# Patient Record
Sex: Female | Born: 1955
Health system: Southern US, Community
[De-identification: ages and names within clinical notes are randomized; demographics above are authoritative.]

## PROBLEM LIST (undated history)

## (undated) DIAGNOSIS — Z5189 Encounter for other specified aftercare: Secondary | ICD-10-CM

## (undated) DIAGNOSIS — R202 Paresthesia of skin: Secondary | ICD-10-CM

## (undated) DIAGNOSIS — M1990 Unspecified osteoarthritis, unspecified site: Secondary | ICD-10-CM

## (undated) DIAGNOSIS — J449 Chronic obstructive pulmonary disease, unspecified: Secondary | ICD-10-CM

## (undated) DIAGNOSIS — I10 Essential (primary) hypertension: Secondary | ICD-10-CM

## (undated) DIAGNOSIS — R200 Anesthesia of skin: Secondary | ICD-10-CM

## (undated) DIAGNOSIS — F419 Anxiety disorder, unspecified: Secondary | ICD-10-CM

## (undated) DIAGNOSIS — L309 Dermatitis, unspecified: Secondary | ICD-10-CM

## (undated) DIAGNOSIS — D649 Anemia, unspecified: Secondary | ICD-10-CM

## (undated) DIAGNOSIS — F32A Depression, unspecified: Secondary | ICD-10-CM

## (undated) DIAGNOSIS — E785 Hyperlipidemia, unspecified: Secondary | ICD-10-CM

## (undated) DIAGNOSIS — F329 Major depressive disorder, single episode, unspecified: Secondary | ICD-10-CM

## (undated) HISTORY — PX: LIPOMA EXCISION: SHX5283

## (undated) HISTORY — DX: Anxiety disorder, unspecified: F41.9

## (undated) HISTORY — DX: Encounter for other specified aftercare: Z51.89

## (undated) HISTORY — PX: COLONOSCOPY: SHX174

## (undated) HISTORY — PX: UPPER GASTROINTESTINAL ENDOSCOPY: SHX188

## (undated) HISTORY — PX: POLYPECTOMY: SHX149

## (undated) HISTORY — DX: Hyperlipidemia, unspecified: E78.5

## (undated) HISTORY — PX: OTHER SURGICAL HISTORY: SHX169

## (undated) HISTORY — PX: ABDOMINAL HYSTERECTOMY: SHX81

## (undated) HISTORY — DX: Anemia, unspecified: D64.9

## (undated) HISTORY — DX: Depression, unspecified: F32.A

## (undated) HISTORY — PX: TIBIA FRACTURE SURGERY: SHX806

## (undated) HISTORY — PX: SALIVARY GLAND SURGERY: SHX768

## (undated) HISTORY — PX: FRACTURE SURGERY: SHX138

---
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
## 1998-06-23 ENCOUNTER — Emergency Department (HOSPITAL_COMMUNITY): Admission: EM | Admit: 1998-06-23 | Discharge: 1998-06-23 | Payer: Self-pay | Admitting: Emergency Medicine

## 1998-06-23 ENCOUNTER — Encounter: Payer: Self-pay | Admitting: Emergency Medicine

## 1998-06-27 ENCOUNTER — Encounter: Payer: Self-pay | Admitting: Orthopedic Surgery

## 1998-06-27 ENCOUNTER — Ambulatory Visit (HOSPITAL_COMMUNITY): Admission: RE | Admit: 1998-06-27 | Discharge: 1998-06-27 | Payer: Self-pay | Admitting: Orthopedic Surgery

## 1999-11-22 ENCOUNTER — Encounter: Payer: Self-pay | Admitting: Family Medicine

## 1999-11-22 ENCOUNTER — Encounter: Admission: RE | Admit: 1999-11-22 | Discharge: 1999-11-22 | Payer: Self-pay | Admitting: Family Medicine

## 1999-12-01 ENCOUNTER — Encounter: Payer: Self-pay | Admitting: Orthopedic Surgery

## 1999-12-01 ENCOUNTER — Inpatient Hospital Stay (HOSPITAL_COMMUNITY): Admission: EM | Admit: 1999-12-01 | Discharge: 1999-12-03 | Payer: Self-pay | Admitting: *Deleted

## 2000-11-13 ENCOUNTER — Other Ambulatory Visit: Admission: RE | Admit: 2000-11-13 | Discharge: 2000-11-13 | Payer: Self-pay | Admitting: Orthopedic Surgery

## 2001-01-14 ENCOUNTER — Encounter: Payer: Self-pay | Admitting: Family Medicine

## 2001-01-14 ENCOUNTER — Encounter: Admission: RE | Admit: 2001-01-14 | Discharge: 2001-01-14 | Payer: Self-pay | Admitting: Family Medicine

## 2001-10-21 ENCOUNTER — Encounter: Admission: RE | Admit: 2001-10-21 | Discharge: 2001-10-21 | Payer: Self-pay | Admitting: Internal Medicine

## 2001-10-21 ENCOUNTER — Encounter: Payer: Self-pay | Admitting: Internal Medicine

## 2002-04-06 ENCOUNTER — Encounter: Payer: Self-pay | Admitting: Internal Medicine

## 2002-04-06 ENCOUNTER — Encounter: Admission: RE | Admit: 2002-04-06 | Discharge: 2002-04-06 | Payer: Self-pay | Admitting: Internal Medicine

## 2002-10-30 ENCOUNTER — Encounter: Payer: Self-pay | Admitting: Emergency Medicine

## 2002-10-30 ENCOUNTER — Emergency Department (HOSPITAL_COMMUNITY): Admission: EM | Admit: 2002-10-30 | Discharge: 2002-10-30 | Payer: Self-pay | Admitting: Emergency Medicine

## 2003-04-10 ENCOUNTER — Encounter: Admission: RE | Admit: 2003-04-10 | Discharge: 2003-04-10 | Payer: Self-pay | Admitting: Family Medicine

## 2003-04-10 ENCOUNTER — Encounter: Payer: Self-pay | Admitting: Family Medicine

## 2003-12-14 ENCOUNTER — Other Ambulatory Visit: Admission: RE | Admit: 2003-12-14 | Discharge: 2003-12-14 | Payer: Self-pay | Admitting: Family Medicine

## 2004-01-22 ENCOUNTER — Encounter: Admission: RE | Admit: 2004-01-22 | Discharge: 2004-04-21 | Payer: Self-pay | Admitting: Family Medicine

## 2004-11-13 ENCOUNTER — Encounter: Admission: RE | Admit: 2004-11-13 | Discharge: 2004-11-13 | Payer: Self-pay | Admitting: Family Medicine

## 2004-12-23 ENCOUNTER — Emergency Department (HOSPITAL_COMMUNITY): Admission: EM | Admit: 2004-12-23 | Discharge: 2004-12-23 | Payer: Self-pay | Admitting: Emergency Medicine

## 2005-09-11 ENCOUNTER — Ambulatory Visit (HOSPITAL_COMMUNITY): Admission: RE | Admit: 2005-09-11 | Discharge: 2005-09-11 | Payer: Self-pay | Admitting: Orthopedic Surgery

## 2005-09-11 ENCOUNTER — Ambulatory Visit (HOSPITAL_BASED_OUTPATIENT_CLINIC_OR_DEPARTMENT_OTHER): Admission: RE | Admit: 2005-09-11 | Discharge: 2005-09-11 | Payer: Self-pay | Admitting: Orthopedic Surgery

## 2006-09-24 ENCOUNTER — Ambulatory Visit: Payer: Self-pay | Admitting: Family Medicine

## 2006-11-26 ENCOUNTER — Ambulatory Visit: Payer: Self-pay | Admitting: Family Medicine

## 2006-12-30 ENCOUNTER — Ambulatory Visit: Payer: Self-pay | Admitting: Family Medicine

## 2007-06-15 ENCOUNTER — Ambulatory Visit: Payer: Self-pay | Admitting: Family Medicine

## 2007-08-26 ENCOUNTER — Ambulatory Visit: Payer: Self-pay | Admitting: Family Medicine

## 2007-09-20 ENCOUNTER — Encounter: Admission: RE | Admit: 2007-09-20 | Discharge: 2007-09-20 | Payer: Self-pay | Admitting: Family Medicine

## 2007-10-27 ENCOUNTER — Ambulatory Visit: Payer: Self-pay | Admitting: Family Medicine

## 2007-11-01 ENCOUNTER — Ambulatory Visit: Payer: Self-pay | Admitting: Family Medicine

## 2007-12-15 ENCOUNTER — Encounter: Admission: RE | Admit: 2007-12-15 | Discharge: 2007-12-15 | Payer: Self-pay | Admitting: General Surgery

## 2007-12-16 ENCOUNTER — Ambulatory Visit (HOSPITAL_BASED_OUTPATIENT_CLINIC_OR_DEPARTMENT_OTHER): Admission: RE | Admit: 2007-12-16 | Discharge: 2007-12-16 | Payer: Self-pay | Admitting: General Surgery

## 2007-12-16 ENCOUNTER — Encounter (INDEPENDENT_AMBULATORY_CARE_PROVIDER_SITE_OTHER): Payer: Self-pay | Admitting: General Surgery

## 2008-04-11 ENCOUNTER — Ambulatory Visit: Payer: Self-pay | Admitting: Family Medicine

## 2008-07-20 ENCOUNTER — Ambulatory Visit: Payer: Self-pay | Admitting: Family Medicine

## 2008-10-05 ENCOUNTER — Ambulatory Visit: Payer: Self-pay | Admitting: Family Medicine

## 2010-05-03 ENCOUNTER — Ambulatory Visit: Payer: Self-pay | Admitting: Family Medicine

## 2010-10-06 ENCOUNTER — Encounter: Payer: Self-pay | Admitting: Family Medicine

## 2010-10-13 ENCOUNTER — Emergency Department (HOSPITAL_COMMUNITY)
Admission: EM | Admit: 2010-10-13 | Discharge: 2010-10-14 | Payer: Self-pay | Source: Home / Self Care | Admitting: Emergency Medicine

## 2010-10-14 LAB — CBC
HCT: 43.3 % (ref 36.0–46.0)
Hemoglobin: 15 g/dL (ref 12.0–15.0)
MCH: 33.8 pg (ref 26.0–34.0)
MCHC: 34.6 g/dL (ref 30.0–36.0)
MCV: 97.5 fL (ref 78.0–100.0)
Platelets: 225 10*3/uL (ref 150–400)
RBC: 4.44 MIL/uL (ref 3.87–5.11)
RDW: 13.2 % (ref 11.5–15.5)
WBC: 7 10*3/uL (ref 4.0–10.5)

## 2010-10-14 LAB — BASIC METABOLIC PANEL
BUN: 7 mg/dL (ref 6–23)
CO2: 24 mEq/L (ref 19–32)
Calcium: 9.5 mg/dL (ref 8.4–10.5)
Chloride: 103 mEq/L (ref 96–112)
Creatinine, Ser: 0.82 mg/dL (ref 0.4–1.2)
GFR calc Af Amer: >60 mL/min (ref >60)
GFR calc non Af Amer: >60 mL/min (ref >60)
Glucose, Bld: 111 mg/dL — ABNORMAL HIGH (ref 70–99)
Potassium: 3.6 mEq/L (ref 3.5–5.1)
Sodium: 137 mEq/L (ref 135–145)

## 2010-10-14 LAB — DIFFERENTIAL
Basophils Absolute: 0.1 10*3/uL (ref 0.0–0.1)
Basophils Relative: 1 % (ref 0–1)
Eosinophils Absolute: 0.2 10*3/uL (ref 0.0–0.7)
Eosinophils Relative: 2 % (ref 0–5)
Lymphocytes Relative: 49 % — ABNORMAL HIGH (ref 12–46)
Lymphs Abs: 3.5 10*3/uL (ref 0.7–4.0)
Monocytes Absolute: 0.5 10*3/uL (ref 0.1–1.0)
Monocytes Relative: 7 % (ref 3–12)
Neutro Abs: 2.9 10*3/uL (ref 1.7–7.7)
Neutrophils Relative %: 41 % — ABNORMAL LOW (ref 43–77)

## 2010-10-14 LAB — BRAIN NATRIURETIC PEPTIDE: Pro B Natriuretic peptide (BNP): <30 pg/mL (ref 0.0–100.0)

## 2010-10-14 LAB — TROPONIN I: Troponin I: 0.02 ng/mL (ref 0.00–0.06)

## 2010-10-14 LAB — CK TOTAL AND CKMB (NOT AT ARMC)
CK, MB: 1.4 ng/mL (ref 0.3–4.0)
Relative Index: 1.3 (ref 0.0–2.5)
Total CK: 105 U/L (ref 7–177)

## 2011-01-28 NOTE — Op Note (Signed)
Meredith Church, Meredith Church               ACCOUNT NO.:  [id number]   MEDICAL RECORD NO.:  [id number]          PATIENT TYPE:  AMB   LOCATION:  DSC                          FACILITY:  [facility]   PHYSICIAN:  Adolph Pollack, M.D.DATE OF BIRTH:  [birth year]   DATE OF PROCEDURE:  12/16/2007  DATE OF DISCHARGE:                               OPERATIVE REPORT   PREOPERATIVE DIAGNOSIS:  Left back soft tissue mass.   POSTOPERATIVE DIAGNOSIS:  Left back soft tissue mass (13 cm x 9 cm).   PROCEDURE:  1. Excision of soft tissue mass left back.  2. Two-layer closure of 13-cm wound.   SURGEON:  Adolph Pollack, M.D.   ANESTHESIA:  General plus Marcaine for local.   INDICATIONS:  This is a 55 year old female who I had seen in May of  2008.  At that time, she had a 9-cm x 9-cm soft tissue mass in the left  upper back that was somewhat symptomatic.  I offered to remove it at  that time.  She then came back in February of this year, wanting  removal, and she has been scheduled for it.  We have discussed the  procedure and risks preoperatively.   TECHNIQUE:  She is seen in the holding area and the mass marked with my  initials.  She was then brought to the operating room and was given a  general anesthetic while supine on the stretcher.  She was then placed  prone on the operating table, and the padding was used at the  appropriate points.  The left upper back area and mid-back were  sterilely prepped and draped.  A transverse incision was made directly  over the mesh and the skin and subcutaneous tissue.  I then used sharp  dissection and electrocautery to free up the soft tissue mass which had  multiple attachments to the subcutaneous tissue.  It went as deep as  some of the fascia over the muscle.  I continued in this fashion in all  directions until I freed the mass up and was able to excise it.  It  measured 13 cm x 9 cm and looked to be lipomatous.   Following this, I inspected the wound and  controlled bleeding with  electrocautery.  I then injected with 0.5% Marcaine solution deep and  superficially into the wound.   The wound was then closed in two layers with a running 3-0 Vicryl suture  through the deep fascial and subcutaneous tissue layer.  The skin was  closed with 3-0 Monocryl subcuticular stitch.  Steri-Strips and a  sterile dressing were applied.   She tolerated the procedure well without any apparent complications and  was taken to recovery in satisfactory condition.      Adolph Pollack, M.D.  Electronically Signed     TJR/MEDQ  D:  12/16/2007  T:  12/16/2007  Job:  [id number]

## 2011-01-31 NOTE — Op Note (Signed)
[name], Meredith Church               ACCOUNT NO.:  [id number]   MEDICAL RECORD NO.:  [id number]          PATIENT TYPE:  AMB   LOCATION:  DSC                          FACILITY:  [facility]   PHYSICIAN:  Cindee Salt, M.D.       DATE OF BIRTH:  Feb 05, 1956   DATE OF PROCEDURE:  09/11/2005  DATE OF DISCHARGE:                                 OPERATIVE REPORT   PREOPERATIVE DIAGNOSIS:  Carpal tunnel syndrome of right hand.   POSTOPERATIVE DIAGNOSIS:  Carpal tunnel syndrome of right hand.   OPERATION:  Decompression of right median nerve.   SURGEON:  Cindee Salt, M.D.   ASSISTANT:  Carolyne Fiscal, R.N.   ANESTHESIA:  General.   HISTORY:  The patient is a 55 year old female with history of carpal tunnel  syndrome. EMG nerve conduction is  positive.  This  has not responded to  conservative treatment.   PROCEDURE:  The patient is brought to the operating room, where a general  anesthetic was carried out without difficulty. She was prepped using  DuraPrep in the supine position, with the right arm free. The limb was  exsanguinated with an Esmarch bandage; tourniquet placed high on the arm was  inflated to 250 mmHg.  A longitudinal incision was made in the palm, carried  down through subcutaneous tissues. Bleeders were electrocauterized.  The  palmar fascia was split.  The superficial palmar arch identified.  The  flexor tendon to the ring and middle finger identified to the ulnar side of  the median nerve.  The carpal retinaculum was incised with sharp dissection.  The right angle and Sewall retractor were placed between skin and forearm  fascia. The fascia released for approximately 1.5 cm proximal to the wrist  crease.  Under direct vision, the canal was explored.  Persistent median  artery was present.  An area of compression to the nerve was apparent. No  further lesions were identified. The wound was irrigated. The skin was  closed with interrupted 5-0 nylon sutures. Sterile compressive dressing  and  splint was applied. The patient tolerated the procedure well, and was taken  to the recovery room for observation in satisfactory condition. She is  discharged home to return to the [facility] in 1 week on  Vicodin.           ______________________________  Cindee Salt, M.D.     GK/MEDQ  D:  09/11/2005  T:  09/11/2005  Job:  [id number]

## 2011-06-10 LAB — DIFFERENTIAL
Basophils Absolute: 0.1
Basophils Relative: 1
Eosinophils Absolute: 0.2
Eosinophils Relative: 3
Lymphocytes Relative: 45
Lymphs Abs: 3.3
Monocytes Absolute: 0.5
Monocytes Relative: 6
Neutro Abs: 3.3
Neutrophils Relative %: 46

## 2011-06-10 LAB — COMPREHENSIVE METABOLIC PANEL
ALT: 31
AST: 25
Albumin: 3.6
Alkaline Phosphatase: 59
BUN: 12
CO2: 29
Calcium: 9.4
Chloride: 104
Creatinine, Ser: 0.74
GFR calc Af Amer: >60
GFR calc non Af Amer: >60
Glucose, Bld: 120 — ABNORMAL HIGH
Potassium: 4.1
Sodium: 139
Total Bilirubin: 0.5
Total Protein: 7.3

## 2011-06-10 LAB — CBC
HCT: 37.7
Hemoglobin: 13
MCHC: 34.5
MCV: 95.7
Platelets: 255
RBC: 3.94
RDW: 14.4
WBC: 7.3

## 2013-09-10 ENCOUNTER — Encounter (HOSPITAL_COMMUNITY): Payer: Self-pay | Admitting: Emergency Medicine

## 2013-09-10 ENCOUNTER — Emergency Department (HOSPITAL_COMMUNITY)
Admission: EM | Admit: 2013-09-10 | Discharge: 2013-09-10 | Disposition: A | Discharge status: Home / Self Care | Payer: Self-pay | Attending: Emergency Medicine | Admitting: Emergency Medicine

## 2013-09-10 DIAGNOSIS — I10 Essential (primary) hypertension: Secondary | ICD-10-CM | POA: Insufficient documentation

## 2013-09-10 DIAGNOSIS — Z79899 Other long term (current) drug therapy: Secondary | ICD-10-CM | POA: Insufficient documentation

## 2013-09-10 DIAGNOSIS — M545 Low back pain, unspecified: Secondary | ICD-10-CM | POA: Insufficient documentation

## 2013-09-10 DIAGNOSIS — F17200 Nicotine dependence, unspecified, uncomplicated: Secondary | ICD-10-CM | POA: Insufficient documentation

## 2013-09-10 DIAGNOSIS — N390 Urinary tract infection, site not specified: Secondary | ICD-10-CM | POA: Insufficient documentation

## 2013-09-10 HISTORY — DX: Essential (primary) hypertension: I10

## 2013-09-10 LAB — URINE MICROSCOPIC-ADD ON

## 2013-09-10 LAB — URINALYSIS, ROUTINE W REFLEX MICROSCOPIC
Bilirubin Urine: NEGATIVE
Glucose, UA: NEGATIVE mg/dL
Ketones, ur: NEGATIVE mg/dL
Nitrite: NEGATIVE
Protein, ur: 100 mg/dL — AB
Specific Gravity, Urine: 1.02 (ref 1.005–1.030)
Urobilinogen, UA: 1 mg/dL (ref 0.0–1.0)
pH: 5.5 (ref 5.0–8.0)

## 2013-09-10 MED ORDER — CEPHALEXIN 500 MG PO CAPS: 500.0000 mg | ORAL_CAPSULE | Freq: Four times a day (QID) | ORAL | Status: DC

## 2013-09-10 MED ORDER — PHENAZOPYRIDINE HCL 200 MG PO TABS
200.0000 mg | ORAL_TABLET | Freq: Three times a day (TID) | ORAL | Status: DC
  Administered 2013-09-10: 200 mg via ORAL
  Filled 2013-09-10: qty 1

## 2013-09-10 MED ORDER — PHENAZOPYRIDINE HCL 200 MG PO TABS: 200.0000 mg | ORAL_TABLET | Freq: Three times a day (TID) | ORAL | Status: DC

## 2013-09-10 MED ORDER — LIDOCAINE HCL 1 % IJ SOLN
INTRAMUSCULAR | Status: AC
  Administered 2013-09-10: 2.1 mL
  Filled 2013-09-10: qty 20

## 2013-09-10 MED ORDER — CEFTRIAXONE SODIUM 1 G IJ SOLR
1.0000 g | Freq: Once | INTRAMUSCULAR | Status: AC
  Administered 2013-09-10: 1 g via INTRAMUSCULAR
  Filled 2013-09-10: qty 10

## 2013-09-10 NOTE — ED Notes (Signed)
Pt c/o hematuria and flank pain x a few days.  States that she has had a kidney infection before and it felt similar.

## 2013-09-10 NOTE — ED Provider Notes (Signed)
CSN: [id number]     Arrival date & time 09/10/13  1207 History   First MD Initiated Contact with Patient 09/10/13 1402     Chief Complaint  Patient presents with  . Hematuria    HPI Patient reports hematuria and dysuria over the past several days.  No nausea or vomiting.  She denies fever.  Her symptoms are mild in severity.  She reports mild low back pain but denies unilateral flank pain.  States a history of prior urinary tract infection and reports this feels very similar.  Nothing worsens or improves her symptoms.   Past Medical History  Diagnosis Date  . Hypertension    Past Surgical History  Procedure Laterality Date  . Tibia fracture surgery    . Abdominal hysterectomy     No family history on file. History  Substance Use Topics  . Smoking status: Current Some Day Smoker -- 0.01 packs/day    Types: Cigarettes  . Smokeless tobacco: Never Used  . Alcohol Use: Yes     Comment: Socially    OB History   Grav Para Term Preterm Abortions TAB SAB Ect Mult Living                 Review of Systems  All other systems reviewed and are negative.    Allergies  Review of patient's allergies indicates no known allergies.  Home Medications   Current Outpatient Rx  Name  Route  Sig  Dispense  Refill  . ALPRAZolam (XANAX) 0.5 MG tablet   Oral   Take 0.5 mg by mouth daily as needed for anxiety.         . Biotin 1000 MCG tablet   Oral   Take 2,000 mcg by mouth daily.         . L-FORMULA LYSINE HCL PO   Oral   Take 1 tablet by mouth daily.         Marland Kitchen lisinopril (PRINIVIL,ZESTRIL) 2.5 MG tablet   Oral   Take 2.5 mg by mouth daily.         . metoprolol succinate (TOPROL-XL) 50 MG 24 hr tablet   Oral   Take 50 mg by mouth daily. Take with or immediately following a meal.         . vitamin C (ASCORBIC ACID) 500 MG tablet   Oral   Take 500 mg by mouth daily.         . cephALEXin (KEFLEX) 500 MG capsule   Oral   Take 1 capsule (500 mg total) by mouth 4  (four) times daily.   28 capsule   0   . phenazopyridine (PYRIDIUM) 200 MG tablet   Oral   Take 1 tablet (200 mg total) by mouth 3 (three) times daily.   6 tablet   0    BP 138/79  Pulse 89  Temp(Src) 98.8 F (37.1 C) (Oral)  Resp 16  SpO2 97% Physical Exam  Nursing note and vitals reviewed. Constitutional: She is oriented to person, place, and time. She appears well-developed and well-nourished. No distress.  HENT:  Head: Normocephalic and atraumatic.  Eyes: EOM are normal.  Neck: Normal range of motion.  Cardiovascular: Normal rate, regular rhythm and normal heart sounds.   Pulmonary/Chest: Effort normal and breath sounds normal.  Abdominal: Soft. She exhibits no distension. There is no tenderness.  Musculoskeletal: Normal range of motion.  Neurological: She is alert and oriented to person, place, and time.  Skin: Skin is warm and  dry.  Psychiatric: She has a normal mood and affect. Judgment normal.    ED Course  Procedures (including critical care time) Labs Review Labs Reviewed  URINALYSIS, ROUTINE W REFLEX MICROSCOPIC - Abnormal; Notable for the following:    Color, Urine RED (*)    APPearance TURBID (*)    Hgb urine dipstick LARGE (*)    Protein, ur 100 (*)    Leukocytes, UA LARGE (*)    All other components within normal limits  URINE MICROSCOPIC-ADD ON - Abnormal; Notable for the following:    Squamous Epithelial / LPF FEW (*)    Bacteria, UA FEW (*)    All other components within normal limits  URINE CULTURE   Imaging Review No results found.  EKG Interpretation   None       MDM   1. Urinary tract infection    UTI without signs of pyelo. Dc home. Vitals normal. Return instructions given    Lyanne Co, MD 09/10/13 1435

## 2013-09-11 LAB — URINE CULTURE: Colony Count: 4000

## 2013-10-03 ENCOUNTER — Ambulatory Visit: Payer: 59 | Attending: Internal Medicine | Admitting: Internal Medicine

## 2013-10-03 ENCOUNTER — Other Ambulatory Visit: Payer: Self-pay | Admitting: Emergency Medicine

## 2013-10-03 ENCOUNTER — Other Ambulatory Visit: Payer: Self-pay

## 2013-10-03 ENCOUNTER — Encounter: Payer: Self-pay | Admitting: Internal Medicine

## 2013-10-03 VITALS — BP 172/116 | HR 93 | Temp 99.0°F | Resp 14 | Ht 64.0 in | Wt 193.6 lb

## 2013-10-03 DIAGNOSIS — I10 Essential (primary) hypertension: Secondary | ICD-10-CM | POA: Insufficient documentation

## 2013-10-03 DIAGNOSIS — E7800 Pure hypercholesterolemia, unspecified: Secondary | ICD-10-CM

## 2013-10-03 DIAGNOSIS — E785 Hyperlipidemia, unspecified: Secondary | ICD-10-CM | POA: Insufficient documentation

## 2013-10-03 LAB — POCT GLYCOSYLATED HEMOGLOBIN (HGB A1C): Hemoglobin A1C: 5.9

## 2013-10-03 LAB — CBC WITH DIFFERENTIAL/PLATELET
Basophils Absolute: 0 10*3/uL (ref 0.0–0.1)
Basophils Relative: 1 % (ref 0–1)
Eosinophils Absolute: 0.1 10*3/uL (ref 0.0–0.7)
Eosinophils Relative: 3 % (ref 0–5)
HCT: 41.7 % (ref 36.0–46.0)
Hemoglobin: 14.2 g/dL (ref 12.0–15.0)
Lymphocytes Relative: 46 % (ref 12–46)
Lymphs Abs: 2.2 10*3/uL (ref 0.7–4.0)
MCH: 34.1 pg — ABNORMAL HIGH (ref 26.0–34.0)
MCHC: 34.1 g/dL (ref 30.0–36.0)
MCV: 100.2 fL — ABNORMAL HIGH (ref 78.0–100.0)
Monocytes Absolute: 0.5 10*3/uL (ref 0.1–1.0)
Monocytes Relative: 10 % (ref 3–12)
Neutro Abs: 2 10*3/uL (ref 1.7–7.7)
Neutrophils Relative %: 40 % — ABNORMAL LOW (ref 43–77)
Platelets: 225 10*3/uL (ref 150–400)
RBC: 4.16 MIL/uL (ref 3.87–5.11)
RDW: 13.7 % (ref 11.5–15.5)
WBC: 4.8 10*3/uL (ref 4.0–10.5)

## 2013-10-03 MED ORDER — PRAVASTATIN SODIUM 40 MG PO TABS: 40.0000 mg | ORAL_TABLET | Freq: Every day | ORAL | Status: DC

## 2013-10-03 MED ORDER — METOPROLOL TARTRATE 50 MG PO TABS: 50.0000 mg | ORAL_TABLET | Freq: Two times a day (BID) | ORAL | Status: DC

## 2013-10-03 MED ORDER — METOPROLOL SUCCINATE ER 50 MG PO TB24: 50.0000 mg | ORAL_TABLET | Freq: Every day | ORAL | Status: DC

## 2013-10-03 MED ORDER — LISINOPRIL 20 MG PO TABS: 20.0000 mg | ORAL_TABLET | Freq: Every day | ORAL | Status: DC

## 2013-10-03 MED ORDER — METOPROLOL SUCCINATE ER 50 MG PO TB24
50.0000 mg | ORAL_TABLET | Freq: Every day | ORAL | Status: DC
Start: 2013-10-03 — End: 2013-10-03

## 2013-10-03 MED ORDER — ALPRAZOLAM 0.5 MG PO TABS: 0.5000 mg | ORAL_TABLET | Freq: Every day | ORAL | Status: DC | PRN

## 2013-10-03 NOTE — Progress Notes (Signed)
Pt is here to establish care. Requests some blood work. Pt needs assistance to keep BP regulated.

## 2013-10-03 NOTE — Progress Notes (Signed)
Patient ID: Meredith Church, female   DOB: 03/09/1956, 58 y.o.   MRN: [id number]   CC:  HPI: 58 year old female here to establish care. She has no history of hypertension, previously seen by the [facility]. She has been on metoprolol XL a little nd lisinopril for 2 years. She previously used to see Dr. Mardelle Matte has obesity and lost her insurance. She denies any ongoing chest pain shortness of breath. She status post hysterectomy her last Pap smear was 2 years ago She also has had dyslipidemia.  Social history she smokes occasionally, drinks socially, works at a [employer] and find a job very stressful  Family history positive for coronary artery disease and diabetes in her mother and her sister Father is deceased and she was 14 years old      No Known Allergies Past Medical History  Diagnosis Date  . Hypertension    Current Outpatient Prescriptions on File Prior to Visit  Medication Sig Dispense Refill  . Biotin 1000 MCG tablet Take 2,000 mcg by mouth daily.      . cephALEXin (KEFLEX) 500 MG capsule Take 1 capsule (500 mg total) by mouth 4 (four) times daily.  28 capsule  0  . L-FORMULA LYSINE HCL PO Take 1 tablet by mouth daily.      . phenazopyridine (PYRIDIUM) 200 MG tablet Take 1 tablet (200 mg total) by mouth 3 (three) times daily.  6 tablet  0  . vitamin C (ASCORBIC ACID) 500 MG tablet Take 500 mg by mouth daily.       No current facility-administered medications on file prior to visit.   History reviewed. No pertinent family history. History   Social History  . Marital Status: Single    Spouse Name: N/A    Number of Children: N/A  . Years of Education: N/A   Occupational History  . Not on file.   Social History Main Topics  . Smoking status: Current Some Day Smoker -- 0.01 packs/day    Types: Cigarettes  . Smokeless tobacco: Never Used  . Alcohol Use: Yes     Comment: Socially   . Drug Use: No  . Sexual Activity: Not on file   Other Topics Concern  .  Not on file   Social History Narrative  . No narrative on file    Review of Systems  Constitutional: Negative for fever, chills, diaphoresis, activity change, appetite change and fatigue.  HENT: Negative for ear pain, nosebleeds, congestion, facial swelling, rhinorrhea, neck pain, neck stiffness and ear discharge.   Eyes: Negative for pain, discharge, redness, itching and visual disturbance.  Respiratory: Negative for cough, choking, chest tightness, shortness of breath, wheezing and stridor.   Cardiovascular: Negative for chest pain, palpitations and leg swelling.  Gastrointestinal: Negative for abdominal distention.  Genitourinary: Negative for dysuria, urgency, frequency, hematuria, flank pain, decreased urine volume, difficulty urinating and dyspareunia.  Musculoskeletal: Negative for back pain, joint swelling, arthralgias and gait problem.  Neurological: Negative for dizziness, tremors, seizures, syncope, facial asymmetry, speech difficulty, weakness, light-headedness, numbness and headaches.  Hematological: Negative for adenopathy. Does not bruise/bleed easily.  Psychiatric/Behavioral: Negative for hallucinations, behavioral problems, confusion, dysphoric mood, decreased concentration and agitation.    Objective:   Filed Vitals:   10/03/13 1356  BP: 172/116  Pulse: 93  Temp: 99 F (37.2 C)  Resp: 14    Physical Exam  Constitutional: Appears well-developed and well-nourished. No distress.  HENT: Normocephalic. External right and left ear normal. Oropharynx is clear and  moist.  Eyes: Conjunctivae and EOM are normal. PERRLA, no scleral icterus.  Neck: Normal ROM. Neck supple. No JVD. No tracheal deviation. No thyromegaly.  CVS: RRR, S1/S2 +, no murmurs, no gallops, no carotid bruit.  Pulmonary: Effort and breath sounds normal, no stridor, rhonchi, wheezes, rales.  Abdominal: Soft. BS +,  no distension, tenderness, rebound or guarding.  Musculoskeletal: Normal range of  motion. No edema and no tenderness.  Lymphadenopathy: No lymphadenopathy noted, cervical, inguinal. Neuro: Alert. Normal reflexes, muscle tone coordination. No cranial nerve deficit. Skin: Skin is warm and dry. No rash noted. Not diaphoretic. No erythema. No pallor.  Psychiatric: Normal mood and affect. Behavior, judgment, thought content normal.   Lab Results  Component Value Date   WBC 7.0 10/13/2010   HGB 15.0 10/13/2010   HCT 43.3 10/13/2010   MCV 97.5 10/13/2010   PLT 225 10/13/2010   Lab Results  Component Value Date   CREATININE 0.82 10/13/2010   BUN 7 10/13/2010   NA 137 10/13/2010   K 3.6 10/13/2010   CL 103 10/13/2010   CO2 24 10/13/2010    No results found for this basename: HGBA1C   Lipid Panel  No results found for this basename: chol, trig, hdl, cholhdl, vldl, ldlcalc       Assessment and plan:   There are no active problems to display for this patient.      Hypertension Increase lisinopril to 20 mg Continue metoprolol RN visit in 2 weeks for a blood pressure check  Dyslipidemia continue pravastatin  Establish care Referral for colonoscopy provided Mammogram scheduled No indication for Pap smear the patient is status post hysterectomy  Follow up in 3 months The patient was given clear instructions to go to ER or return to medical center if symptoms don't improve, worsen or new problems develop. The patient verbalized understanding. The patient was told to call to get any lab results if not heard anything in the next week.

## 2013-10-04 ENCOUNTER — Telehealth: Payer: Self-pay | Admitting: *Deleted

## 2013-10-04 LAB — COMPLETE METABOLIC PANEL WITH GFR
ALT: 24 U/L (ref 0–35)
AST: 26 U/L (ref 0–37)
Albumin: 4.2 g/dL (ref 3.5–5.2)
Alkaline Phosphatase: 68 U/L (ref 39–117)
BUN: 11 mg/dL (ref 6–23)
CO2: 26 mEq/L (ref 19–32)
Calcium: 9.2 mg/dL (ref 8.4–10.5)
Chloride: 106 mEq/L (ref 96–112)
Creat: 0.53 mg/dL (ref 0.50–1.10)
GFR, Est African American: >89 mL/min
GFR, Est Non African American: >89 mL/min
Glucose, Bld: 145 mg/dL — ABNORMAL HIGH (ref 70–99)
Potassium: 3.9 mEq/L (ref 3.5–5.3)
Sodium: 142 mEq/L (ref 135–145)
Total Bilirubin: 0.4 mg/dL (ref 0.3–1.2)
Total Protein: 7.5 g/dL (ref 6.0–8.3)

## 2013-10-04 LAB — LIPID PANEL
Cholesterol: 188 mg/dL (ref 0–200)
HDL: 42 mg/dL (ref >39)
LDL Cholesterol: 93 mg/dL (ref 0–99)
Total CHOL/HDL Ratio: 4.5 Ratio
Triglycerides: 267 mg/dL — ABNORMAL HIGH (ref <150)
VLDL: 53 mg/dL — ABNORMAL HIGH (ref 0–40)

## 2013-10-04 LAB — TSH: TSH: 0.936 u[IU]/mL (ref 0.350–4.500)

## 2013-10-04 LAB — VITAMIN D 25 HYDROXY (VIT D DEFICIENCY, FRACTURES): Vit D, 25-Hydroxy: 10 ng/mL — ABNORMAL LOW (ref 30–89)

## 2013-10-04 MED ORDER — VITAMIN D (ERGOCALCIFEROL) 1.25 MG (50000 UNIT) PO CAPS: 50000.0000 [IU] | ORAL_CAPSULE | ORAL | Status: DC

## 2013-10-04 NOTE — Telephone Encounter (Signed)
Message copied by Bethann Qualley, [place] R on Tue Oct 04, 2013  2:38 PM ------      Message from: Allyson Sabal MD, [facility]      Created: Tue Oct 04, 2013 10:53 AM       Notify patient of vitamin D. level is 10. Please call in a prescription for vitamin D 50,000 units weekly, 10 tablets with 2 refills.      Patient also has elevated MCV, schedule patient for folic acid level and vitamin B 12 level as a lab. ------

## 2013-10-04 NOTE — Telephone Encounter (Signed)
Tried contacting pt, mobile number was unreachable. Already sent prescription to the pharmacy. Pt started to become very rude and was sent to talk to RN instead. RN handled the situation with the pt.

## 2013-10-14 ENCOUNTER — Other Ambulatory Visit: Payer: 59

## 2013-10-18 ENCOUNTER — Encounter: Payer: Self-pay | Admitting: Gastroenterology

## 2013-11-02 ENCOUNTER — Other Ambulatory Visit: Payer: Self-pay | Admitting: Internal Medicine

## 2013-11-02 DIAGNOSIS — Z1231 Encounter for screening mammogram for malignant neoplasm of breast: Secondary | ICD-10-CM

## 2013-11-02 DIAGNOSIS — N644 Mastodynia: Secondary | ICD-10-CM

## 2013-11-15 ENCOUNTER — Ambulatory Visit (AMBULATORY_SURGERY_CENTER): Payer: Self-pay

## 2013-11-15 ENCOUNTER — Encounter: Payer: 59 | Admitting: Gastroenterology

## 2013-11-15 VITALS — Ht 64.0 in | Wt 193.4 lb

## 2013-11-15 DIAGNOSIS — Z1211 Encounter for screening for malignant neoplasm of colon: Secondary | ICD-10-CM

## 2013-11-15 MED ORDER — MOVIPREP 100 G PO SOLR
ORAL | Status: DC
Start: 2013-11-15 — End: 2013-11-18

## 2013-11-18 ENCOUNTER — Ambulatory Visit: Payer: 59

## 2013-11-18 ENCOUNTER — Ambulatory Visit (AMBULATORY_SURGERY_CENTER): Payer: 59 | Admitting: Gastroenterology

## 2013-11-18 ENCOUNTER — Encounter: Payer: Self-pay | Admitting: Gastroenterology

## 2013-11-18 VITALS — BP 177/104 | HR 72 | Temp 97.4°F | Resp 18 | Ht 64.0 in | Wt 193.0 lb

## 2013-11-18 DIAGNOSIS — D378 Neoplasm of uncertain behavior of other specified digestive organs: Secondary | ICD-10-CM

## 2013-11-18 DIAGNOSIS — K5730 Diverticulosis of large intestine without perforation or abscess without bleeding: Secondary | ICD-10-CM

## 2013-11-18 DIAGNOSIS — D375 Neoplasm of uncertain behavior of rectum: Secondary | ICD-10-CM

## 2013-11-18 DIAGNOSIS — Z1211 Encounter for screening for malignant neoplasm of colon: Secondary | ICD-10-CM

## 2013-11-18 DIAGNOSIS — D126 Benign neoplasm of colon, unspecified: Secondary | ICD-10-CM

## 2013-11-18 DIAGNOSIS — D371 Neoplasm of uncertain behavior of stomach: Secondary | ICD-10-CM

## 2013-11-18 DIAGNOSIS — R933 Abnormal findings on diagnostic imaging of other parts of digestive tract: Secondary | ICD-10-CM

## 2013-11-18 MED ORDER — SODIUM CHLORIDE 0.9 % IV SOLN: 500.0000 mL | INTRAVENOUS | Status: DC

## 2013-11-18 NOTE — Progress Notes (Signed)
Called to room to assist during endoscopic procedure.  Patient ID and intended procedure confirmed with present staff. Received instructions for my participation in the procedure from the performing physician.  

## 2013-11-18 NOTE — Progress Notes (Signed)
Patient denies any allergies to eggs or soy. Patient denies any problems with anesthesia.  

## 2013-11-18 NOTE — Progress Notes (Signed)
Procedure ends, to recovery, report given and VSS. 

## 2013-11-18 NOTE — Patient Instructions (Addendum)
One of your biggest health concerns is your smoking.  This increases your risk for most cancers and serious cardiovascular diseases such as strokes, heart attacks.  You should try your best to stop.  If you need assistance, please contact your PCP or Smoking Cessation Class at [facility] [phone]) or [place] (1-800-QUIT-NOW).  Discharge instructions given with verbal understanding. Handouts on polyps and diverticulosis. Resume previous medications. YOU HAD AN ENDOSCOPIC PROCEDURE TODAY AT [facility]: Refer to the procedure report that was given to you for any specific questions about what was found during the examination.  If the procedure report does not answer your questions, please call your gastroenterologist to clarify.  If you requested that your care partner not be given the details of your procedure findings, then the procedure report has been included in a sealed envelope for you to review at your convenience later.  YOU SHOULD EXPECT: Some feelings of bloating in the abdomen. Passage of more gas than usual.  Walking can help get rid of the air that was put into your GI tract during the procedure and reduce the bloating. If you had a lower endoscopy (such as a colonoscopy or flexible sigmoidoscopy) you may notice spotting of blood in your stool or on the toilet paper. If you underwent a bowel prep for your procedure, then you may not have a normal bowel movement for a few days.  DIET: Your first meal following the procedure should be a light meal and then it is ok to progress to your normal diet.  A half-sandwich or bowl of soup is an example of a good first meal.  Heavy or fried foods are harder to digest and may make you feel nauseous or bloated.  Likewise meals heavy in dairy and vegetables can cause extra gas to form and this can also increase the bloating.  Drink plenty of fluids but you should avoid alcoholic beverages for 24 hours.  ACTIVITY: Your  care partner should take you home directly after the procedure.  You should plan to take it easy, moving slowly for the rest of the day.  You can resume normal activity the day after the procedure however you should NOT DRIVE or use heavy machinery for 24 hours (because of the sedation medicines used during the test).    SYMPTOMS TO REPORT IMMEDIATELY: A gastroenterologist can be reached at any hour.  During normal business hours, 8:30 AM to 5:00 PM Monday through Friday, call [phone].  After hours and on weekends, please call the GI answering service at [phone] who will take a message and have the physician on call contact you.   Following lower endoscopy (colonoscopy or flexible sigmoidoscopy):  Excessive amounts of blood in the stool  Significant tenderness or worsening of abdominal pains  Swelling of the abdomen that is new, acute  Fever of 100F or higher  FOLLOW UP: If any biopsies were taken you will be contacted by phone or by letter within the next 1-3 weeks.  Call your gastroenterologist if you have not heard about the biopsies in 3 weeks.  Our staff will call the home number listed on your records the next business day following your procedure to check on you and address any questions or concerns that you may have at that time regarding the information given to you following your procedure. This is a courtesy call and so if there is no answer at the home number and we have not heard from you  through the emergency physician on call, we will assume that you have returned to your regular daily activities without incident.  SIGNATURES/CONFIDENTIALITY: You and/or your care partner have signed paperwork which will be entered into your electronic medical record.  These signatures attest to the fact that that the information above on your After Visit Summary has been reviewed and is understood.  Full responsibility of the confidentiality of this discharge information lies with you  and/or your care-partner.

## 2013-11-18 NOTE — Op Note (Signed)
[facility]  [address] [place], [id number]   COLONOSCOPY PROCEDURE REPORT  PATIENT: Meredith Church, Meredith Church  MR#: [id number] BIRTHDATE: [birth year] , 58  yrs. old GENDER: Female ENDOSCOPIST: Milus Banister, MD REFERRED VF:IEPPIRJJOA Doreene Burke, M.D. PROCEDURE DATE:  11/18/2013 PROCEDURE:   Colonoscopy with snare polypectomy First Screening Colonoscopy - Avg.  risk and is 50 yrs.  old or older Yes.  Prior Negative Screening - Now for repeat screening. N/A  History of Adenoma - Now for follow-up colonoscopy & has been > or = to 3 yrs.  N/A  Polyps Removed Today? Yes. ASA CLASS:   Class II INDICATIONS:average risk screening. MEDICATIONS: MAC sedation, administered by CRNA and propofol (Diprivan) 250mg  IV  DESCRIPTION OF PROCEDURE:   After the risks benefits and alternatives of the procedure were thoroughly explained, informed consent was obtained.  A digital rectal exam revealed no abnormalities of the rectum.   The LB [id number]  endoscope was introduced through the anus and advanced to the cecum, which was identified by both the appendix and ileocecal valve. No adverse events experienced.   The quality of the prep was excellent.  The instrument was then slowly withdrawn as the colon was fully examined.  COLON FINDINGS: Two polyps were found, removed and sent to pathology.  One was round, firm, located at hepatic flexure, seemed submucosal, 1.2cm across, this was removed with snare/cautery.  The other was more typical appearing, 66mm across, sessile, located in sigmoid colon, removed with cold snare.  There were diverticulum in the left colon.  The examination was otherwise normal.  Retroflexed views revealed no abnormalities. The time to cecum=2 minutes 15 seconds.  Withdrawal time=11 minutes 09 seconds.  The scope was withdrawn and the procedure completed. COMPLICATIONS: There were no complications.  ENDOSCOPIC IMPRESSION: Two polyps were found, removed and  sent to pathology. There were diverticulum in the left colon. The examination was otherwise normal.  RECOMMENDATIONS: If the polyp(s) removed today are proven to be adenomatous (pre-cancerous) polyps, you will need a colonoscopy in 3-5 years. Otherwise you should continue to follow colorectal cancer screening guidelines for "routine risk" patients with a colonoscopy in 10 years.  You will receive a letter within 1-2 weeks with the results of your biopsy as well as final recommendations.  Please call my office if you have not received a letter after 3 weeks.   eSigned:  Milus Banister, MD 11/18/2013 11:03 AM

## 2013-11-21 ENCOUNTER — Telehealth: Payer: Self-pay | Admitting: *Deleted

## 2013-11-21 NOTE — Telephone Encounter (Signed)
  Follow up Call-  Call back number 11/18/2013  Post procedure Call Back phone  # [phone]  Permission to leave phone message Yes     Patient questions:  Do you have a fever, pain , or abdominal swelling? no Pain Score  0 *  Have you tolerated food without any problems? yes  Have you been able to return to your normal activities? yes  Do you have any questions about your discharge instructions: Diet   no Medications  no Follow up visit  no  Do you have questions or concerns about your Care? no  Actions: * If pain score is 4 or above: No action needed, pain <4.

## 2013-11-25 ENCOUNTER — Encounter: Payer: Self-pay | Admitting: Gastroenterology

## 2013-12-05 ENCOUNTER — Ambulatory Visit
Admission: RE | Admit: 2013-12-05 | Discharge: 2013-12-05 | Disposition: A | Discharge status: Home / Self Care | Payer: 59 | Source: Ambulatory Visit | Attending: Internal Medicine | Admitting: Internal Medicine

## 2013-12-05 DIAGNOSIS — Z1231 Encounter for screening mammogram for malignant neoplasm of breast: Secondary | ICD-10-CM

## 2013-12-07 ENCOUNTER — Ambulatory Visit: Payer: 59 | Attending: Internal Medicine

## 2013-12-07 DIAGNOSIS — Z0000 Encounter for general adult medical examination without abnormal findings: Secondary | ICD-10-CM

## 2013-12-09 ENCOUNTER — Ambulatory Visit: Payer: 59 | Attending: Internal Medicine | Admitting: Internal Medicine

## 2013-12-09 DIAGNOSIS — Z9289 Personal history of other medical treatment: Secondary | ICD-10-CM

## 2013-12-12 ENCOUNTER — Telehealth: Payer: Self-pay | Admitting: Internal Medicine

## 2013-12-12 LAB — QUANTIFERON TB GOLD ASSAY (BLOOD)
Interferon Gamma Release Assay: NEGATIVE
Mitogen value: 9.85 IU/mL
Quantiferon Nil Value: 0.11 IU/mL
Quantiferon Tb Ag Minus Nil Value: 0 IU/mL
TB Ag value: 0.04 IU/mL

## 2013-12-13 ENCOUNTER — Telehealth: Payer: Self-pay | Admitting: Internal Medicine

## 2013-12-13 NOTE — Telephone Encounter (Signed)
Pt has come in to get the results of her TB test done on 3/27; please f/u with pt [phone]

## 2013-12-26 ENCOUNTER — Telehealth: Payer: Self-pay

## 2013-12-26 NOTE — Telephone Encounter (Signed)
Spoke with patient-she wanted to speak with  Dr Allyson Sabal but i explained to her that she was gone for the day

## 2014-01-10 ENCOUNTER — Ambulatory Visit: Payer: 59 | Admitting: Family Medicine

## 2014-01-11 ENCOUNTER — Encounter: Payer: Self-pay | Admitting: Family Medicine

## 2014-01-11 ENCOUNTER — Ambulatory Visit: Payer: 59 | Attending: Internal Medicine | Admitting: Family Medicine

## 2014-01-11 VITALS — BP 162/102 | HR 78 | Temp 98.0°F | Ht 64.0 in | Wt 189.8 lb

## 2014-01-11 DIAGNOSIS — R609 Edema, unspecified: Secondary | ICD-10-CM | POA: Insufficient documentation

## 2014-01-11 DIAGNOSIS — R220 Localized swelling, mass and lump, head: Secondary | ICD-10-CM

## 2014-01-11 DIAGNOSIS — F411 Generalized anxiety disorder: Secondary | ICD-10-CM | POA: Insufficient documentation

## 2014-01-11 DIAGNOSIS — I10 Essential (primary) hypertension: Secondary | ICD-10-CM | POA: Insufficient documentation

## 2014-01-11 DIAGNOSIS — R221 Localized swelling, mass and lump, neck: Secondary | ICD-10-CM

## 2014-01-11 MED ORDER — AMLODIPINE BESYLATE 5 MG PO TABS: 5.0000 mg | ORAL_TABLET | Freq: Every day | ORAL | Status: DC

## 2014-01-11 MED ORDER — ALPRAZOLAM 0.5 MG PO TABS: 0.5000 mg | ORAL_TABLET | Freq: Every day | ORAL | Status: DC | PRN

## 2014-01-11 NOTE — Patient Instructions (Signed)
Thank you for coming in today.  Hypertension:  I am going to put you on new medication called amlodipine take this 1 time a day.  Take your Metoporol every 12 hours  Our goal for your BP is 140/95   Neck Swelling:  Try eating sour candy or lemon drops, this will help produce saliva which should help with the stone.  If this does not get better in 2 weeks call back so we can put a ENT referral in for you.   I will refill your Xanax for 1 month, you will need to come back to be evaluated for any further refills.  Come back in 2 weeks for a BP recheck with nurse.

## 2014-01-11 NOTE — Assessment & Plan Note (Signed)
Patient asked for refill on xanax that was prescribed last visit.  Was at end of our visit.  Will do one rx and advised her to return to discuss further

## 2014-01-11 NOTE — Assessment & Plan Note (Signed)
Given sudden onset and history of salivary stone will treat for obstruction with sour candies.  No signs of infection or cancer.

## 2014-01-11 NOTE — Progress Notes (Signed)
   Subjective:    Patient ID: Meredith Church, female    DOB: 02/16/1956, 58 y.o.   MRN: [id number]  HPI Neck swelling  Problem began 4 days ago Progression: getting worse Medications tried: none Anything improved it: heating pad Anything worsen it: nothing Had similar problem before: did have a submandibular stone removed years ago No problem swallowing or lip swelling.  Soft tissue swelling is focal over R jaw angle.  Mildly tender No ear pain or discharge    HYPERTENSION Home BP readings: 130/85 Chest Pain: no Lightheadedness or Syncope: no Leg Swelling: no  Medications When took last medication:  This morning Misses taking medications:  Rarely  Does not always take metoprolol every 12 hours   Diet Ability to limit unhealthy foods:  yes  Exercise Frequency: never   Monitoring Labs and Parameters Last A1C:  Lab Results  Component Value Date   HGBA1C 5.9 10/03/2013    Last Lipid:     Component Value Date/Time   CHOL 188 10/03/2013 1415   HDL 42 10/03/2013 1415    Last Bmet  Potassium  Date Value Ref Range Status  10/03/2013 3.9  3.5 - 5.3 mEq/L Final     Sodium  Date Value Ref Range Status  10/03/2013 142  135 - 145 mEq/L Final     Creat  Date Value Ref Range Status  10/03/2013 0.53  0.50 - 1.10 mg/dL Final     Creatinine, Ser  Date Value Ref Range Status  10/13/2010 0.82  0.4 - 1.2 mg/dL Final      Last BPs:  BP Readings from Last 3 Encounters:  01/11/14 162/102  11/18/13 177/104  10/03/13 172/116    Weight history:  Wt Readings from Last 3 Encounters:  01/11/14 189 lb 12.8 oz (86.093 kg)  11/18/13 193 lb (87.544 kg)  11/15/13 193 lb 6.4 oz (87.726 kg)      Review of Symptoms - see HPI PMH - Smoking status noted.      Review of Systems        Objective:   Physical Exam  Heart - Regular rate and rhythm.  No murmurs, gallops or rubs.    Lungs:  Normal respiratory effort, chest expands symmetrically. Lungs are clear to  auscultation, no crackles or wheezes. Extremities:  No cyanosis, edema, or deformity noted with good range of motion of all major joints.   Face: indistinct swelling on right jaw over parotid area. Nontender nonfluctuant Ears:  External ear exam shows no significant lesions or deformities.  Otoscopic examination reveals clear canals, tympanic membranes are intact bilaterally without bulging, retraction, inflammation or discharge. Hearing is grossly normal bilaterall      Assessment & Plan:

## 2014-01-11 NOTE — Assessment & Plan Note (Signed)
Not under control will add amlodipine an dhave her monitor at home.  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

## 2014-01-13 ENCOUNTER — Telehealth: Payer: Self-pay | Admitting: Family Medicine

## 2014-01-13 NOTE — Telephone Encounter (Signed)
Called follow up on face swelling.  No worse maybe slightly better.  No fever or pain.  Recommend continue with candies and give another week or so.  If no better call and may need ENT referral

## 2014-01-30 ENCOUNTER — Other Ambulatory Visit: Payer: Self-pay | Admitting: Internal Medicine

## 2014-01-30 DIAGNOSIS — I10 Essential (primary) hypertension: Secondary | ICD-10-CM

## 2014-01-30 DIAGNOSIS — E7800 Pure hypercholesterolemia, unspecified: Secondary | ICD-10-CM

## 2014-02-21 ENCOUNTER — Ambulatory Visit: Payer: 59 | Admitting: Internal Medicine

## 2014-04-07 ENCOUNTER — Other Ambulatory Visit: Payer: Self-pay | Admitting: Emergency Medicine

## 2014-04-07 ENCOUNTER — Other Ambulatory Visit: Payer: Self-pay | Admitting: Family Medicine

## 2014-04-07 ENCOUNTER — Other Ambulatory Visit: Payer: Self-pay | Admitting: Internal Medicine

## 2014-04-07 MED ORDER — AMLODIPINE BESYLATE 5 MG PO TABS
5.0000 mg | ORAL_TABLET | Freq: Every day | ORAL | Status: DC
Start: 2014-04-07 — End: 2014-06-27

## 2014-05-18 ENCOUNTER — Other Ambulatory Visit: Payer: Self-pay | Admitting: Internal Medicine

## 2014-06-26 ENCOUNTER — Other Ambulatory Visit: Payer: Self-pay | Admitting: Internal Medicine

## 2014-06-27 ENCOUNTER — Other Ambulatory Visit: Payer: Self-pay | Admitting: Internal Medicine

## 2014-06-27 ENCOUNTER — Telehealth: Payer: Self-pay

## 2014-06-27 NOTE — Telephone Encounter (Signed)
Returned patient phone call Patient not available Left message on voice mail to return our call 

## 2014-07-06 ENCOUNTER — Encounter: Payer: Self-pay | Admitting: Family Medicine

## 2014-07-06 ENCOUNTER — Ambulatory Visit: Payer: 59 | Attending: Family Medicine | Admitting: Family Medicine

## 2014-07-06 VITALS — BP 132/88 | HR 78 | Temp 98.6°F | Resp 18 | Ht 64.0 in | Wt 186.0 lb

## 2014-07-06 DIAGNOSIS — Z23 Encounter for immunization: Secondary | ICD-10-CM

## 2014-07-06 DIAGNOSIS — Z720 Tobacco use: Secondary | ICD-10-CM | POA: Diagnosis not present

## 2014-07-06 DIAGNOSIS — Z760 Encounter for issue of repeat prescription: Secondary | ICD-10-CM | POA: Insufficient documentation

## 2014-07-06 DIAGNOSIS — F411 Generalized anxiety disorder: Secondary | ICD-10-CM

## 2014-07-06 DIAGNOSIS — I10 Essential (primary) hypertension: Secondary | ICD-10-CM | POA: Diagnosis not present

## 2014-07-06 DIAGNOSIS — F419 Anxiety disorder, unspecified: Secondary | ICD-10-CM | POA: Diagnosis not present

## 2014-07-06 DIAGNOSIS — Z6831 Body mass index (BMI) 31.0-31.9, adult: Secondary | ICD-10-CM | POA: Diagnosis not present

## 2014-07-06 MED ORDER — AMLODIPINE BESYLATE 5 MG PO TABS: 5.0000 mg | ORAL_TABLET | Freq: Every day | ORAL | Status: DC

## 2014-07-06 MED ORDER — ALPRAZOLAM 0.5 MG PO TABS: 0.5000 mg | ORAL_TABLET | Freq: Every day | ORAL | Status: DC | PRN

## 2014-07-06 MED ORDER — ATORVASTATIN CALCIUM 10 MG PO TABS: 10.0000 mg | ORAL_TABLET | Freq: Every day | ORAL | Status: DC

## 2014-07-06 MED ORDER — METOPROLOL TARTRATE 50 MG PO TABS: 50.0000 mg | ORAL_TABLET | Freq: Two times a day (BID) | ORAL | Status: DC

## 2014-07-06 NOTE — Assessment & Plan Note (Signed)
A: Anxiety chronic problem, untreated. With intermittent symptoms P:  Refilled xanax Look up cymbalta and effexor- options for treatment for anxiety.

## 2014-07-06 NOTE — Assessment & Plan Note (Signed)
A:  HTN: BP well controlled. P:  Stop lisinopril Take metoprolol 50 mg twice daily  Continue norvasc 5 mg dail

## 2014-07-06 NOTE — Progress Notes (Signed)
   Subjective:    Patient ID: Meredith Church, female    DOB: [birth year], 58 y.o.   MRN: [id number] CC: HTN f/u medication refill  HPI 58 yo F presents for HTN f/u:  1. CHRONIC HYPERTENSION  Disease Monitoring  Blood pressure range: does not check   Chest pain: no   Dyspnea: no   Claudication: no   Medication compliance: yes except taking metoprolol once daily  Medication Side Effects  Lightheadedness: no   Urinary frequency: no   Edema: no   Impotence: no   Preventitive Healthcare:  Exercise: yes   Diet Pattern: 3 meals per day   Salt Restriction: yes   2. Anxiety: x 8 years. Takes xanax prn. Symptoms occur a few times per week but not daily. Associated symptoms include tearfulness, overwhelmed feeling. Patient denies depressed mood.   Soc hx: current smoker , not ready to quit  Review of Systems As per HPI     Objective:   Physical Exam BP 132/88  Pulse 78  Temp(Src) 98.6 F (37 C) (Oral)  Resp 18  Ht 5\' 4"  (1.626 m)  Wt 186 lb (84.369 kg)  BMI 31.91 kg/m2  SpO2 97% BP Readings from Last 3 Encounters:  07/06/14 132/88  01/11/14 162/102  11/18/13 177/104  General appearance: alert, cooperative and no distress Lungs: clear to auscultation bilaterally Heart: regular rate and rhythm, S1, S2 normal, no murmur, click, rub or gallop Extremities: extremities normal, atraumatic, no cyanosis or edema       Assessment & Plan:

## 2014-07-06 NOTE — Patient Instructions (Signed)
Meredith Church,  Thank you for coming in today. It was a pleasure meeting you. I look forward to being your primary doctor.   1. HTN: BP well controlled. Stop lisinopril Take metoprolol 50 mg twice daily  Continue norvasc 5 mg daily.  2. Anxiety: Refilled xanax Look up cymbalta and effexor- options for treatment for anxiety.   F/u in 2 weeks for RN BP check  F/u with me in 3-4 weeks to discuss anxiety.   Dr. Adrian Blackwater

## 2014-07-06 NOTE — Progress Notes (Signed)
F/U HTN medicine refill

## 2014-07-27 ENCOUNTER — Encounter: Payer: Self-pay | Admitting: Family Medicine

## 2014-07-27 ENCOUNTER — Ambulatory Visit: Payer: 59 | Attending: Family Medicine | Admitting: Family Medicine

## 2014-07-27 VITALS — BP 150/80 | HR 76 | Temp 98.8°F | Resp 18 | Ht 64.0 in | Wt 185.0 lb

## 2014-07-27 DIAGNOSIS — H9203 Otalgia, bilateral: Secondary | ICD-10-CM

## 2014-07-27 DIAGNOSIS — F419 Anxiety disorder, unspecified: Secondary | ICD-10-CM | POA: Insufficient documentation

## 2014-07-27 DIAGNOSIS — Z720 Tobacco use: Secondary | ICD-10-CM

## 2014-07-27 DIAGNOSIS — F17200 Nicotine dependence, unspecified, uncomplicated: Secondary | ICD-10-CM

## 2014-07-27 DIAGNOSIS — H9209 Otalgia, unspecified ear: Secondary | ICD-10-CM | POA: Insufficient documentation

## 2014-07-27 DIAGNOSIS — I10 Essential (primary) hypertension: Secondary | ICD-10-CM

## 2014-07-27 DIAGNOSIS — F17210 Nicotine dependence, cigarettes, uncomplicated: Secondary | ICD-10-CM | POA: Diagnosis not present

## 2014-07-27 DIAGNOSIS — F411 Generalized anxiety disorder: Secondary | ICD-10-CM

## 2014-07-27 MED ORDER — ALPRAZOLAM 0.5 MG PO TABS
0.5000 mg | ORAL_TABLET | Freq: Every day | ORAL | Status: DC | PRN
Start: 2014-07-27 — End: 2014-09-28

## 2014-07-27 MED ORDER — AMLODIPINE BESYLATE 10 MG PO TABS: 10.0000 mg | ORAL_TABLET | Freq: Every day | ORAL | Status: DC

## 2014-07-27 NOTE — Progress Notes (Signed)
F/U anxiety Pt stated medication is helping

## 2014-07-27 NOTE — Assessment & Plan Note (Signed)
A: improved P: Refilled xanax for prn use

## 2014-07-27 NOTE — Patient Instructions (Signed)
Meredith Church,  Thank you for coming back to see me today.  1. Anxiety: improved control. Take xanax as needed.  2. Hypertension  3. Ear pain: normal exam of ear and jaw today.   F/u BP check with nurse in 4 weeks  F/u with me in 3 months   Dr. Adrian Blackwater

## 2014-07-27 NOTE — Progress Notes (Addendum)
   Subjective:    Patient ID: Meredith Church, female    DOB: [birth year], 58 y.o.   MRN: [id number] CC: f/u anxiety  HPI 58 yo F presents to f/u anxiety:  1. Anxiety: improved with prn xanax. Only one attack. No CP or SOB.  2. HTN: compliant with norvasc and lopressor. Has a stressful job. Not checking BP.   3. Ear pain: intermittent R and L ear pain x 2 weeks. No fever, jaw pain, ear discharge. Does wear a headset while working.   Soc Hx: current smoker 1/4 PPD  Review of Systems As per HPI  GAD 7 score of 9. 0-5. 1-1,4,6,7. 2-3. 3-2.      Objective:   Physical Exam BP 150/80 mmHg  Pulse 76  Temp(Src) 98.8 F (37.1 C) (Oral)  Resp 18  Ht 5\' 4"  (1.626 m)  Wt 185 lb (83.915 kg)  BMI 31.74 kg/m2  SpO2 97%  BP Readings from Last 3 Encounters:  07/27/14 155/97  07/06/14 132/88  01/11/14 162/102  General appearance: alert, cooperative and no distress Ears: normal TM's and external ear canals both ears Nose: no discharge, turbinates pink, swollen Throat: lips, mucosa, and tongue normal; teeth and gums normal Heart: regular rate and rhythm, S1, S2 normal, no murmur, click, rub or gallop       Assessment & Plan:

## 2014-07-27 NOTE — Assessment & Plan Note (Addendum)
A: BP above goal P: Increase norvasc to 10 daily Continue BID metoprolol  RN BP check in 4 weeks F/u with me in 3 months

## 2014-07-27 NOTE — Assessment & Plan Note (Signed)
A: current smoker P: Not yet ready to quit. Smoking cessation addressed

## 2014-08-08 ENCOUNTER — Telehealth: Payer: Self-pay | Admitting: *Deleted

## 2014-08-08 NOTE — Telephone Encounter (Signed)
Pt stating is Coughing, nose congested. Since Monday.  Advised to take Mucinex, cough drops.

## 2014-09-26 ENCOUNTER — Emergency Department (HOSPITAL_COMMUNITY): Payer: 59

## 2014-09-26 ENCOUNTER — Emergency Department (HOSPITAL_COMMUNITY)
Admission: EM | Admit: 2014-09-26 | Discharge: 2014-09-26 | Disposition: A | Discharge status: Home / Self Care | Payer: 59 | Attending: Emergency Medicine | Admitting: Emergency Medicine

## 2014-09-26 ENCOUNTER — Encounter (HOSPITAL_COMMUNITY): Payer: Self-pay | Admitting: Emergency Medicine

## 2014-09-26 DIAGNOSIS — R062 Wheezing: Secondary | ICD-10-CM | POA: Insufficient documentation

## 2014-09-26 DIAGNOSIS — Z720 Tobacco use: Secondary | ICD-10-CM | POA: Insufficient documentation

## 2014-09-26 DIAGNOSIS — R079 Chest pain, unspecified: Secondary | ICD-10-CM

## 2014-09-26 DIAGNOSIS — E785 Hyperlipidemia, unspecified: Secondary | ICD-10-CM | POA: Insufficient documentation

## 2014-09-26 DIAGNOSIS — I10 Essential (primary) hypertension: Secondary | ICD-10-CM | POA: Insufficient documentation

## 2014-09-26 DIAGNOSIS — F419 Anxiety disorder, unspecified: Secondary | ICD-10-CM | POA: Insufficient documentation

## 2014-09-26 DIAGNOSIS — R05 Cough: Secondary | ICD-10-CM | POA: Insufficient documentation

## 2014-09-26 DIAGNOSIS — Z79899 Other long term (current) drug therapy: Secondary | ICD-10-CM | POA: Insufficient documentation

## 2014-09-26 DIAGNOSIS — R0602 Shortness of breath: Secondary | ICD-10-CM

## 2014-09-26 DIAGNOSIS — R059 Cough, unspecified: Secondary | ICD-10-CM

## 2014-09-26 LAB — CBC
HCT: 47.5 % — ABNORMAL HIGH (ref 36.0–46.0)
Hemoglobin: 16 g/dL — ABNORMAL HIGH (ref 12.0–15.0)
MCH: 33.9 pg (ref 26.0–34.0)
MCHC: 33.7 g/dL (ref 30.0–36.0)
MCV: 100.6 fL — ABNORMAL HIGH (ref 78.0–100.0)
Platelets: 193 10*3/uL (ref 150–400)
RBC: 4.72 MIL/uL (ref 3.87–5.11)
RDW: 13.2 % (ref 11.5–15.5)
WBC: 4.6 10*3/uL (ref 4.0–10.5)

## 2014-09-26 LAB — BASIC METABOLIC PANEL
Anion gap: 9 (ref 5–15)
BUN: 10 mg/dL (ref 6–23)
CO2: 24 mmol/L (ref 19–32)
Calcium: 9.2 mg/dL (ref 8.4–10.5)
Chloride: 102 mEq/L (ref 96–112)
Creatinine, Ser: 0.64 mg/dL (ref 0.50–1.10)
GFR calc Af Amer: >90 mL/min (ref >90)
GFR calc non Af Amer: >90 mL/min (ref >90)
Glucose, Bld: 117 mg/dL — ABNORMAL HIGH (ref 70–99)
Potassium: 3.3 mmol/L — ABNORMAL LOW (ref 3.5–5.1)
Sodium: 135 mmol/L (ref 135–145)

## 2014-09-26 LAB — I-STAT TROPONIN, ED: Troponin i, poc: 0 ng/mL (ref 0.00–0.08)

## 2014-09-26 LAB — BRAIN NATRIURETIC PEPTIDE: B Natriuretic Peptide: 37.4 pg/mL (ref 0.0–100.0)

## 2014-09-26 LAB — TROPONIN I: Troponin I: <0.03 ng/mL (ref <0.031)

## 2014-09-26 MED ORDER — ALBUTEROL SULFATE (2.5 MG/3ML) 0.083% IN NEBU
5.0000 mg | INHALATION_SOLUTION | Freq: Once | RESPIRATORY_TRACT | Status: AC
  Administered 2014-09-26: 5 mg via RESPIRATORY_TRACT
  Filled 2014-09-26: qty 6

## 2014-09-26 MED ORDER — BENZONATATE 100 MG PO CAPS: 100.0000 mg | ORAL_CAPSULE | Freq: Three times a day (TID) | ORAL | Status: DC

## 2014-09-26 MED ORDER — PREDNISONE 20 MG PO TABS
60.0000 mg | ORAL_TABLET | Freq: Once | ORAL | Status: AC
  Administered 2014-09-26: 60 mg via ORAL
  Filled 2014-09-26: qty 3

## 2014-09-26 MED ORDER — PREDNISONE 10 MG PO TABS: 50.0000 mg | ORAL_TABLET | Freq: Every day | ORAL | Status: DC

## 2014-09-26 MED ORDER — BENZONATATE 100 MG PO CAPS
100.0000 mg | ORAL_CAPSULE | Freq: Once | ORAL | Status: AC
  Administered 2014-09-26: 100 mg via ORAL
  Filled 2014-09-26: qty 1

## 2014-09-26 MED ORDER — ALBUTEROL SULFATE HFA 108 (90 BASE) MCG/ACT IN AERS
2.0000 | INHALATION_SPRAY | Freq: Once | RESPIRATORY_TRACT | Status: AC
  Administered 2014-09-26: 2 via RESPIRATORY_TRACT
  Filled 2014-09-26: qty 6.7

## 2014-09-26 NOTE — ED Notes (Signed)
Pt c/ SOB that started yesterday c/o mid chest pain as well. Pt states she has been coughing to the point where she has vomitted.

## 2014-09-26 NOTE — ED Provider Notes (Addendum)
CSN: [id number]     Arrival date & time 09/26/14  1743 History   First MD Initiated Contact with Patient 09/26/14 1758     Chief Complaint  Patient presents with  . Shortness of Breath     (Consider location/radiation/quality/duration/timing/severity/associated sxs/prior Treatment) Patient is a 59 y.o. female presenting with shortness of breath.  Shortness of Breath Severity:  Moderate Onset quality:  Gradual Duration:  2 days Timing:  Constant Progression:  Worsening Chronicity:  New Context: URI   Relieved by:  Nothing Worsened by:  Coughing Associated symptoms: chest pain and cough (nonproductive)   Associated symptoms: no abdominal pain ("soreness" from coughing) and no fever     Past Medical History  Diagnosis Date  . Anxiety Dx 2007  . Hyperlipidemia Dx 2010  . Hypertension Dx 2010   Past Surgical History  Procedure Laterality Date  . Tibia fracture surgery      right  . Abdominal hysterectomy     Family History  Problem Relation Age of Onset  . Diabetes Mother   . Diabetes Sister    History  Substance Use Topics  . Smoking status: Current Some Day Smoker -- 0.25 packs/day    Types: Cigarettes  . Smokeless tobacco: Never Used     Comment: Smokes socially.  . Alcohol Use: No     Comment: Socially    OB History    No data available     Review of Systems  Constitutional: Negative for fever.  Respiratory: Positive for cough (nonproductive) and shortness of breath.   Cardiovascular: Positive for chest pain.  Gastrointestinal: Negative for abdominal pain ("soreness" from coughing).  All other systems reviewed and are negative.     Allergies  Review of patient's allergies indicates no known allergies.  Home Medications   Prior to Admission medications   Medication Sig Start Date End Date Taking? Authorizing Provider  ALPRAZolam Duanne Moron) 0.5 MG tablet Take 1 tablet (0.5 mg total) by mouth daily as needed for anxiety. 07/27/14  Yes Josalyn C  Funches, MD  amLODipine (NORVASC) 10 MG tablet Take 1 tablet (10 mg total) by mouth daily. Patient taking differently: Take 5 mg by mouth daily.  07/27/14  Yes Josalyn C Funches, MD  atorvastatin (LIPITOR) 10 MG tablet Take 1 tablet (10 mg total) by mouth daily at 6 PM. 07/06/14  Yes Josalyn C Funches, MD  Biotin 1000 MCG tablet Take 2,000 mcg by mouth daily.   Yes Historical Provider, MD  L-FORMULA LYSINE HCL PO Take 1 tablet by mouth daily.   Yes Historical Provider, MD  metoprolol (LOPRESSOR) 50 MG tablet Take 1 tablet (50 mg total) by mouth 2 (two) times daily. 07/06/14  Yes Josalyn C Funches, MD  vitamin C (ASCORBIC ACID) 500 MG tablet Take 500 mg by mouth daily.   Yes Historical Provider, MD   BP 163/102 mmHg  Pulse 71  Temp(Src) 99.8 F (37.7 C) (Oral)  Resp 22  SpO2 93% Physical Exam  Constitutional: She is oriented to person, place, and time. She appears well-developed and well-nourished. No distress.  HENT:  Head: Normocephalic and atraumatic.  Mouth/Throat: Oropharynx is clear and moist.  Eyes: Conjunctivae are normal. Pupils are equal, round, and reactive to light. No scleral icterus.  Neck: Neck supple.  Cardiovascular: Normal rate, regular rhythm, normal heart sounds and intact distal pulses.   No murmur heard. Pulmonary/Chest: Effort normal. No stridor. Tachypnea noted. No respiratory distress. She has wheezes (diffuse). She has no rales.  Abdominal: Soft. Bowel  sounds are normal. She exhibits no distension. There is no tenderness.  Musculoskeletal: Normal range of motion.  Neurological: She is alert and oriented to person, place, and time.  Skin: Skin is warm and dry. No rash noted.  Psychiatric: She has a normal mood and affect. Her behavior is normal.  Nursing note and vitals reviewed.   ED Course  Procedures (including critical care time) Labs Review Labs Reviewed  CBC - Abnormal; Notable for the following:    Hemoglobin 16.0 (*)    HCT 47.5 (*)    MCV 100.6  (*)    All other components within normal limits  BASIC METABOLIC PANEL - Abnormal; Notable for the following:    Potassium 3.3 (*)    Glucose, Bld 117 (*)    All other components within normal limits  BRAIN NATRIURETIC PEPTIDE  TROPONIN I  I-STAT TROPOININ, ED    Imaging Review Dg Chest 2 View  09/26/2014   CLINICAL DATA:  Pt C/o productive cough, intermittent fever, sob x 3 days, pt reports hx smoking and htn today  EXAM: CHEST  2 VIEW  COMPARISON:  10/13/2010  FINDINGS: Cardiac silhouette is normal in size and configuration. Normal mediastinal and hilar contours.  Clear lungs.  No pleural effusion or pneumothorax.  Bony thorax is demineralized but intact.  IMPRESSION: No active cardiopulmonary disease.   Electronically Signed   By: Lajean Manes M.D.   On: 09/26/2014 19:09  All radiology studies independently viewed by me.      EKG Interpretation None      EKG - sinus tachycardia with occasion PAC, rate104, normal axis, normal intervals, no ST/T changes. No priors for comparison.    MDM   Final diagnoses:  Cough  Shortness of breath    59 year old female, smoker, who presents with cough, shortness of breath since yesterday. On exam, mild increased work of breathing with diffuse wheezing. She has no history of COPD or asthma. However, she improved with albuterol treatments. After treatment, she felt better, work of breathing improved, and she was able to ambulate without shortness of breath or hypoxia. Plan to treat similarly has COPD exacerbation with albuterol and steroids. She will follow-up closely with her primary doctor. Have given her strict return precautions.  She was advised to quit smoking.   [name], MD 09/26/14 [place], MD 09/26/14 [phone]

## 2014-09-28 ENCOUNTER — Ambulatory Visit: Payer: 59 | Attending: Family Medicine | Admitting: Family Medicine

## 2014-09-28 ENCOUNTER — Encounter: Payer: Self-pay | Admitting: Family Medicine

## 2014-09-28 VITALS — BP 130/84 | HR 78 | Temp 98.6°F | Resp 16 | Ht 64.0 in | Wt 185.0 lb

## 2014-09-28 DIAGNOSIS — J209 Acute bronchitis, unspecified: Secondary | ICD-10-CM | POA: Insufficient documentation

## 2014-09-28 DIAGNOSIS — Z87891 Personal history of nicotine dependence: Secondary | ICD-10-CM | POA: Insufficient documentation

## 2014-09-28 DIAGNOSIS — F419 Anxiety disorder, unspecified: Secondary | ICD-10-CM | POA: Insufficient documentation

## 2014-09-28 DIAGNOSIS — F411 Generalized anxiety disorder: Secondary | ICD-10-CM

## 2014-09-28 MED ORDER — GUAIFENESIN-CODEINE 100-10 MG/5ML PO SYRP: 5.0000 mL | ORAL_SOLUTION | Freq: Four times a day (QID) | ORAL | Status: DC | PRN

## 2014-09-28 MED ORDER — ESCITALOPRAM OXALATE 10 MG PO TABS: 10.0000 mg | ORAL_TABLET | Freq: Every day | ORAL | Status: DC

## 2014-09-28 MED ORDER — ALPRAZOLAM 0.5 MG PO TABS: 0.5000 mg | ORAL_TABLET | Freq: Every day | ORAL | Status: DC | PRN

## 2014-09-28 NOTE — Patient Instructions (Signed)
Meredith Church,  Thank you for coming in to see me today.  1. Anxiety: Start lexapro once daily Refilled xanax   2. Current cold-bronchitis Finish prednisone 50 mg daily for next 4 days  Use albuterol as needed Tessalon for cough Cough medicine with codeine for cough and pain.  F/u in 4 weeks for anxiety, sooner if needed  Dr. Adrian Blackwater

## 2014-09-28 NOTE — Progress Notes (Signed)
Productive cough with white sputum since Mon Ed on Tuesday given 2 breathing tx, prednisone, tessalon Patient quit smoking cigs 2 weeks ago Cough is improving Pt refused to complete depression screen tool Needs anxiety meds

## 2014-09-28 NOTE — Assessment & Plan Note (Signed)
A: Current cold-bronchitis P: Finish prednisone 50 mg daily for next 4 days  Use albuterol as needed Tessalon for cough Cough medicine with codeine for cough and pain.

## 2014-09-28 NOTE — Progress Notes (Signed)
   Subjective:    Patient ID: Meredith Church, female    DOB: 1956/05/31, 59 y.o.   MRN: [id number] CC: ED f/u for SOB HPI 59 yo F presents for ED f/u of SOB:  1. SOB: x 5 days. With productive cough. Subjective fever at home. No chest pain today. Symptoms are improving. She took prednisone 50 mg today and yesterday. She is still having cough that interferes with sleep.    2. Anxiety: persistent and daily. Denies SI. Amenable to daily treatment. Would like a refill on xanax as week   Soc Hx: smoking, none in two weeks  Review of Systems As per HPI  GAD 7: score of 19. 2-5 and 7. 3-all others    Objective:   Physical Exam BP 130/84 mmHg  Pulse 78  Temp(Src) 98.6 F (37 C)  Resp 16  Ht 5\' 4"  (1.626 m)  Wt 185 lb (83.915 kg)  BMI 31.74 kg/m2  SpO2 92% General appearance: alert, cooperative and no distress  Nose: swollen NT, R sided  Throat: clear  Lungs: slight increased WOB. Diminished air movement b/l with coarse BS. No wheezing or crackles.  Heart: regular rate and rhythm, S1, S2 normal, no murmur, click, rub or gallop       Assessment & Plan:

## 2014-09-28 NOTE — Assessment & Plan Note (Addendum)
1. Anxiety: Start lexapro once daily Refilled xanax  F/u in 4 weeks

## 2014-10-10 ENCOUNTER — Other Ambulatory Visit: Payer: Self-pay | Admitting: Family Medicine

## 2014-10-10 MED ORDER — BENZONATATE 100 MG PO CAPS: 100.0000 mg | ORAL_CAPSULE | Freq: Three times a day (TID) | ORAL | Status: DC | PRN

## 2014-10-26 ENCOUNTER — Ambulatory Visit: Payer: 59

## 2014-11-16 ENCOUNTER — Ambulatory Visit: Payer: Self-pay | Attending: Family Medicine | Admitting: Family Medicine

## 2014-11-16 VITALS — BP 155/104 | HR 84 | Temp 98.0°F | Resp 16 | Ht 64.0 in | Wt 185.0 lb

## 2014-11-16 DIAGNOSIS — R2242 Localized swelling, mass and lump, left lower limb: Secondary | ICD-10-CM | POA: Insufficient documentation

## 2014-11-16 DIAGNOSIS — T148XXA Other injury of unspecified body region, initial encounter: Secondary | ICD-10-CM | POA: Insufficient documentation

## 2014-11-16 DIAGNOSIS — T148 Other injury of unspecified body region: Secondary | ICD-10-CM

## 2014-11-16 MED ORDER — IBUPROFEN 600 MG PO TABS: 600.0000 mg | ORAL_TABLET | Freq: Three times a day (TID) | ORAL | Status: DC | PRN

## 2014-11-16 NOTE — Assessment & Plan Note (Signed)
There is a 3 x 4" firm mass in her left anterior thigh. 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 There is no redness. There is minor tenderness. There is no warmth.  Plan have explained this is not consistent anyway with a blood clot that it feels like it could be a lipoma that she may made sore by playing with it pressing on it or it could be a small muscle mass from a strain of bacteria. I have prescribed ibuprofen 600 mg that she continues one 3 times a day when necessary and have advised her to rest the area for day or 2 and to apply heat several times a day she should of course follow up if anything about this worsens

## 2014-11-16 NOTE — Progress Notes (Signed)
Patient report seeing a "knot" on her upper left thigh it is painful and warm to the touch Patient reports no injury to the sight

## 2014-11-16 NOTE — Patient Instructions (Signed)
Rest leg for a few days and apply heat. Follow-up if worsening or not improving over the next couple of weeks.

## 2014-12-14 ENCOUNTER — Ambulatory Visit: Payer: Self-pay | Admitting: Family Medicine

## 2014-12-18 ENCOUNTER — Ambulatory Visit: Payer: Self-pay | Attending: Family Medicine | Admitting: Family Medicine

## 2014-12-18 ENCOUNTER — Encounter: Payer: Self-pay | Admitting: Family Medicine

## 2014-12-18 VITALS — BP 134/82 | HR 82 | Temp 98.5°F | Resp 16 | Ht 64.0 in | Wt 183.0 lb

## 2014-12-18 DIAGNOSIS — J069 Acute upper respiratory infection, unspecified: Secondary | ICD-10-CM

## 2014-12-18 MED ORDER — ALBUTEROL SULFATE HFA 108 (90 BASE) MCG/ACT IN AERS: 2.0000 | INHALATION_SPRAY | Freq: Four times a day (QID) | RESPIRATORY_TRACT | Status: DC | PRN

## 2014-12-18 NOTE — Progress Notes (Signed)
Patient ID: DAMIAH MCDONALD, female   DOB: 07-12-1956, 59 y.o.   MRN: [id number]   CC: Cold symptoms   SUBJECTIVE:  Patient presents with a 2 day history of sore throat, nasal congestion and cough. She reports have a fever of 101 on Saturday but none today. She denies chills, does admit to mild body aches.She does have a history of developing wheeze with a URI and has used an inhaler in the past.      OBJECTIVE:  Is alert, oriented, and appropriate, in no acute distress.  TMS are clear, conjunctiva clear, nasal passages mildly inflammed with muscus present. Throat shows generalized redness but no tonsillar swelling or exudate. Her neck is supple FROM w/o adenopathy or tenderness.  Lungs are clear to ausculatation. There is no increased respiratory effort. HS are regular w/o m,g,r.    ASSESSMENT:  Viral URI and ST    PLAN:  Lots of liquids, OTC symptomatic measures as desired. Albuterol inhaler if develops wheezing. Follow-up if symptoms worsen after a week or are not improving in 10 days. Follow-up if develops persistent wheezing not relieved with inhaler    Micheline Chapman, FNP-BC

## 2014-12-18 NOTE — Patient Instructions (Signed)
Lots of liquids Warm salt water gargles for ST. OTC symptomatic measures at desired. Follow-up here if in one week symptoms are getting worse or in 10 days if not getting better.Upper Respiratory Infection, Adult An upper respiratory infection (URI) is also sometimes known as the common cold. The upper respiratory tract includes the nose, sinuses, throat, trachea, and bronchi. Bronchi are the airways leading to the lungs. Most people improve within 1 week, but symptoms can last up to 2 weeks. A residual cough may last even longer.  CAUSES Many different viruses can infect the tissues lining the upper respiratory tract. The tissues become irritated and inflamed and often become very moist. Mucus production is also common. A cold is contagious. You can easily spread the virus to others by oral contact. This includes kissing, sharing a glass, coughing, or sneezing. Touching your mouth or nose and then touching a surface, which is then touched by another person, can also spread the virus. SYMPTOMS  Symptoms typically develop 1 to 3 days after you come in contact with a cold virus. Symptoms vary from person to person. They may include:  Runny nose.  Sneezing.  Nasal congestion.  Sinus irritation.  Sore throat.  Cough.  Loss of voice (laryngitis).  Cough.  Fatigue.  Muscle aches.  Loss of appetite.  Headache.  Low-grade fever. DIAGNOSIS  You might diagnose your own cold based on familiar symptoms, since most people get a cold 2 to 3 times a year. Your caregiver can confirm this based on your exam. Most importantly, your caregiver can check that your symptoms are not due to another disease such as strep throat, sinusitis, pneumonia, asthma, or epiglottitis. Blood tests, throat tests, and X-rays are not necessary to diagnose a common cold, but they may sometimes be helpful in excluding other more serious diseases. Your caregiver will decide if any further tests are required. RISKS AND  COMPLICATIONS  You may be at risk for a more severe case of the common cold if you smoke cigarettes, have chronic heart disease (such as heart failure) or lung disease (such as asthma), or if you have a weakened immune system. The very young and very old are also at risk for more serious infections. Bacterial sinusitis, middle ear infections, and bacterial pneumonia can complicate the common cold. The common cold can worsen asthma and chronic obstructive pulmonary disease (COPD). Sometimes, these complications can require emergency medical care and may be life-threatening. PREVENTION  The best way to protect against getting a cold is to practice good hygiene. Avoid oral or hand contact with people with cold symptoms. Wash your hands often if contact occurs. There is no clear evidence that vitamin C, vitamin E, echinacea, or exercise reduces the chance of developing a cold. However, it is always recommended to get plenty of rest and practice good nutrition. TREATMENT  Treatment is directed at relieving symptoms. There is no cure. Antibiotics are not effective, because the infection is caused by a virus, not by bacteria. Treatment may include:  Increased fluid intake. Sports drinks offer valuable electrolytes, sugars, and fluids.  Breathing heated mist or steam (vaporizer or shower).  Eating chicken soup or other clear broths, and maintaining good nutrition.  Getting plenty of rest.  Using gargles or lozenges for comfort.  Controlling fevers with ibuprofen or acetaminophen as directed by your caregiver.  Increasing usage of your inhaler if you have asthma. Zinc gel and zinc lozenges, taken in the first 24 hours of the common cold, can shorten the  duration and lessen the severity of symptoms. Pain medicines may help with fever, muscle aches, and throat pain. A variety of non-prescription medicines are available to treat congestion and runny nose. Your caregiver can make recommendations and may  suggest nasal or lung inhalers for other symptoms.  HOME CARE INSTRUCTIONS   Only take over-the-counter or prescription medicines for pain, discomfort, or fever as directed by your caregiver.  Use a warm mist humidifier or inhale steam from a shower to increase air moisture. This may keep secretions moist and make it easier to breathe.  Drink enough water and fluids to keep your urine clear or pale yellow.  Rest as needed.  Return to work when your temperature has returned to normal or as your caregiver advises. You may need to stay home longer to avoid infecting others. You can also use a face mask and careful hand washing to prevent spread of the virus. SEEK MEDICAL CARE IF:   After the first few days, you feel you are getting worse rather than better.  You need your caregiver's advice about medicines to control symptoms.  You develop chills, worsening shortness of breath, or brown or red sputum. These may be signs of pneumonia.  You develop yellow or brown nasal discharge or pain in the face, especially when you bend forward. These may be signs of sinusitis.  You develop a fever, swollen neck glands, pain with swallowing, or white areas in the back of your throat. These may be signs of strep throat. SEEK IMMEDIATE MEDICAL CARE IF:   You have a fever.  You develop severe or persistent headache, ear pain, sinus pain, or chest pain.  You develop wheezing, a prolonged cough, cough up blood, or have a change in your usual mucus (if you have chronic lung disease).  You develop sore muscles or a stiff neck. Document Released: 02/25/2001 Document Revised: 11/24/2011 Document Reviewed: 12/07/2013 [facility] Patient Information 2015 [place], [place]. This information is not intended to replace advice given to you by your health care provider. Make sure you discuss any questions you have with your health care provider.

## 2014-12-18 NOTE — Progress Notes (Signed)
Patient reports cough, sore throat, fever (101.F) starting on Saturday Patient reports she has been using Theraflu and it did not help Patient did have flu shot

## 2015-01-12 ENCOUNTER — Other Ambulatory Visit: Payer: Self-pay | Admitting: *Deleted

## 2015-01-12 ENCOUNTER — Other Ambulatory Visit: Payer: Self-pay | Admitting: Internal Medicine

## 2015-01-12 ENCOUNTER — Other Ambulatory Visit: Payer: Self-pay | Admitting: Family Medicine

## 2015-01-12 DIAGNOSIS — I10 Essential (primary) hypertension: Secondary | ICD-10-CM

## 2015-01-12 MED ORDER — METOPROLOL TARTRATE 50 MG PO TABS: 50.0000 mg | ORAL_TABLET | Freq: Two times a day (BID) | ORAL | Status: DC

## 2015-01-15 ENCOUNTER — Telehealth: Payer: Self-pay | Admitting: *Deleted

## 2015-01-15 MED ORDER — AMLODIPINE BESYLATE 10 MG PO TABS: 10.0000 mg | ORAL_TABLET | Freq: Every day | ORAL | Status: DC

## 2015-01-15 NOTE — Telephone Encounter (Signed)
Per chart review patient was on norvasc and metoprolol. Refilled norvasc, will see patient in f/u in 2 days

## 2015-01-15 NOTE — Telephone Encounter (Signed)
Left voice message Rx at [place]

## 2015-01-15 NOTE — Addendum Note (Signed)
Addended by: Boykin Nearing on: 01/15/2015 09:05 AM   Modules accepted: Orders

## 2015-01-15 NOTE — Telephone Encounter (Signed)
Unable to contact Pt... No phone line

## 2015-01-17 ENCOUNTER — Ambulatory Visit: Payer: Self-pay | Attending: Family Medicine | Admitting: Family Medicine

## 2015-01-17 ENCOUNTER — Encounter: Payer: Self-pay | Admitting: Family Medicine

## 2015-01-17 VITALS — BP 142/88 | HR 76 | Temp 98.8°F | Resp 16 | Ht 64.0 in | Wt 184.0 lb

## 2015-01-17 DIAGNOSIS — I10 Essential (primary) hypertension: Secondary | ICD-10-CM | POA: Insufficient documentation

## 2015-01-17 DIAGNOSIS — T148XXA Other injury of unspecified body region, initial encounter: Secondary | ICD-10-CM

## 2015-01-17 DIAGNOSIS — R21 Rash and other nonspecific skin eruption: Secondary | ICD-10-CM | POA: Insufficient documentation

## 2015-01-17 DIAGNOSIS — L301 Dyshidrosis [pompholyx]: Secondary | ICD-10-CM | POA: Insufficient documentation

## 2015-01-17 DIAGNOSIS — T148 Other injury of unspecified body region: Secondary | ICD-10-CM

## 2015-01-17 MED ORDER — IBUPROFEN 600 MG PO TABS: 600.0000 mg | ORAL_TABLET | Freq: Three times a day (TID) | ORAL | Status: DC | PRN

## 2015-01-17 MED ORDER — TRIAMCINOLONE ACETONIDE 0.1 % EX CREA: 1.0000 "application " | TOPICAL_CREAM | Freq: Two times a day (BID) | CUTANEOUS | Status: DC

## 2015-01-17 NOTE — Progress Notes (Signed)
   Subjective:    Patient ID: Meredith Church, female    DOB: 1956/03/14, 59 y.o.   MRN: [id number] CC: f/u HTN  HPI  1. CHRONIC HYPERTENSION  Disease Monitoring  Blood pressure range: not cheking   Chest pain: no   Dyspnea: no   Claudication: no   Medication compliance: yes  Medication Side Effects  Lightheadedness: no   Urinary frequency: no   Edema: no   I   Preventitive Healthcare:  Exercise: yes   Diet Pattern:   Salt Restriction: yes   2. Hand rash: L hand. No itching. No spreading. No close contacts with rash. No treatment.   Soc Hx: former smoker, quit but has a cigarette now and then  Review of Systems As per HPI     Objective:   Physical Exam BP 142/88 mmHg  Pulse 76  Temp(Src) 98.8 F (37.1 C) (Oral)  Resp 16  Ht 5\' 4"  (1.626 m)  Wt 184 lb (83.462 kg)  BMI 31.57 kg/m2  SpO2 97%  BP Readings from Last 3 Encounters:  01/17/15 142/88  12/18/14 134/82  11/16/14 155/104  General appearance: alert, cooperative and no distress Lungs: clear to auscultation bilaterally Heart: regular rate and rhythm, S1, S2 normal, no murmur, click, rub or gallop Extremities: extremities normal, atraumatic, no cyanosis or edema  Skin: scaly rash on dorsum hand slightly raised        Assessment & Plan:

## 2015-01-17 NOTE — Patient Instructions (Signed)
Meredith Church,  Thank you for coming in today.  1. HTN: BP goal < 150/90 Continue norvasc 10 and metoprolol 50 mg twice daily Do not smoke at all.    2. Hand rash for one month: kenalog cream ordered  Ibuprofen ordered for leg pain, use sparingly  F/u in 2 months for HTN and hand rash   Dr. Adrian Blackwater

## 2015-01-17 NOTE — Progress Notes (Signed)
F/U HTN  Complaining of sore on hand and finger

## 2015-01-19 ENCOUNTER — Encounter: Payer: Self-pay | Admitting: Family Medicine

## 2015-01-19 NOTE — Assessment & Plan Note (Signed)
HTN: BP goal < 150/90 Continue norvasc 10 and metoprolol 50 mg twice daily Do not smoke at all.

## 2015-01-19 NOTE — Assessment & Plan Note (Signed)
A: Hand rash for one month: suspect eczema P: kenalog cream ordered

## 2015-02-28 ENCOUNTER — Ambulatory Visit: Payer: Self-pay | Attending: Physician Assistant | Admitting: Physician Assistant

## 2015-02-28 ENCOUNTER — Encounter: Payer: Self-pay | Admitting: Physician Assistant

## 2015-02-28 VITALS — BP 124/83 | HR 83 | Temp 97.9°F | Resp 18 | Ht 64.0 in | Wt 183.6 lb

## 2015-02-28 DIAGNOSIS — T148XXA Other injury of unspecified body region, initial encounter: Secondary | ICD-10-CM

## 2015-02-28 DIAGNOSIS — F411 Generalized anxiety disorder: Secondary | ICD-10-CM

## 2015-02-28 DIAGNOSIS — M5441 Lumbago with sciatica, right side: Secondary | ICD-10-CM

## 2015-02-28 DIAGNOSIS — T148 Other injury of unspecified body region: Secondary | ICD-10-CM

## 2015-02-28 MED ORDER — IBUPROFEN 600 MG PO TABS: 600.0000 mg | ORAL_TABLET | Freq: Three times a day (TID) | ORAL | Status: DC

## 2015-02-28 MED ORDER — ALPRAZOLAM 0.5 MG PO TABS: 0.5000 mg | ORAL_TABLET | Freq: Every evening | ORAL | Status: DC | PRN

## 2015-02-28 MED ORDER — CYCLOBENZAPRINE HCL 10 MG PO TABS: 10.0000 mg | ORAL_TABLET | Freq: Two times a day (BID) | ORAL | Status: DC

## 2015-02-28 NOTE — Patient Instructions (Signed)
Motrin three times daily with meals Flexeril twice daily Moist heat twice daily Follow up in 2 weeks; if not better will consider referral

## 2015-02-28 NOTE — Progress Notes (Signed)
Patient here for sciatica pain that originates in right side of buttocks and runs down her leg.  Patient states it feels like "it won't let you go" when she tries to walk.  Pain described as 10/10 and has been bothering her for about 1 week.  Patient states she has tried hot/cold compresses and has no relief.  Patient also complains of red bump on inside of right eyelid since Wednesday.  She states it causes discomfort and she can feel it whenever she blinks.  Patient has no further complaints at this time.

## 2015-02-28 NOTE — Progress Notes (Signed)
Chief Complaint: Back and leg pain  Subjective: This is a 59 year old female who comes in with back and leg discomfort. She states that her right leg has been given away for the last couple weeks. She feels a pulling sensation from her buttock down to her feet. No numbness. No tingling. She's had these symptoms before and was seen by orthopedics. At that time to put on anti-inflammatories and a muscle relaxer. She's been trying ice compresses at home without relief. She is not been able to work for the last couple of days.   ROS:  GEN: denies fever or chills, denies change in weight EXT: denies muscle spasms or swelling; no pain in lower ext, no weakness NEURO: denies numbness or tingling, denies sz, stroke or TIA   Objective:  Filed Vitals:   02/28/15 1438  BP: 124/83  Pulse: 83  Temp: 97.9 F (36.6 C)  TempSrc: Oral  Resp: 18  Height: 5\' 4"  (1.626 m)  Weight: 183 lb 9.6 oz (83.28 kg)  SpO2: 96%    P/E:  General: in no acute distress. Extremities: No clubbing cyanosis or edema with positive pedal pulses. Good ROM and strength. Neuro: Alert, awake, oriented x3, nonfocal.    Medications: Prior to Admission medications   Medication Sig Start Date End Date Taking? Authorizing Provider  amLODipine (NORVASC) 10 MG tablet Take 1 tablet (10 mg total) by mouth daily. 01/15/15  Yes Josalyn Funches, MD  Biotin 1000 MCG tablet Take 2,000 mcg by mouth daily.   Yes Historical Provider, MD  metoprolol (LOPRESSOR) 50 MG tablet TAKE 1 TABLET BY MOUTH 2 TIMES DAILY. 01/15/15  Yes Josalyn Funches, MD  triamcinolone cream (KENALOG) 0.1 % Apply 1 application topically 2 (two) times daily. Apply to hand 01/17/15  Yes Boykin Nearing, MD  vitamin C (ASCORBIC ACID) 500 MG tablet Take 500 mg by mouth daily.   Yes Historical Provider, MD  albuterol (PROVENTIL HFA;VENTOLIN HFA) 108 (90 BASE) MCG/ACT inhaler Inhale 2 puffs into the lungs every 6 (six) hours as needed for wheezing or shortness of  breath. Patient not taking: Reported on 02/28/2015 12/18/14   Micheline Chapman, NP  ALPRAZolam Duanne Moron) 0.5 MG tablet Take 1 tablet (0.5 mg total) by mouth at bedtime as needed for anxiety. 02/28/15   Penelopi Mikrut Daneil Dan, PA-C  cyclobenzaprine (FLEXERIL) 10 MG tablet Take 1 tablet (10 mg total) by mouth 2 (two) times daily. 02/28/15   Sueellen Kayes Daneil Dan, PA-C  ibuprofen (ADVIL,MOTRIN) 600 MG tablet Take 1 tablet (600 mg total) by mouth 3 (three) times daily. With meals 02/28/15   Brayton Caves, PA-C    Assessment: 1. Acute on chronic Low back pain w/ Sciatica on right  Plan: Moist heat Anti-inflammatories temporarily Muscle Relaxer Discussed different stretches that she could do  Follow up: 2 weeks with Dr. Adrian Blackwater  The patient was given clear instructions to go to ER or return to medical center if symptoms don't improve, worsen or new problems develop. The patient verbalized understanding.   This note has been created with Surveyor, quantity. Any transcriptional errors are unintentional.   Meredith Pho, PA-C 02/28/2015, 3:49 PM

## 2015-03-14 ENCOUNTER — Telehealth: Payer: Self-pay | Admitting: Family Medicine

## 2015-03-14 DIAGNOSIS — T148XXA Other injury of unspecified body region, initial encounter: Secondary | ICD-10-CM

## 2015-03-14 NOTE — Telephone Encounter (Signed)
Patient called requesting to speak to nurse regarding her back pain with sciatica , patient would like to know if a different antiinflammatory medication can be prescribed , please f/u .

## 2015-03-14 NOTE — Telephone Encounter (Signed)
Pt requesting parking sticker form, stated has a hard time walking due to leg pain Stated Ibuprofen not working and requesting Rx antiinflammatory.

## 2015-03-15 ENCOUNTER — Telehealth: Payer: Self-pay | Admitting: Family Medicine

## 2015-03-15 MED ORDER — DICLOFENAC SODIUM 75 MG PO TBEC: 75.0000 mg | DELAYED_RELEASE_TABLET | Freq: Two times a day (BID) | ORAL | Status: DC

## 2015-03-15 NOTE — Telephone Encounter (Signed)
Sent in diclofenac, removed ibuprofen from med list

## 2015-03-15 NOTE — Addendum Note (Signed)
Addended by: Boykin Nearing on: 03/15/2015 09:04 AM   Modules accepted: Orders

## 2015-03-15 NOTE — Telephone Encounter (Signed)
LVM to return call.

## 2015-03-15 NOTE — Telephone Encounter (Signed)
Patient is returning phone call, please f/u with pt.

## 2015-03-20 NOTE — Telephone Encounter (Signed)
Pt was notified form ready at front office

## 2015-04-13 ENCOUNTER — Ambulatory Visit: Payer: Self-pay | Admitting: Family Medicine

## 2015-04-25 ENCOUNTER — Telehealth: Payer: Self-pay | Admitting: Family Medicine

## 2015-04-25 NOTE — Telephone Encounter (Signed)
Patient called requesting medication refill for muscle relaxer and anti-inflammatory. Please follow up.

## 2015-04-27 NOTE — Telephone Encounter (Signed)
Patient called requesting medication refill for muscle relaxer and anti-inflammatory. Please follow up.

## 2015-05-07 ENCOUNTER — Ambulatory Visit: Payer: Self-pay | Attending: Family Medicine | Admitting: Family Medicine

## 2015-05-07 ENCOUNTER — Encounter: Payer: Self-pay | Admitting: Family Medicine

## 2015-05-07 VITALS — BP 132/85 | HR 77 | Temp 98.4°F | Resp 16 | Ht 64.0 in | Wt 180.0 lb

## 2015-05-07 DIAGNOSIS — Z87891 Personal history of nicotine dependence: Secondary | ICD-10-CM | POA: Insufficient documentation

## 2015-05-07 DIAGNOSIS — I10 Essential (primary) hypertension: Secondary | ICD-10-CM

## 2015-05-07 DIAGNOSIS — M5410 Radiculopathy, site unspecified: Secondary | ICD-10-CM

## 2015-05-07 DIAGNOSIS — M5416 Radiculopathy, lumbar region: Secondary | ICD-10-CM

## 2015-05-07 DIAGNOSIS — M25551 Pain in right hip: Secondary | ICD-10-CM | POA: Insufficient documentation

## 2015-05-07 DIAGNOSIS — F411 Generalized anxiety disorder: Secondary | ICD-10-CM

## 2015-05-07 DIAGNOSIS — G8929 Other chronic pain: Secondary | ICD-10-CM

## 2015-05-07 MED ORDER — METHYLPREDNISOLONE 4 MG PO TBPK: ORAL_TABLET | ORAL | Status: DC

## 2015-05-07 MED ORDER — ALPRAZOLAM 0.5 MG PO TABS: 0.5000 mg | ORAL_TABLET | Freq: Every evening | ORAL | Status: DC | PRN

## 2015-05-07 MED ORDER — PREGABALIN 50 MG PO CAPS: 50.0000 mg | ORAL_CAPSULE | Freq: Three times a day (TID) | ORAL | Status: DC

## 2015-05-07 MED ORDER — METOPROLOL TARTRATE 50 MG PO TABS: 50.0000 mg | ORAL_TABLET | Freq: Two times a day (BID) | ORAL | Status: DC

## 2015-05-07 NOTE — Progress Notes (Signed)
   Subjective:    Patient ID: Meredith Church, female    DOB: 25-May-1956, 59 y.o.   MRN: [id number] CC: R hip pain  HPI  59 yo F smoker with HTN  Chronic R hip pain worse x 3 weeks. Worse with sitting. No new injury.  No falls. Sharp pains. R lateal hip to calf.    Social History  Substance Use Topics  . Smoking status: Former Smoker -- 0.25 packs/day    Types: Cigarettes    Quit date: 07/18/2014  . Smokeless tobacco: Never Used     Comment: Smokes socially.  . Alcohol Use: No     Comment: Socially    Review of Systems  Constitutional: Negative for fever and chills.  Eyes: Negative for visual disturbance.  Respiratory: Negative for shortness of breath.   Cardiovascular: Negative for chest pain.  Gastrointestinal: Negative for abdominal pain and blood in stool.  Musculoskeletal: Positive for back pain and gait problem. Negative for arthralgias.  Skin: Negative for rash.  Allergic/Immunologic: Negative for immunocompromised state.  Hematological: Negative for adenopathy. Does not bruise/bleed easily.  Psychiatric/Behavioral: Negative for suicidal ideas and dysphoric mood.       Objective:   Physical Exam  Constitutional: She is oriented to person, place, and time. She appears well-developed and well-nourished. No distress.  Pulmonary/Chest: Effort normal.  Musculoskeletal: She exhibits no edema.  Neurological: She is alert and oriented to person, place, and time.  Skin: Skin is warm and dry. No rash noted.  Psychiatric: She has a normal mood and affect.  Back Exam: Back: Normal Curvature, no deformities or CVA tenderness  Paraspinal Tenderness: R lumbar   LE Strength 5/5  LE Sensation: in tact  LE Reflexes 2+ and symmetric  Straight leg raise: + on R         Assessment & Plan:

## 2015-05-07 NOTE — Patient Instructions (Addendum)
Meredith Church,  Thank you for coming in today.   1. R leg pain from sciatica Medrol dose pak for acute pain lyrica for chronic pain drop off at pharmacy to apply for PASS   Refilled meds  F/u in 2 months  Dr. Adrian Blackwater

## 2015-05-07 NOTE — Assessment & Plan Note (Signed)
R leg pain from sciatica Medrol dose pak for acute pain lyrica for chronic pain drop off at pharmacy to apply for PASS

## 2015-05-07 NOTE — Progress Notes (Signed)
Complaining of Leg pain. Pain scale #10.  Pain worsen with walk. Medicine refills, work note

## 2015-05-16 ENCOUNTER — Other Ambulatory Visit: Payer: Self-pay | Admitting: *Deleted

## 2015-05-16 DIAGNOSIS — T148XXA Other injury of unspecified body region, initial encounter: Secondary | ICD-10-CM

## 2015-05-16 MED ORDER — CYCLOBENZAPRINE HCL 10 MG PO TABS: 10.0000 mg | ORAL_TABLET | Freq: Two times a day (BID) | ORAL | Status: DC

## 2015-05-16 MED ORDER — DICLOFENAC SODIUM 75 MG PO TBEC: 75.0000 mg | DELAYED_RELEASE_TABLET | Freq: Two times a day (BID) | ORAL | Status: DC

## 2015-05-16 NOTE — Telephone Encounter (Signed)
Rx refilled send to [facility] notified  Stated still with leg pain and was unavailable to walk, "leg was pulling" Per Dr Adrian Blackwater if Sx continue go to ED

## 2015-05-29 ENCOUNTER — Telehealth: Payer: Self-pay | Admitting: Family Medicine

## 2015-05-29 DIAGNOSIS — L853 Xerosis cutis: Secondary | ICD-10-CM

## 2015-05-29 NOTE — Telephone Encounter (Signed)
Patient came in stating that she has been experiencing dry, itchy skin on her face. Patient would like consulting on what she should do. Please follow up.

## 2015-05-30 DIAGNOSIS — L853 Xerosis cutis: Secondary | ICD-10-CM | POA: Insufficient documentation

## 2015-05-30 MED ORDER — AMMONIUM LACTATE 12 % EX CREA: TOPICAL_CREAM | CUTANEOUS | Status: DC | PRN

## 2015-05-30 NOTE — Telephone Encounter (Signed)
Patient came in stating that she has been experiencing dry, itchy skin on her face. Patient would like consulting on what she should do. Please follow up.

## 2015-05-30 NOTE — Telephone Encounter (Signed)
Lac hydrin ordered and sent to on site pharmacy If lac hydrin not available other good emollients (moisturizers) cerave, eucerin.

## 2015-05-31 NOTE — Telephone Encounter (Signed)
Pt aware Rx at pharmacy. 

## 2015-06-28 ENCOUNTER — Telehealth: Payer: Self-pay | Admitting: Family Medicine

## 2015-06-28 DIAGNOSIS — T148XXA Other injury of unspecified body region, initial encounter: Secondary | ICD-10-CM

## 2015-06-28 NOTE — Telephone Encounter (Signed)
Pt. Called requesting a med refill for her muscle relaxer and anti inflammatory medications. Pt. Also stated she did not go to work today because she is out of her med.Please f/u with pt.

## 2015-07-09 MED ORDER — DICLOFENAC SODIUM 75 MG PO TBEC: 75.0000 mg | DELAYED_RELEASE_TABLET | Freq: Two times a day (BID) | ORAL | Status: DC

## 2015-07-09 MED ORDER — CYCLOBENZAPRINE HCL 10 MG PO TABS: 10.0000 mg | ORAL_TABLET | Freq: Two times a day (BID) | ORAL | Status: DC

## 2015-07-09 NOTE — Telephone Encounter (Signed)
LVM to return call  Rx Flexeril and Voltaren refills send to [facility]

## 2015-07-11 NOTE — Telephone Encounter (Signed)
LVM to return call    RX REFILLS [facility]

## 2015-07-12 ENCOUNTER — Ambulatory Visit: Payer: Self-pay | Attending: Family Medicine | Admitting: Family Medicine

## 2015-07-12 ENCOUNTER — Other Ambulatory Visit: Payer: Self-pay | Admitting: Family Medicine

## 2015-07-12 ENCOUNTER — Encounter: Payer: Self-pay | Admitting: Family Medicine

## 2015-07-12 VITALS — BP 126/80 | HR 74 | Temp 98.0°F | Resp 16 | Ht 64.0 in | Wt 186.0 lb

## 2015-07-12 DIAGNOSIS — M5410 Radiculopathy, site unspecified: Secondary | ICD-10-CM

## 2015-07-12 DIAGNOSIS — R7989 Other specified abnormal findings of blood chemistry: Secondary | ICD-10-CM

## 2015-07-12 DIAGNOSIS — F411 Generalized anxiety disorder: Secondary | ICD-10-CM

## 2015-07-12 DIAGNOSIS — L301 Dyshidrosis [pompholyx]: Secondary | ICD-10-CM

## 2015-07-12 DIAGNOSIS — R791 Abnormal coagulation profile: Secondary | ICD-10-CM

## 2015-07-12 DIAGNOSIS — M5416 Radiculopathy, lumbar region: Principal | ICD-10-CM

## 2015-07-12 DIAGNOSIS — G8929 Other chronic pain: Secondary | ICD-10-CM

## 2015-07-12 LAB — BASIC METABOLIC PANEL
BUN: 15 mg/dL (ref 7–25)
CO2: 29 mmol/L (ref 20–31)
Calcium: 10.3 mg/dL (ref 8.6–10.4)
Chloride: 103 mmol/L (ref 98–110)
Creat: 0.73 mg/dL (ref 0.50–0.99)
Glucose, Bld: 106 mg/dL — ABNORMAL HIGH (ref 65–99)
Potassium: 4.3 mmol/L (ref 3.5–5.3)
Sodium: 142 mmol/L (ref 135–146)

## 2015-07-12 LAB — D-DIMER, QUANTITATIVE (NOT AT ARMC): D-Dimer, Quant: 0.62 ug/mL-FEU — ABNORMAL HIGH (ref 0.00–0.48)

## 2015-07-12 MED ORDER — ALPRAZOLAM 0.5 MG PO TABS: 0.5000 mg | ORAL_TABLET | Freq: Every evening | ORAL | Status: DC | PRN

## 2015-07-12 MED ORDER — ACETAMINOPHEN-CODEINE #3 300-30 MG PO TABS: 1.0000 | ORAL_TABLET | Freq: Three times a day (TID) | ORAL | Status: DC | PRN

## 2015-07-12 MED ORDER — TRIAMCINOLONE ACETONIDE 0.5 % EX OINT: 1.0000 "application " | TOPICAL_OINTMENT | Freq: Two times a day (BID) | CUTANEOUS | Status: DC

## 2015-07-12 MED ORDER — GABAPENTIN 300 MG PO CAPS: 300.0000 mg | ORAL_CAPSULE | Freq: Every day | ORAL | Status: DC

## 2015-07-12 MED ORDER — METHYLPREDNISOLONE ACETATE 40 MG/ML IJ SUSP
40.0000 mg | Freq: Once | INTRAMUSCULAR | Status: AC
  Administered 2015-07-12: 40 mg via INTRA_ARTICULAR

## 2015-07-12 MED ORDER — TRIAMCINOLONE ACETONIDE 0.5 % EX CREA: 1.0000 "application " | TOPICAL_CREAM | Freq: Three times a day (TID) | CUTANEOUS | Status: DC

## 2015-07-12 NOTE — Progress Notes (Signed)
   Subjective:    Patient ID: Meredith Church, female    DOB: 10-Sep-1956, 59 y.o.   MRN: [id number] CC: chronic low back pain, flare  HPI  1. Chronic low back pain: flare up x 2 weeks. L sided pain. Numbness in L lateral leg. Patient took a large amount of aleve. She feels like she has weakness in L leg and unable to step off. No falls.  Having trouble sleeping and feeling anxious. Has not yet received lyrica.   2. 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 Hands are flaring up. Hands are not itching currently.    Social History  Substance Use Topics  . Smoking status: Former Smoker -- 0.25 packs/day    Types: Cigarettes    Quit date: 07/18/2014  . Smokeless tobacco: Never Used     Comment: Smokes socially.  . Alcohol Use: No     Comment: Socially     Review of Systems  Constitutional: Negative for fever and chills.  Eyes: Negative for visual disturbance.  Respiratory: Negative for shortness of breath.   Cardiovascular: Negative for chest pain.  Gastrointestinal: Negative for abdominal pain and blood in stool.  Musculoskeletal: Positive for back pain and gait problem. Negative for arthralgias.  Skin: Negative for rash.  Allergic/Immunologic: Negative for immunocompromised state.  Neurological: Positive for numbness (L lateral leg ).  Hematological: Negative for adenopathy. Does not bruise/bleed easily.  Psychiatric/Behavioral: Negative for suicidal ideas and dysphoric mood.       Objective:   Physical Exam  Constitutional: She is oriented to person, place, and time. She appears well-developed and well-nourished. No distress.  HENT:  Head: Normocephalic and atraumatic.  Cardiovascular: Normal rate, regular rhythm, normal heart sounds and intact distal pulses.   Pulmonary/Chest: Effort normal and breath sounds normal.  Musculoskeletal: She exhibits no edema.  Neurological: She is alert and oriented to person, place, and time. A sensory deficit is present.  Sensory deficit L  lateral leg   Skin: Skin is warm and dry. Rash noted.  Papular rash on palms   Psychiatric: She has a normal mood and affect.   Treated with IM depo medrol 40 mg     Assessment & Plan:  Laiya was seen today for leg pain.  Diagnoses and all orders for this visit:  Chronic radicular pain of lower back -     D-dimer, quantitative (not at [facility]) -     Basic Metabolic Panel -     methylPREDNISolone acetate (DEPO-MEDROL) injection 40 mg; Inject 1 mL (40 mg total) into the articular space once. -     gabapentin (NEURONTIN) 300 MG capsule; Take 1 capsule (300 mg total) by mouth at bedtime. -     acetaminophen-codeine (TYLENOL #3) 300-30 MG tablet; Take 1-2 tablets by mouth every 8 (eight) hours as needed for moderate pain.  Anxiety state -     ALPRAZolam (XANAX) 0.5 MG tablet; Take 1 tablet (0.5 mg total) by mouth at bedtime as needed for anxiety.  Dyshidrotic hand dermatitis -     Discontinue: triamcinolone cream (KENALOG) 0.5 %; Apply 1 application topically 3 (three) times daily. -     triamcinolone ointment (KENALOG) 0.5 %; Apply 1 application topically 2 (two) times daily. Apply to hands

## 2015-07-12 NOTE — Patient Instructions (Addendum)
Meredith Church was seen today for leg pain.  Diagnoses and all orders for this visit:  Chronic radicular pain of lower back -     D-dimer, quantitative (not at [facility]) -     Basic Metabolic Panel -     methylPREDNISolone acetate (DEPO-MEDROL) injection 40 mg; Inject 1 mL (40 mg total) into the articular space once. -     gabapentin (NEURONTIN) 300 MG capsule; Take 1 capsule (300 mg total) by mouth at bedtime. -     acetaminophen-codeine (TYLENOL #3) 300-30 MG tablet; Take 1-2 tablets by mouth every 8 (eight) hours as needed for moderate pain.  Anxiety state -     ALPRAZolam (XANAX) 0.5 MG tablet; Take 1 tablet (0.5 mg total) by mouth at bedtime as needed for anxiety.  Dyshidrotic hand dermatitis -     triamcinolone cream (KENALOG) 0.5 %; Apply 1 application topically 3 (three) times daily.   Please buy a cane to use in L hand.  Apply for orange card and [place] discount so that I can order a MRI and refer you to neurosurgery if needed  F/u in 6 weeks for low back pain  Dr. Adrian Blackwater

## 2015-07-12 NOTE — Assessment & Plan Note (Signed)
A; flare up of chronic pain P: IM steroid Gabapentin Tylenol#3 D dimer to reassure patient that she does not have a DVT BMP to check Cr given recent heavy intake of NSAID

## 2015-07-12 NOTE — Progress Notes (Signed)
C/C Pain on both leg numbness on left leg x 2 weeks  Pain scale 10  Hx tobacco 2 cigarette per day

## 2015-07-12 NOTE — Assessment & Plan Note (Signed)
Ordered kenalog

## 2015-07-13 DIAGNOSIS — R7989 Other specified abnormal findings of blood chemistry: Secondary | ICD-10-CM | POA: Insufficient documentation

## 2015-07-13 NOTE — Addendum Note (Signed)
Addended by: Boykin Nearing on: 07/13/2015 09:41 AM   Modules accepted: Orders

## 2015-07-16 ENCOUNTER — Telehealth: Payer: Self-pay | Admitting: *Deleted

## 2015-07-16 NOTE — Telephone Encounter (Signed)
Order has to be placed prior to scheduling.

## 2015-08-15 ENCOUNTER — Telehealth: Payer: Self-pay | Admitting: Family Medicine

## 2015-08-15 NOTE — Telephone Encounter (Signed)
Patient came into office and would like to receive her lab results......pt. Also dropped off application for renewal of handicap plaque....please follow up with patient

## 2015-08-16 NOTE — Telephone Encounter (Signed)
LVM form at front office ready to be pick up  

## 2015-08-17 NOTE — Telephone Encounter (Signed)
LVM to return call  Form mail to pt

## 2015-08-29 ENCOUNTER — Ambulatory Visit (HOSPITAL_COMMUNITY)
Admission: RE | Admit: 2015-08-29 | Discharge: 2015-08-29 | Disposition: A | Discharge status: Home / Self Care | Payer: Self-pay | Source: Ambulatory Visit | Attending: Family Medicine | Admitting: Family Medicine

## 2015-08-29 ENCOUNTER — Other Ambulatory Visit: Payer: Self-pay | Admitting: *Deleted

## 2015-08-29 ENCOUNTER — Other Ambulatory Visit: Payer: Self-pay | Admitting: Family Medicine

## 2015-08-29 DIAGNOSIS — R52 Pain, unspecified: Secondary | ICD-10-CM

## 2015-08-29 DIAGNOSIS — I10 Essential (primary) hypertension: Secondary | ICD-10-CM

## 2015-08-29 DIAGNOSIS — M7989 Other specified soft tissue disorders: Secondary | ICD-10-CM | POA: Insufficient documentation

## 2015-08-29 DIAGNOSIS — M79659 Pain in unspecified thigh: Secondary | ICD-10-CM | POA: Insufficient documentation

## 2015-08-29 MED ORDER — AMLODIPINE BESYLATE 10 MG PO TABS: 10.0000 mg | ORAL_TABLET | Freq: Every day | ORAL | Status: DC

## 2015-08-29 MED ORDER — CYCLOBENZAPRINE HCL 10 MG PO TABS: 10.0000 mg | ORAL_TABLET | Freq: Two times a day (BID) | ORAL | Status: DC

## 2015-08-29 NOTE — Telephone Encounter (Signed)
Patient called wanting lab results and to speak to nurse

## 2015-08-29 NOTE — Progress Notes (Signed)
*  PRELIMINARY RESULTS* Vascular Ultrasound Lower extremity venous duplex has been completed.  Preliminary findings: No evidence of DVT or baker's cyst.   Attempted call report to Dr. Adrian Blackwater. Left voice message with results on triage line.   Landry Mellow, RDMS, RVT  08/29/2015, 4:27 PM

## 2015-08-30 NOTE — Telephone Encounter (Signed)
Lab results given to pt  Vas Lab appointment information given

## 2015-08-31 ENCOUNTER — Encounter: Payer: Self-pay | Admitting: *Deleted

## 2015-08-31 ENCOUNTER — Telehealth: Payer: Self-pay | Admitting: *Deleted

## 2015-08-31 NOTE — Telephone Encounter (Signed)
Date of birth verified by pt  Doppler results given  Pt verbalized understanding

## 2015-08-31 NOTE — Telephone Encounter (Signed)
-----   Message from Boykin Nearing, MD sent at 08/31/2015  9:04 AM EST ----- Venous doppler negative for DVT

## 2015-08-31 NOTE — Telephone Encounter (Signed)
Note provided

## 2015-09-20 ENCOUNTER — Telehealth: Payer: Self-pay | Admitting: Family Medicine

## 2015-09-20 DIAGNOSIS — M5416 Radiculopathy, lumbar region: Principal | ICD-10-CM

## 2015-09-20 DIAGNOSIS — G8929 Other chronic pain: Secondary | ICD-10-CM

## 2015-09-20 MED FILL — ?METOPROLOL 50 MG TABLET: 50 | 30 days supply | Qty: 60 | Fill #2

## 2015-09-20 MED FILL — ?AMLODIPINE BESYLATE 10 MG: 10 | 30 days supply | Qty: 30 | Fill #1

## 2015-09-20 NOTE — Telephone Encounter (Signed)
Pt. Dropped off FMLA paperwork to be filled out.

## 2015-09-21 NOTE — Telephone Encounter (Signed)
Paperwork received and request noted. MRI ordered, please scheduled and inform patient   FMLA paperwork and letter will be available within 2 weeks per clinic policy

## 2015-09-28 ENCOUNTER — Telehealth: Payer: Self-pay | Admitting: Family Medicine

## 2015-09-28 DIAGNOSIS — G8929 Other chronic pain: Secondary | ICD-10-CM

## 2015-09-28 DIAGNOSIS — M5416 Radiculopathy, lumbar region: Principal | ICD-10-CM

## 2015-09-28 MED ORDER — ACETAMINOPHEN-CODEINE #3 300-30 MG PO TABS: 1.0000 | ORAL_TABLET | Freq: Three times a day (TID) | ORAL | Status: DC | PRN

## 2015-09-28 NOTE — Telephone Encounter (Signed)
Patient walked in today I saw her in the waiting room  She is having worsening back pain She is out of pain medicine Refilled tylenol #3 She will get f/u appt with me

## 2015-09-28 NOTE — Telephone Encounter (Signed)
Letter written  Up front for pick up

## 2015-09-28 NOTE — Telephone Encounter (Signed)
LVM to return call Form ready at front office   MRI appointment on Feb 2 at 10:00am arriving 15 min early  At [facility] radiology

## 2015-10-05 ENCOUNTER — Ambulatory Visit: Payer: Self-pay | Admitting: Family Medicine

## 2015-10-05 ENCOUNTER — Telehealth: Payer: Self-pay | Admitting: Family Medicine

## 2015-10-05 NOTE — Telephone Encounter (Signed)
Pt. Came in to her appointment on 10/05/15 and her appointment was at 5:00 and pt arrived at 5:17. I advised the nurse of pt. Being here and the pt. Had to be rescheduled. Pt. Refused to reschedule and left the building.

## 2015-10-07 ENCOUNTER — Telehealth: Payer: Self-pay | Admitting: Family Medicine

## 2015-10-07 NOTE — Telephone Encounter (Signed)
Lenna Sciara is calling on behalf of patients employer "[employer]" and is calling in regards to patients certificate. She mentioned that their might be a possible error on the dates covered for the reduced schedule, because it shows from 08/28/15- 10/21/14. Is this supposed to be 2017? Please follow up with [facility] thank you.

## 2015-10-08 ENCOUNTER — Encounter (HOSPITAL_COMMUNITY): Payer: Self-pay | Admitting: Emergency Medicine

## 2015-10-08 ENCOUNTER — Emergency Department (HOSPITAL_COMMUNITY)
Admission: EM | Admit: 2015-10-08 | Discharge: 2015-10-08 | Disposition: A | Discharge status: Home / Self Care | Payer: Self-pay | Attending: Emergency Medicine | Admitting: Emergency Medicine

## 2015-10-08 DIAGNOSIS — Z8639 Personal history of other endocrine, nutritional and metabolic disease: Secondary | ICD-10-CM | POA: Insufficient documentation

## 2015-10-08 DIAGNOSIS — M79652 Pain in left thigh: Secondary | ICD-10-CM | POA: Insufficient documentation

## 2015-10-08 DIAGNOSIS — Z87891 Personal history of nicotine dependence: Secondary | ICD-10-CM | POA: Insufficient documentation

## 2015-10-08 DIAGNOSIS — M79662 Pain in left lower leg: Secondary | ICD-10-CM | POA: Insufficient documentation

## 2015-10-08 DIAGNOSIS — Z7952 Long term (current) use of systemic steroids: Secondary | ICD-10-CM | POA: Insufficient documentation

## 2015-10-08 DIAGNOSIS — Z79899 Other long term (current) drug therapy: Secondary | ICD-10-CM | POA: Insufficient documentation

## 2015-10-08 DIAGNOSIS — M5432 Sciatica, left side: Secondary | ICD-10-CM | POA: Insufficient documentation

## 2015-10-08 DIAGNOSIS — F419 Anxiety disorder, unspecified: Secondary | ICD-10-CM | POA: Insufficient documentation

## 2015-10-08 DIAGNOSIS — I10 Essential (primary) hypertension: Secondary | ICD-10-CM | POA: Insufficient documentation

## 2015-10-08 MED ORDER — HYDROCODONE-ACETAMINOPHEN 5-325 MG PO TABS: 1.0000 | ORAL_TABLET | Freq: Four times a day (QID) | ORAL | Status: DC | PRN

## 2015-10-08 MED ORDER — HYDROCODONE-ACETAMINOPHEN 5-325 MG PO TABS
1.0000 | ORAL_TABLET | Freq: Once | ORAL | Status: AC
  Administered 2015-10-08: 1 via ORAL
  Filled 2015-10-08: qty 1

## 2015-10-08 MED ORDER — PREDNISONE 50 MG PO TABS: ORAL_TABLET | ORAL | Status: DC

## 2015-10-08 NOTE — ED Provider Notes (Signed)
CSN: [id number]     Arrival date & time 10/08/15  1044 History   First MD Initiated Contact with Patient 10/08/15 1251     Chief Complaint  Patient presents with  . Numbness   HPI  Meredith Church is a 60 year old female with PMHx of HTN, anxiety and sciatica presenting with leg pain and numbness. She reports onset of symptoms 3-4 months ago. She states that the sensation is in both legs but left is more severe than right. The numbness radiates from the buttocks into her toes. The pain and numbness is exacerbated by sitting for long periods of time and walking. She states that the pain is so severe that "walking is unbearable" and she has recently filled out FMLA paperwork. She reports only taking aleve for symptoms relief and states this does not help. She does report receiving a steroid shot last fall which helped alleviate her pain for a short period. She is followed by Dr. Loma Messing for this complaint. She recently had a DVT study done which was negative. She receives tylenol #3 from her PCP for pain control. She has also tried neurontin which she reports "did nothing for the pain". She has an MRI of her lumbar region scheduled in the beginning of February to evaluate the extent of her sciatica. Her PCP refilled her tylenol #3 refill 10 days ago. She had an appointment with her PCP 2 days ago to discuss her leg pain but she was late and not able to be seen. She reports being extremely upset by this and refused to schedule a makeup appointment. She states there has been no change in her leg pain since onset. Denies bowel or bladder incontinence, numbness of the groin or fevers.   Past Medical History  Diagnosis Date  . Anxiety Dx 2007  . Hyperlipidemia Dx 2010  . Hypertension Dx 2010   Past Surgical History  Procedure Laterality Date  . Tibia fracture surgery      right  . Abdominal hysterectomy     Family History  Problem Relation Age of Onset  . Diabetes Mother   . Diabetes Sister     Social History  Substance Use Topics  . Smoking status: Former Smoker -- 0.25 packs/day    Types: Cigarettes    Quit date: 07/18/2014  . Smokeless tobacco: Never Used     Comment: Smokes socially.  . Alcohol Use: No     Comment: Socially    OB History    No data available     Review of Systems  Musculoskeletal: Positive for myalgias and gait problem. Negative for back pain, joint swelling and arthralgias.  Skin: Negative for rash.  Neurological: Positive for numbness. Negative for weakness.  All other systems reviewed and are negative.     Allergies  Review of patient's allergies indicates no known allergies.  Home Medications   Prior to Admission medications   Medication Sig Start Date End Date Taking? Authorizing Provider  acetaminophen-codeine (TYLENOL #3) 300-30 MG tablet Take 1-2 tablets by mouth every 8 (eight) hours as needed for moderate pain. 09/28/15  Yes Josalyn [facility], MD  ALPRAZolam (XANAX) 0.5 MG tablet Take 1 tablet (0.5 mg total) by mouth at bedtime as needed for anxiety. 07/12/15  Yes Josalyn Funches, MD  amLODipine (NORVASC) 10 MG tablet Take 1 tablet (10 mg total) by mouth daily. 08/29/15  Yes Josalyn Funches, MD  ammonium lactate (LAC-HYDRIN) 12 % cream Apply topically as needed for dry skin. 05/30/15  Yes  Boykin Nearing, MD  Biotin 1000 MCG tablet Take 2,000 mcg by mouth daily.   Yes Historical Provider, MD  L-LYSINE PO Take 1 tablet by mouth daily.   Yes Historical Provider, MD  metoprolol (LOPRESSOR) 50 MG tablet Take 1 tablet (50 mg total) by mouth 2 (two) times daily. 05/07/15  Yes Josalyn Funches, MD  triamcinolone ointment (KENALOG) 0.5 % Apply 1 application topically 2 (two) times daily. Apply to hands 07/12/15  Yes Boykin Nearing, MD  vitamin C (ASCORBIC ACID) 500 MG tablet Take 500 mg by mouth daily.   Yes Historical Provider, MD  albuterol (PROVENTIL HFA;VENTOLIN HFA) 108 (90 BASE) MCG/ACT inhaler Inhale 2 puffs into the lungs every 6 (six)  hours as needed for wheezing or shortness of breath. Patient not taking: Reported on 10/08/2015 12/18/14   Micheline Chapman, NP  cyclobenzaprine (FLEXERIL) 10 MG tablet Take 1 tablet (10 mg total) by mouth 2 (two) times daily. Patient not taking: Reported on 10/08/2015 08/29/15   Boykin Nearing, MD  diclofenac (VOLTAREN) 75 MG EC tablet Take 1 tablet (75 mg total) by mouth 2 (two) times daily. Patient not taking: Reported on 10/08/2015 07/09/15   Boykin Nearing, MD  gabapentin (NEURONTIN) 300 MG capsule Take 1 capsule (300 mg total) by mouth at bedtime. Patient not taking: Reported on 10/08/2015 07/12/15   Boykin Nearing, MD  HYDROcodone-acetaminophen (NORCO/VICODIN) 5-325 MG tablet Take 1 tablet by mouth every 6 (six) hours as needed. 10/08/15   Duvan Mousel, PA-C  predniSONE (DELTASONE) 50 MG tablet Take 1 tablet once a day for 5 days 10/08/15   Anaalicia Reimann, PA-C   BP 131/81 mmHg  Pulse 80  Temp(Src) 98 F (36.7 C) (Oral)  Resp 16  SpO2 95% Physical Exam  Constitutional: She appears well-developed and well-nourished. No distress.  HENT:  Head: Normocephalic and atraumatic.  Eyes: Conjunctivae are normal. Right eye exhibits no discharge. Left eye exhibits no discharge. No scleral icterus.  Neck: Normal range of motion.  Cardiovascular: Normal rate, regular rhythm and intact distal pulses.   Pedal pulses palpable. Cap refill < 3 seconds  Pulmonary/Chest: Effort normal. No respiratory distress.  Musculoskeletal: Normal range of motion.  Generalized tenderness over left lateral thigh and calf. No tenderness over right leg. No point tenderness or pain over large joints. FROM of right lower extremity. Limited ROM of left hip due to pain. FROM of left knee, ankle and toes intact. No swelling of calves or joints. No deformities. Pt is able to ambulate though with some discomfort. Positive straight leg raises bilaterally.  Neurological: She is alert. Coordination normal.  5/5 ankle strength  bilaterally. Pt refusing formal hip strength testing due to pain but is able to swing her legs to the side of the stretcher and stand without assistance. Sensation intact throughout lower extremities.   Skin: Skin is warm and dry. No rash noted. No erythema.  Psychiatric: She has a normal mood and affect. Her behavior is normal.  Nursing note and vitals reviewed.   ED Course  Procedures (including critical care time) Labs Review Labs Reviewed - No data to display  Imaging Review No results found. I have personally reviewed and evaluated these images and lab results as part of my medical decision-making.   EKG Interpretation None      MDM   Final diagnoses:  Sciatica of left side   Patient presenting with chronic sciatica pain left>right. Right leg is neurovascularly intact with FROM. Left leg is neurovascularly intact with restricted ROM  of left hip secondary to pain. Positive bilateral straight leg raise. Chart review shows this presentation is typical of her chronic pain. She reports no change in symptoms today. She states she is upset that her PCP refused to see her 2 days ago when she was late to her appointment. She states "I just want this pain to go away today". Pain managed in ED with vicodin. Pt is able to ambulate with a steady gait though it is uncomfortable. No indication for imaging at this time. Will discharge on steroid burst x 5 days and with #8 vicodin for breakthrough pain. Pt advised to follow up with PCP to reschedule her missed appointment. Pt also requesting orthopedics information "in case [facility] can't help me". Strongly encouraged pt to keep her MRI appointment on 2/2. Return precautions discussed at bedside and given in discharge paperwork. Pt is stable for discharge.      Lahoma Crocker Burman Bruington, PA-C 10/08/15 1749  Charlesetta Shanks, MD 10/09/15 [phone]

## 2015-10-08 NOTE — Discharge Instructions (Signed)
Schedule a follow up appointment with your PCP. Keep your scheduled MRI appointment.    Sciatica With Rehab The sciatic nerve runs from the back down the leg and is responsible for sensation and control of the muscles in the back (posterior) side of the thigh, lower leg, and foot. Sciatica is a condition that is characterized by inflammation of this nerve.  SYMPTOMS   Signs of nerve damage, including numbness and/or weakness along the posterior side of the lower extremity.  Pain in the back of the thigh that may also travel down the leg.  Pain that worsens when sitting for long periods of time.  Occasionally, pain in the back or buttock. CAUSES  Inflammation of the sciatic nerve is the cause of sciatica. The inflammation is due to something irritating the nerve. Common sources of irritation include:  Sitting for long periods of time.  Direct trauma to the nerve.  Arthritis of the spine.  Herniated or ruptured disk.  Slipping of the vertebrae (spondylolisthesis).  Pressure from soft tissues, such as muscles or ligament-like tissue (fascia). RISK INCREASES WITH:  Sports that place pressure or stress on the spine (football or weightlifting).  Poor strength and flexibility.  Failure to warm up properly before activity.  Family history of low back pain or disk disorders.  Previous back injury or surgery.  Poor body mechanics, especially when lifting, or poor posture. PREVENTION   Warm up and stretch properly before activity.  Maintain physical fitness:  Strength, flexibility, and endurance.  Cardiovascular fitness.  Learn and use proper technique, especially with posture and lifting. When possible, have coach correct improper technique.  Avoid activities that place stress on the spine. PROGNOSIS If treated properly, then sciatica usually resolves within 6 weeks. However, occasionally surgery is necessary.  RELATED COMPLICATIONS   Permanent nerve damage, including  pain, numbness, tingle, or weakness.  Chronic back pain.  Risks of surgery: infection, bleeding, nerve damage, or damage to surrounding tissues. TREATMENT Treatment initially involves resting from any activities that aggravate your symptoms. The use of ice and medication may help reduce pain and inflammation. The use of strengthening and stretching exercises may help reduce pain with activity. These exercises may be performed at home or with referral to a therapist. A therapist may recommend further treatments, such as transcutaneous electronic nerve stimulation (TENS) or ultrasound. Your caregiver may recommend corticosteroid injections to help reduce inflammation of the sciatic nerve. If symptoms persist despite non-surgical (conservative) treatment, then surgery may be recommended. MEDICATION  If pain medication is necessary, then nonsteroidal anti-inflammatory medications, such as aspirin and ibuprofen, or other minor pain relievers, such as acetaminophen, are often recommended.  Do not take pain medication for 7 days before surgery.  Prescription pain relievers may be given if deemed necessary by your caregiver. Use only as directed and only as much as you need.  Ointments applied to the skin may be helpful.  Corticosteroid injections may be given by your caregiver. These injections should be reserved for the most serious cases, because they may only be given a certain number of times. HEAT AND COLD  Cold treatment (icing) relieves pain and reduces inflammation. Cold treatment should be applied for 10 to 15 minutes every 2 to 3 hours for inflammation and pain and immediately after any activity that aggravates your symptoms. Use ice packs or massage the area with a piece of ice (ice massage).  Heat treatment may be used prior to performing the stretching and strengthening activities prescribed by your caregiver,  physical therapist, or athletic trainer. Use a heat pack or soak the injury in  warm water. SEEK MEDICAL CARE IF:  Treatment seems to offer no benefit, or the condition worsens.  Any medications produce adverse side effects. EXERCISES  RANGE OF MOTION (ROM) AND STRETCHING EXERCISES - Sciatica Most people with sciatic will find that their symptoms worsen with either excessive bending forward (flexion) or arching at the low back (extension). The exercises which will help resolve your symptoms will focus on the opposite motion. Your physician, physical therapist or athletic trainer will help you determine which exercises will be most helpful to resolve your low back pain. Do not complete any exercises without first consulting with your clinician. Discontinue any exercises which worsen your symptoms until you speak to your clinician. If you have pain, numbness or tingling which travels down into your buttocks, leg or foot, the goal of the therapy is for these symptoms to move closer to your back and eventually resolve. Occasionally, these leg symptoms will get better, but your low back pain may worsen; this is typically an indication of progress in your rehabilitation. Be certain to be very alert to any changes in your symptoms and the activities in which you participated in the 24 hours prior to the change. Sharing this information with your clinician will allow him/her to most efficiently treat your condition. These exercises may help you when beginning to rehabilitate your injury. Your symptoms may resolve with or without further involvement from your physician, physical therapist or athletic trainer. While completing these exercises, remember:   Restoring tissue flexibility helps normal motion to return to the joints. This allows healthier, less painful movement and activity.  An effective stretch should be held for at least 30 seconds.  A stretch should never be painful. You should only feel a gentle lengthening or release in the stretched tissue. FLEXION RANGE OF MOTION AND  STRETCHING EXERCISES: STRETCH - Flexion, Single Knee to Chest   Lie on a firm bed or floor with both legs extended in front of you.  Keeping one leg in contact with the floor, bring your opposite knee to your chest. Hold your leg in place by either grabbing behind your thigh or at your knee.  Pull until you feel a gentle stretch in your low back. Hold __________ seconds.  Slowly release your grasp and repeat the exercise with the opposite side. Repeat __________ times. Complete this exercise __________ times per day.  STRETCH - Flexion, Double Knee to Chest  Lie on a firm bed or floor with both legs extended in front of you.  Keeping one leg in contact with the floor, bring your opposite knee to your chest.  Tense your stomach muscles to support your back and then lift your other knee to your chest. Hold your legs in place by either grabbing behind your thighs or at your knees.  Pull both knees toward your chest until you feel a gentle stretch in your low back. Hold __________ seconds.  Tense your stomach muscles and slowly return one leg at a time to the floor. Repeat __________ times. Complete this exercise __________ times per day.  STRETCH - Low Trunk Rotation   Lie on a firm bed or floor. Keeping your legs in front of you, bend your knees so they are both pointed toward the ceiling and your feet are flat on the floor.  Extend your arms out to the side. This will stabilize your upper body by keeping your shoulders in  contact with the floor.  Gently and slowly drop both knees together to one side until you feel a gentle stretch in your low back. Hold for __________ seconds.  Tense your stomach muscles to support your low back as you bring your knees back to the starting position. Repeat the exercise to the other side. Repeat __________ times. Complete this exercise __________ times per day  EXTENSION RANGE OF MOTION AND FLEXIBILITY EXERCISES: STRETCH - Extension, Prone on  Elbows  Lie on your stomach on the floor, a bed will be too soft. Place your palms about shoulder width apart and at the height of your head.  Place your elbows under your shoulders. If this is too painful, stack pillows under your chest.  Allow your body to relax so that your hips drop lower and make contact more completely with the floor.  Hold this position for __________ seconds.  Slowly return to lying flat on the floor. Repeat __________ times. Complete this exercise __________ times per day.  RANGE OF MOTION - Extension, Prone Press Ups  Lie on your stomach on the floor, a bed will be too soft. Place your palms about shoulder width apart and at the height of your head.  Keeping your back as relaxed as possible, slowly straighten your elbows while keeping your hips on the floor. You may adjust the placement of your hands to maximize your comfort. As you gain motion, your hands will come more underneath your shoulders.  Hold this position __________ seconds.  Slowly return to lying flat on the floor. Repeat __________ times. Complete this exercise __________ times per day.  STRENGTHENING EXERCISES - Sciatica  These exercises may help you when beginning to rehabilitate your injury. These exercises should be done near your "sweet spot." This is the neutral, low-back arch, somewhere between fully rounded and fully arched, that is your least painful position. When performed in this safe range of motion, these exercises can be used for people who have either a flexion or extension based injury. These exercises may resolve your symptoms with or without further involvement from your physician, physical therapist or athletic trainer. While completing these exercises, remember:   Muscles can gain both the endurance and the strength needed for everyday activities through controlled exercises.  Complete these exercises as instructed by your physician, physical therapist or athletic trainer.  Progress with the resistance and repetition exercises only as your caregiver advises.  You may experience muscle soreness or fatigue, but the pain or discomfort you are trying to eliminate should never worsen during these exercises. If this pain does worsen, stop and make certain you are following the directions exactly. If the pain is still present after adjustments, discontinue the exercise until you can discuss the trouble with your clinician. STRENGTHENING - Deep Abdominals, Pelvic Tilt   Lie on a firm bed or floor. Keeping your legs in front of you, bend your knees so they are both pointed toward the ceiling and your feet are flat on the floor.  Tense your lower abdominal muscles to press your low back into the floor. This motion will rotate your pelvis so that your tail bone is scooping upwards rather than pointing at your feet or into the floor.  With a gentle tension and even breathing, hold this position for __________ seconds. Repeat __________ times. Complete this exercise __________ times per day.  STRENGTHENING - Abdominals, Crunches   Lie on a firm bed or floor. Keeping your legs in front of you, bend your  knees so they are both pointed toward the ceiling and your feet are flat on the floor. Cross your arms over your chest.  Slightly tip your chin down without bending your neck.  Tense your abdominals and slowly lift your trunk high enough to just clear your shoulder blades. Lifting higher can put excessive stress on the low back and does not further strengthen your abdominal muscles.  Control your return to the starting position. Repeat __________ times. Complete this exercise __________ times per day.  STRENGTHENING - Quadruped, Opposite UE/LE Lift  Assume a hands and knees position on a firm surface. Keep your hands under your shoulders and your knees under your hips. You may place padding under your knees for comfort.  Find your neutral spine and gently tense your abdominal  muscles so that you can maintain this position. Your shoulders and hips should form a rectangle that is parallel with the floor and is not twisted.  Keeping your trunk steady, lift your right hand no higher than your shoulder and then your left leg no higher than your hip. Make sure you are not holding your breath. Hold this position __________ seconds.  Continuing to keep your abdominal muscles tense and your back steady, slowly return to your starting position. Repeat with the opposite arm and leg. Repeat __________ times. Complete this exercise __________ times per day.  STRENGTHENING - Abdominals and Quadriceps, Straight Leg Raise   Lie on a firm bed or floor with both legs extended in front of you.  Keeping one leg in contact with the floor, bend the other knee so that your foot can rest flat on the floor.  Find your neutral spine, and tense your abdominal muscles to maintain your spinal position throughout the exercise.  Slowly lift your straight leg off the floor about 6 inches for a count of 15, making sure to not hold your breath.  Still keeping your neutral spine, slowly lower your leg all the way to the floor. Repeat this exercise with each leg __________ times. Complete this exercise __________ times per day. POSTURE AND BODY MECHANICS CONSIDERATIONS - Sciatica Keeping correct posture when sitting, standing or completing your activities will reduce the stress put on different body tissues, allowing injured tissues a chance to heal and limiting painful experiences. The following are general guidelines for improved posture. Your physician or physical therapist will provide you with any instructions specific to your needs. While reading these guidelines, remember:  The exercises prescribed by your provider will help you have the flexibility and strength to maintain correct postures.  The correct posture provides the optimal environment for your joints to work. All of your joints have  less wear and tear when properly supported by a spine with good posture. This means you will experience a healthier, less painful body.  Correct posture must be practiced with all of your activities, especially prolonged sitting and standing. Correct posture is as important when doing repetitive low-stress activities (typing) as it is when doing a single heavy-load activity (lifting). RESTING POSITIONS Consider which positions are most painful for you when choosing a resting position. If you have pain with flexion-based activities (sitting, bending, stooping, squatting), choose a position that allows you to rest in a less flexed posture. You would want to avoid curling into a fetal position on your side. If your pain worsens with extension-based activities (prolonged standing, working overhead), avoid resting in an extended position such as sleeping on your stomach. Most people will find more comfort  when they rest with their spine in a more neutral position, neither too rounded nor too arched. Lying on a non-sagging bed on your side with a pillow between your knees, or on your back with a pillow under your knees will often provide some relief. Keep in mind, being in any one position for a prolonged period of time, no matter how correct your posture, can still lead to stiffness. PROPER SITTING POSTURE In order to minimize stress and discomfort on your spine, you must sit with correct posture Sitting with good posture should be effortless for a healthy body. Returning to good posture is a gradual process. Many people can work toward this most comfortably by using various supports until they have the flexibility and strength to maintain this posture on their own. When sitting with proper posture, your ears will fall over your shoulders and your shoulders will fall over your hips. You should use the back of the chair to support your upper back. Your low back will be in a neutral position, just slightly arched.  You may place a small pillow or folded towel at the base of your low back for support.  When working at a desk, create an environment that supports good, upright posture. Without extra support, muscles fatigue and lead to excessive strain on joints and other tissues. Keep these recommendations in mind: CHAIR:   A chair should be able to slide under your desk when your back makes contact with the back of the chair. This allows you to work closely.  The chair's height should allow your eyes to be level with the upper part of your monitor and your hands to be slightly lower than your elbows. BODY POSITION  Your feet should make contact with the floor. If this is not possible, use a foot rest.  Keep your ears over your shoulders. This will reduce stress on your neck and low back. INCORRECT SITTING POSTURES   If you are feeling tired and unable to assume a healthy sitting posture, do not slouch or slump. This puts excessive strain on your back tissues, causing more damage and pain. Healthier options include:  Using more support, like a lumbar pillow.  Switching tasks to something that requires you to be upright or walking.  Talking a brief walk.  Lying down to rest in a neutral-spine position. PROLONGED STANDING WHILE SLIGHTLY LEANING FORWARD  When completing a task that requires you to lean forward while standing in one place for a long time, place either foot up on a stationary 2-4 inch high object to help maintain the best posture. When both feet are on the ground, the low back tends to lose its slight inward curve. If this curve flattens (or becomes too large), then the back and your other joints will experience too much stress, fatigue more quickly and can cause pain.  CORRECT STANDING POSTURES Proper standing posture should be assumed with all daily activities, even if they only take a few moments, like when brushing your teeth. As in sitting, your ears should fall over your shoulders and  your shoulders should fall over your hips. You should keep a slight tension in your abdominal muscles to brace your spine. Your tailbone should point down to the ground, not behind your body, resulting in an over-extended swayback posture.  INCORRECT STANDING POSTURES  Common incorrect standing postures include a forward head, locked knees and/or an excessive swayback. WALKING Walk with an upright posture. Your ears, shoulders and hips should all line-up.  PROLONGED ACTIVITY IN A FLEXED POSITION When completing a task that requires you to bend forward at your waist or lean over a low surface, try to find a way to stabilize 3 of 4 of your limbs. You can place a hand or elbow on your thigh or rest a knee on the surface you are reaching across. This will provide you more stability so that your muscles do not fatigue as quickly. By keeping your knees relaxed, or slightly bent, you will also reduce stress across your low back. CORRECT LIFTING TECHNIQUES DO :   Assume a wide stance. This will provide you more stability and the opportunity to get as close as possible to the object which you are lifting.  Tense your abdominals to brace your spine; then bend at the knees and hips. Keeping your back locked in a neutral-spine position, lift using your leg muscles. Lift with your legs, keeping your back straight.  Test the weight of unknown objects before attempting to lift them.  Try to keep your elbows locked down at your sides in order get the best strength from your shoulders when carrying an object.  Always ask for help when lifting heavy or awkward objects. INCORRECT LIFTING TECHNIQUES DO NOT:   Lock your knees when lifting, even if it is a small object.  Bend and twist. Pivot at your feet or move your feet when needing to change directions.  Assume that you cannot safely pick up a paperclip without proper posture.   This information is not intended to replace advice given to you by your health  care provider. Make sure you discuss any questions you have with your health care provider.   Document Released: 09/01/2005 Document Revised: 01/16/2015 Document Reviewed: 12/14/2008 Elsevier Interactive Patient Education [employer].

## 2015-10-08 NOTE — ED Notes (Signed)
Pt c/o bilateral numbness x 2 weeks beginning in buttocks and radiating down leg, worse on left. Skin to left lateral calf is abnormally warm. No tenderness or masses to palpation.  No Hx of DM. Pt reports getting imaging done 2 weeks ago to rule out thrombi, test was negative.

## 2015-10-08 NOTE — ED Notes (Signed)
Bed: WA23 Expected date:  Expected time:  Means of arrival:  Comments: Pt getting dressed. 

## 2015-10-09 NOTE — Telephone Encounter (Signed)
Please call to correct dates,  "08/28/15- 10/22/15".  Per Dr. Adrian Blackwater

## 2015-10-10 NOTE — Telephone Encounter (Signed)
Called Meredith Church She stated has the date, ignore fax

## 2015-10-18 ENCOUNTER — Emergency Department (HOSPITAL_COMMUNITY): Payer: Self-pay

## 2015-10-18 ENCOUNTER — Ambulatory Visit (HOSPITAL_COMMUNITY)
Admission: RE | Admit: 2015-10-18 | Discharge: 2015-10-18 | Disposition: A | Discharge status: Home / Self Care | Payer: Self-pay | Source: Ambulatory Visit | Attending: Family Medicine | Admitting: Family Medicine

## 2015-10-18 ENCOUNTER — Encounter (HOSPITAL_COMMUNITY): Payer: Self-pay | Admitting: Emergency Medicine

## 2015-10-18 ENCOUNTER — Emergency Department (HOSPITAL_COMMUNITY)
Admission: EM | Admit: 2015-10-18 | Discharge: 2015-10-18 | Discharge status: Against Medical Advice | Payer: Self-pay | Attending: Emergency Medicine | Admitting: Emergency Medicine

## 2015-10-18 DIAGNOSIS — Z7952 Long term (current) use of systemic steroids: Secondary | ICD-10-CM | POA: Insufficient documentation

## 2015-10-18 DIAGNOSIS — F419 Anxiety disorder, unspecified: Secondary | ICD-10-CM | POA: Insufficient documentation

## 2015-10-18 DIAGNOSIS — F17210 Nicotine dependence, cigarettes, uncomplicated: Secondary | ICD-10-CM | POA: Insufficient documentation

## 2015-10-18 DIAGNOSIS — M6281 Muscle weakness (generalized): Secondary | ICD-10-CM | POA: Insufficient documentation

## 2015-10-18 DIAGNOSIS — Z8639 Personal history of other endocrine, nutritional and metabolic disease: Secondary | ICD-10-CM | POA: Insufficient documentation

## 2015-10-18 DIAGNOSIS — R200 Anesthesia of skin: Secondary | ICD-10-CM | POA: Insufficient documentation

## 2015-10-18 DIAGNOSIS — Z79899 Other long term (current) drug therapy: Secondary | ICD-10-CM | POA: Insufficient documentation

## 2015-10-18 DIAGNOSIS — I10 Essential (primary) hypertension: Secondary | ICD-10-CM | POA: Insufficient documentation

## 2015-10-18 DIAGNOSIS — G8929 Other chronic pain: Secondary | ICD-10-CM

## 2015-10-18 DIAGNOSIS — M5416 Radiculopathy, lumbar region: Principal | ICD-10-CM

## 2015-10-18 DIAGNOSIS — M545 Low back pain: Secondary | ICD-10-CM | POA: Insufficient documentation

## 2015-10-18 MED ORDER — HYDROCODONE-ACETAMINOPHEN 5-325 MG PO TABS
2.0000 | ORAL_TABLET | Freq: Once | ORAL | Status: AC
  Administered 2015-10-18: 2 via ORAL
  Filled 2015-10-18: qty 2

## 2015-10-18 MED ORDER — HYDROMORPHONE HCL 1 MG/ML IJ SOLN
1.0000 mg | Freq: Once | INTRAMUSCULAR | Status: AC
  Administered 2015-10-18: 1 mg via INTRAVENOUS
  Filled 2015-10-18: qty 1

## 2015-10-18 MED ORDER — HYDROMORPHONE HCL 1 MG/ML IJ SOLN
1.0000 mg | Freq: Once | INTRAMUSCULAR | Status: AC
  Administered 2015-10-18: 1 mg via INTRAMUSCULAR
  Filled 2015-10-18: qty 1

## 2015-10-18 MED ORDER — KETOROLAC TROMETHAMINE 30 MG/ML IJ SOLN
30.0000 mg | Freq: Once | INTRAMUSCULAR | Status: AC
  Administered 2015-10-18: 30 mg via INTRAVENOUS
  Filled 2015-10-18: qty 1

## 2015-10-18 MED ORDER — HYDROMORPHONE HCL 1 MG/ML IJ SOLN: 1.0000 mg | Freq: Once | INTRAMUSCULAR | Status: DC

## 2015-10-18 NOTE — ED Notes (Signed)
PT taken to MRI at this time.

## 2015-10-18 NOTE — ED Notes (Signed)
MD at bedside. 

## 2015-10-18 NOTE — ED Provider Notes (Signed)
CSN: [id number]     Arrival date & time 10/18/15  1057 History   First MD Initiated Contact with Patient 10/18/15 1124     Chief Complaint  Patient presents with  . Leg Pain     (Consider location/radiation/quality/duration/timing/severity/associated sxs/prior Treatment) HPI 60 year old female who presents with progressively worsening low back pain radiating down the left leg. This is been an ongoing issue over the past 4 months, which suddenly developed while dancing at a wedding. States that this pain travels from around her left buttock down the left side of her leg into her heel. It has been associated with numbness in the left lower extremity and more recently weakness. Was recently seen in the emergency department 3 days ago for evaluation of this, and was given Vicodin and steroid burst. Has been following up with her primary care provider during this time and has also been taking Tylenol 3. Scheduled for MRI of her lumbar spine today, but unable to tolerate lying flat in the MRI machine, so was sent to the ED for better pain control. Denies any urinary retention, urinary or bowel incontinence. States that over the past 2-3 days she has had inability to ambulate, and has tried to use a friend's walker during this time.   Past Medical History  Diagnosis Date  . Anxiety Dx 2007  . Hyperlipidemia Dx 2010  . Hypertension Dx 2010   Past Surgical History  Procedure Laterality Date  . Tibia fracture surgery      right  . Abdominal hysterectomy     Family History  Problem Relation Age of Onset  . Diabetes Mother   . Diabetes Sister    Social History  Substance Use Topics  . Smoking status: Light Tobacco Smoker -- 0.25 packs/day    Types: Cigarettes    Last Attempt to Quit: 07/18/2014  . Smokeless tobacco: Never Used     Comment: Smokes socially.  . Alcohol Use: No   OB History    No data available     Review of Systems 10/14 systems reviewed and are negative other than  those stated in the HPI    Allergies  Review of patient's allergies indicates no known allergies.  Home Medications   Prior to Admission medications   Medication Sig Start Date End Date Taking? Authorizing Provider  acetaminophen-codeine (TYLENOL #3) 300-30 MG tablet Take 1-2 tablets by mouth every 8 (eight) hours as needed for moderate pain. 09/28/15  Yes Josalyn Funches, MD  albuterol (PROVENTIL HFA;VENTOLIN HFA) 108 (90 BASE) MCG/ACT inhaler Inhale 2 puffs into the lungs every 6 (six) hours as needed for wheezing or shortness of breath. 12/18/14  Yes Micheline Chapman, NP  ALPRAZolam Duanne Moron) 0.5 MG tablet Take 1 tablet (0.5 mg total) by mouth at bedtime as needed for anxiety. 07/12/15  Yes Josalyn Funches, MD  amLODipine (NORVASC) 10 MG tablet Take 1 tablet (10 mg total) by mouth daily. 08/29/15  Yes Josalyn Funches, MD  ammonium lactate (LAC-HYDRIN) 12 % cream Apply topically as needed for dry skin. 05/30/15  Yes Josalyn Funches, MD  Biotin 1000 MCG tablet Take 2,000 mcg by mouth daily.   Yes Historical Provider, MD  cyclobenzaprine (FLEXERIL) 10 MG tablet Take 1 tablet (10 mg total) by mouth 2 (two) times daily. 08/29/15  Yes Josalyn Funches, MD  gabapentin (NEURONTIN) 300 MG capsule Take 1 capsule (300 mg total) by mouth at bedtime. 07/12/15  Yes Josalyn Funches, MD  HYDROcodone-acetaminophen (NORCO/VICODIN) 5-325 MG tablet Take 1 tablet by  mouth every 6 (six) hours as needed. 10/08/15  Yes Stevi Barrett, PA-C  L-LYSINE PO Take 1 tablet by mouth daily.   Yes Historical Provider, MD  metoprolol (LOPRESSOR) 50 MG tablet Take 1 tablet (50 mg total) by mouth 2 (two) times daily. 05/07/15  Yes Josalyn Funches, MD  vitamin C (ASCORBIC ACID) 500 MG tablet Take 500 mg by mouth daily.   Yes Historical Provider, MD  diclofenac (VOLTAREN) 75 MG EC tablet Take 1 tablet (75 mg total) by mouth 2 (two) times daily. Patient not taking: Reported on 10/08/2015 07/09/15   Boykin Nearing, MD  predniSONE  (DELTASONE) 50 MG tablet Take 1 tablet once a day for 5 days 10/08/15   Lahoma Crocker Barrett, PA-C  triamcinolone ointment (KENALOG) 0.5 % Apply 1 application topically 2 (two) times daily. Apply to hands 07/12/15   Josalyn Funches, MD   BP 127/85 mmHg  Pulse 69  Temp(Src) 98.3 F (36.8 C) (Oral)  Resp 16  Ht 5\' 4"  (1.626 m)  Wt 180 lb (81.647 kg)  BMI 30.88 kg/m2  SpO2 95% Physical Exam Physical Exam  Nursing note and vitals reviewed. Constitutional: Well developed, well nourished, non-toxic, and in no acute distress Head: Normocephalic and atraumatic.  Mouth/Throat: Oropharynx is clear and moist.  Neck: Normal range of motion. Neck supple.  Cardiovascular: Normal rate and regular rhythm.   Pulmonary/Chest: Effort normal and breath sounds normal.  Abdominal: Soft. There is no tenderness. There is no rebound and no guarding. No CVA tenderness. Musculoskeletal: no TLS spine tenderness, deformities or step offs. Neurological: Alert, no facial droop, fluent speech, reports diminished sensation in left lower extremity Mute achilles and patellar reflexes bilaterally, 4+/5 ankle dorsiflexion, knee extension/flexion, 5/5 ankle plantarflexion on the left lower extremity. Difficult to assess hip flexor/extensor on left side due to pain.  Full strength in hip, knee and ankle flexor/extensorsin the right lower extremity Skin: Skin is warm and dry.  Psychiatric: Cooperative  ED Course  Procedures (including critical care time) Labs Review Labs Reviewed  BASIC METABOLIC PANEL  CBC WITH DIFFERENTIAL/PLATELET    Imaging Review No results found. I have personally reviewed and evaluated these images and lab results as part of my medical decision-making.   EKG Interpretation None      MDM   Final diagnoses:  Left leg numbness    60 year old female who presents with progressively worsening low back pain radiating down the left leg. Appears comfortable at rest, but with reproducible pain with  straight leg raise and palpation of the left buttock. With diminished sensation down the left lower extremity, and some weakness in the and ankle flexors/extensors, although may also be limited due to pain. No bowel or urinary symptoms. No concern for spinal cord compression. Did not tolerate MRI with IM and PO narcotic medications as unable to lay flat. Will repeat MRI with IV narcotics and toradol. MRI pending and signed out to oncoming physician.     Forde Dandy, MD 10/18/15 [phone]

## 2015-10-18 NOTE — ED Notes (Signed)
Spoke with MRI stated will fit patient in for her MRI as soon as they can.  Notified patient.

## 2015-10-18 NOTE — Progress Notes (Signed)
Pt brought over for the third time to attempt MRI.  Pt still unable to lay down and keep still.  Pads were offered, positioning devices for her leg, nothing helped.  After several minutes she said she had to sit up.  ER called nurse Elmyra Ricks contacted physician and it was decided to hold off on giving more medications.  Pt sent back to the ER by stretcher with husband.

## 2015-10-18 NOTE — ED Provider Notes (Signed)
I assumed care at signout to f/u on MRI Pt unable to tolerate MRI after multiple doses of pain meds I advised admission for improved pain control and to have MRI We spoke at length about need for MRI, and may need to be sedated as inpatient for this study Consulted family medicine They saw patient Pt then abruptly requested d/c home She could ambulate Advised to call PCP Tomorrow  Ripley Fraise, MD 10/18/15 [phone]

## 2015-10-18 NOTE — Discharge Instructions (Signed)

## 2015-10-18 NOTE — ED Notes (Signed)
MRI called stated patient not able to tolerate MRI. Doctor notified.

## 2015-10-18 NOTE — ED Notes (Signed)
Onset since August 2016 sudden onset left buttocks pain radiating top left lower extremity. Since then having constant pain 10/10 sharp tingling numbness. Scheduled an MRI today and after 5 minutes could not tolerate MRI. MRI stopped and came to ED for evaluation.

## 2015-10-18 NOTE — Consult Note (Signed)
Family Medicine Teaching Service ED Consult Note Service Pager: [phone]  Patient name: Meredith Church Medical record number: [id number] Date of birth: Aug 03, 1956 Age: 60 y.o. Gender: female  Primary Care Provider: Minerva Ends, MD  Chief Complaint: Back pain  Assessment and Plan: Meredith Church is a 60 y.o. female presenting with severe back pain. PMH is significant for back pain with left sided sciatica  Back pain with sciatica: no emergent signs/symptoms. Patient appears able to manage her pain and is not amenable to admission. - recommend she pick up voltaren 75mg  BID prn from her pharmacy (prescribed by PCP) - recommend increase to gabapentin 300mg  TID prn - recommend follow-up with PCP and have PCP order MRI with sedation - return for worsening symptoms  Wheezing: no dyspnea. O2 wnl in ED - follow-up with PCP for management  Tobacco abuse - discussed cessation. She is trying. I hope she is able to continue  History of Present Illness:  Meredith Church is a 60 y.o. female presenting with to the emergency department with back pain. This is a chronic problem that began about 5-6 months ago. She reports no trauma or specific event that caused her pain. She reports that her pain starts in her lower left back and radiates down her buttock, thigh, calf and into her toes. She describes the pain as burning and almost like a pulling sensation. She has been seen for this multiple times by her PCP. She has received steroid injections, gabapentin and NSAIDs. She reports gabapentin helps but likely because she takes it at night when she is already going to sleep. She has not picked up the prescribed Voltaren, but states that Ibuprofen 200mg  6 times per day helped with her pain but she stopped secondary to abdominal discomfort. She has no chest pain, dyspnea, abdominal pain. She does report some polyuria. No urinary retention. No incontinence. No saddle anesthesia.  Review Of Systems: Per HPI  with the following additions:  Otherwise the remainder of the systems were negative.  Patient Active Problem List   Diagnosis Date Noted  . Positive D dimer 07/13/2015  . Xerosis of skin 05/30/2015  . Chronic radicular pain of lower back 05/07/2015  . 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 Sprain, strain 11/16/2014  . Smoker 07/27/2014  . Anxiety state 01/11/2014  . Essential hypertension, benign 01/11/2014    Past Medical History: Past Medical History  Diagnosis Date  . Anxiety Dx 2007  . Hyperlipidemia Dx 2010  . Hypertension Dx 2010    Past Surgical History: Past Surgical History  Procedure Laterality Date  . Tibia fracture surgery      right  . Abdominal hysterectomy      Social History: Social History  Substance Use Topics  . Smoking status: Light Tobacco Smoker -- 0.25 packs/day    Types: Cigarettes    Last Attempt to Quit: 07/18/2014  . Smokeless tobacco: Never Used     Comment: Smokes socially.  . Alcohol Use: No   Additional social history:   Please also refer to relevant sections of EMR.  Family History: Family History  Problem Relation Age of Onset  . Diabetes Mother   . Diabetes Sister     Allergies and Medications: No Known Allergies No current facility-administered medications on file prior to encounter.   Current Outpatient Prescriptions on File Prior to Encounter  Medication Sig Dispense Refill  . acetaminophen-codeine (TYLENOL #3) 300-30 MG tablet Take 1-2 tablets by mouth every 8 (eight) hours as needed  for moderate pain. 60 tablet 0  . albuterol (PROVENTIL HFA;VENTOLIN HFA) 108 (90 BASE) MCG/ACT inhaler Inhale 2 puffs into the lungs every 6 (six) hours as needed for wheezing or shortness of breath. 1 Inhaler 0  . ALPRAZolam (XANAX) 0.5 MG tablet Take 1 tablet (0.5 mg total) by mouth at bedtime as needed for anxiety. 30 tablet 2  . amLODipine (NORVASC) 10 MG tablet Take 1 tablet (10 mg total) by mouth daily. 30 tablet 5  . ammonium  lactate (LAC-HYDRIN) 12 % cream Apply topically as needed for dry skin. 385 g 0  . Biotin 1000 MCG tablet Take 2,000 mcg by mouth daily.    . cyclobenzaprine (FLEXERIL) 10 MG tablet Take 1 tablet (10 mg total) by mouth 2 (two) times daily. 20 tablet 0  . gabapentin (NEURONTIN) 300 MG capsule Take 1 capsule (300 mg total) by mouth at bedtime. 30 capsule 2  . HYDROcodone-acetaminophen (NORCO/VICODIN) 5-325 MG tablet Take 1 tablet by mouth every 6 (six) hours as needed. 8 tablet 0  . L-LYSINE PO Take 1 tablet by mouth daily.    . metoprolol (LOPRESSOR) 50 MG tablet Take 1 tablet (50 mg total) by mouth 2 (two) times daily. 60 tablet 5  . vitamin C (ASCORBIC ACID) 500 MG tablet Take 500 mg by mouth daily.    . diclofenac (VOLTAREN) 75 MG EC tablet Take 1 tablet (75 mg total) by mouth 2 (two) times daily. (Patient not taking: Reported on 10/08/2015) 30 tablet 0  . predniSONE (DELTASONE) 50 MG tablet Take 1 tablet once a day for 5 days 5 tablet 0  . triamcinolone ointment (KENALOG) 0.5 % Apply 1 application topically 2 (two) times daily. Apply to hands 30 g 2  . [DISCONTINUED] pravastatin (PRAVACHOL) 40 MG tablet Take 1 tablet (40 mg total) by mouth daily. 30 tablet 3    Objective: BP 165/98 mmHg  Pulse 75  Temp(Src) 98.3 F (36.8 C) (Oral)  Resp 16  Ht 5\' 4"  (1.626 m)  Wt 180 lb (81.647 kg)  BMI 30.88 kg/m2  SpO2 95% Exam: General: Well appearing, sitting in a chair. No distress Eyes: EOMI Cardiovascular: Normal rate with regular rhythm, no murmur Respiratory: mild wheezing in left lung field, normal unlabored breathing Abdomen: Non-distended MSK: No midline tenderness. Some tenderness over left SI joint Skin: No obvious rashes. Vertical scar over right knee Neuro: Alert, oriented. 4/5 strength bilaterally in upper and lower extremities. 2+ equal reflexes bilaterally Psych: Normal affect  Labs and Imaging: CBC BMET  No results for input(s): WBC, HGB, HCT, PLT in the last 168 hours. No  results for input(s): NA, K, CL, CO2, BUN, CREATININE, GLUCOSE, CALCIUM in the last 168 hours.    Mariel Aloe, MD 10/18/2015, 7:09 PM PGY-3, [facility] Intern pager: [phone], text pages welcome

## 2015-10-18 NOTE — ED Notes (Signed)
Doctor notified of MRI phone call.

## 2015-10-18 NOTE — ED Notes (Signed)
Patient refused medication wanted to speak with EDP.

## 2015-10-18 NOTE — ED Notes (Signed)
Doctor at bedside.

## 2015-10-18 NOTE — ED Notes (Signed)
MRI called patient unable to tolerate MRI will transport patient back to ED.

## 2015-10-18 NOTE — ED Notes (Signed)
Nurse went to MRI to administer pain medication. Patient resting comfortably on stretcher upright position however supine position pain 10/10 sharp left buttock radiating to left lower extremity.

## 2015-10-18 NOTE — ED Notes (Signed)
EDP at bedside  

## 2015-10-18 NOTE — ED Notes (Signed)
Spoke with MRI states will call when ready approximately one hour to administer medications. Patient verbalized understanding.

## 2015-10-19 ENCOUNTER — Telehealth: Payer: Self-pay | Admitting: Family Medicine

## 2015-10-19 DIAGNOSIS — G8929 Other chronic pain: Secondary | ICD-10-CM

## 2015-10-19 DIAGNOSIS — M5416 Radiculopathy, lumbar region: Principal | ICD-10-CM

## 2015-10-19 MED FILL — GABAPENTIN 300 MG CAPSULE: 300 | 30 days supply | Qty: 30 | Fill #2

## 2015-10-19 MED FILL — ?METOPROLOL 50 MG TABLET: 50 | 30 days supply | Qty: 60 | Fill #3

## 2015-10-19 MED FILL — ?AMLODIPINE BESYLATE 10 MG: 10 | 30 days supply | Qty: 30 | Fill #2

## 2015-10-19 NOTE — Telephone Encounter (Signed)
Patient called to inform PCP that she has been in the ED twice since last Friday when she was turned away by our office for showing up late to her appointment. I  reiterated that we did not turn her away, she was pass the 15 minute mark and PCP was informed and was unable to see her. Patient states that she has been calling the nurse line and no one returns her call, she never gets a a call back. Patient also stated that per ED visit her medications need to be adjusted and she needs and MRI... Appointment was scheduled for appointment on the 17th of this month, patient expressed concern of having to wait two week before seeing PCP.Marland KitchenMarland KitchenMarland KitchenMarland Kitchenplease follow up with patient

## 2015-10-23 NOTE — Telephone Encounter (Signed)
I have reviewed patient's ED note,  Lumbar MRI has been ordered by me, please schedule and call patient with appt details. She is advised to keep f/u appt as medication adjustments need to made in office.

## 2015-10-24 ENCOUNTER — Other Ambulatory Visit: Payer: Self-pay

## 2015-10-24 DIAGNOSIS — G8929 Other chronic pain: Secondary | ICD-10-CM

## 2015-10-24 DIAGNOSIS — M5416 Radiculopathy, lumbar region: Principal | ICD-10-CM

## 2015-10-25 ENCOUNTER — Other Ambulatory Visit: Payer: Self-pay | Admitting: Family Medicine

## 2015-10-25 DIAGNOSIS — G8929 Other chronic pain: Secondary | ICD-10-CM

## 2015-10-25 DIAGNOSIS — M5416 Radiculopathy, lumbar region: Principal | ICD-10-CM

## 2015-10-25 MED ORDER — LORAZEPAM 1 MG PO TABS: 1.0000 mg | ORAL_TABLET | Freq: Once | ORAL | Status: DC

## 2015-10-25 NOTE — Telephone Encounter (Signed)
MRI schedule. Pt requesting sedative, unable to due MRI in the past

## 2015-10-25 NOTE — Telephone Encounter (Signed)
Ativan 1 mg patient to take 1-2 mg before MRI ordered Please call pateint.

## 2015-10-26 MED ORDER — ACETAMINOPHEN-CODEINE #3 300-30 MG PO TABS: 1.0000 | ORAL_TABLET | Freq: Three times a day (TID) | ORAL | Status: DC | PRN

## 2015-10-26 NOTE — Telephone Encounter (Signed)
Pt notified Rx at front office ready to be pick up MRI appointment information given

## 2015-11-01 ENCOUNTER — Other Ambulatory Visit: Payer: Self-pay | Admitting: Family Medicine

## 2015-11-01 ENCOUNTER — Ambulatory Visit (HOSPITAL_COMMUNITY)
Admission: RE | Admit: 2015-11-01 | Discharge: 2015-11-01 | Disposition: A | Discharge status: Home / Self Care | Payer: Self-pay | Source: Ambulatory Visit | Attending: Family Medicine | Admitting: Family Medicine

## 2015-11-01 ENCOUNTER — Telehealth: Payer: Self-pay | Admitting: *Deleted

## 2015-11-01 DIAGNOSIS — M5116 Intervertebral disc disorders with radiculopathy, lumbar region: Secondary | ICD-10-CM | POA: Insufficient documentation

## 2015-11-01 DIAGNOSIS — M4806 Spinal stenosis, lumbar region: Secondary | ICD-10-CM | POA: Insufficient documentation

## 2015-11-01 DIAGNOSIS — G8929 Other chronic pain: Secondary | ICD-10-CM

## 2015-11-01 DIAGNOSIS — M5416 Radiculopathy, lumbar region: Secondary | ICD-10-CM

## 2015-11-01 DIAGNOSIS — M5410 Radiculopathy, site unspecified: Secondary | ICD-10-CM | POA: Insufficient documentation

## 2015-11-01 DIAGNOSIS — M545 Low back pain: Secondary | ICD-10-CM | POA: Insufficient documentation

## 2015-11-01 NOTE — Telephone Encounter (Signed)
LVM to return call.

## 2015-11-01 NOTE — Telephone Encounter (Signed)
-----   Message from Boykin Nearing, MD sent at 11/01/2015  8:30 AM EST ----- Patient's MRI reveals bulging disc onto left L5 nerve This is most likely source of pain Neurosurgery referral placed

## 2015-11-02 ENCOUNTER — Ambulatory Visit: Payer: Self-pay | Attending: Family Medicine | Admitting: Family Medicine

## 2015-11-02 ENCOUNTER — Encounter: Payer: Self-pay | Admitting: Family Medicine

## 2015-11-02 VITALS — BP 108/78 | HR 111 | Temp 98.9°F | Resp 16 | Ht 64.0 in | Wt 184.0 lb

## 2015-11-02 DIAGNOSIS — Z79899 Other long term (current) drug therapy: Secondary | ICD-10-CM | POA: Insufficient documentation

## 2015-11-02 DIAGNOSIS — M5432 Sciatica, left side: Secondary | ICD-10-CM

## 2015-11-02 DIAGNOSIS — L309 Dermatitis, unspecified: Secondary | ICD-10-CM | POA: Insufficient documentation

## 2015-11-02 DIAGNOSIS — L301 Dyshidrosis [pompholyx]: Secondary | ICD-10-CM

## 2015-11-02 DIAGNOSIS — M5116 Intervertebral disc disorders with radiculopathy, lumbar region: Secondary | ICD-10-CM | POA: Insufficient documentation

## 2015-11-02 DIAGNOSIS — M5416 Radiculopathy, lumbar region: Secondary | ICD-10-CM

## 2015-11-02 DIAGNOSIS — G8929 Other chronic pain: Secondary | ICD-10-CM

## 2015-11-02 DIAGNOSIS — M5410 Radiculopathy, site unspecified: Secondary | ICD-10-CM

## 2015-11-02 DIAGNOSIS — F17210 Nicotine dependence, cigarettes, uncomplicated: Secondary | ICD-10-CM | POA: Insufficient documentation

## 2015-11-02 MED ORDER — DIAZEPAM 5 MG PO TABS: 5.0000 mg | ORAL_TABLET | Freq: Two times a day (BID) | ORAL | Status: DC | PRN

## 2015-11-02 MED ORDER — CANE MISC: 1.0000 | Freq: Every day | Status: AC

## 2015-11-02 MED ORDER — TRIAMCINOLONE ACETONIDE 0.5 % EX OINT: 1.0000 "application " | TOPICAL_OINTMENT | Freq: Two times a day (BID) | CUTANEOUS | Status: DC

## 2015-11-02 NOTE — Patient Instructions (Addendum)
Meredith Church was seen today for back pain.  Diagnoses and all orders for this visit:  Chronic radicular pain of lower back -     diazepam (VALIUM) 5 MG tablet; Take 1 tablet (5 mg total) by mouth every 12 (twelve) hours as needed for muscle spasms.  Dyshidrotic hand dermatitis -     triamcinolone ointment (KENALOG) 0.5 %; Apply 1 application topically 2 (two) times daily. Apply to hands   Please replace xanax with valium, first at night, then twice daily for anxiety and muscle spasms You will be called with neurosurgery referral information.  I recommend that you obtain a cane to use in R hand to support the L side, move the L leg and cane together so each side shares the load. To climb stairs, step with your R leg, then the L leg and the cane.   Sign up for mychart to send and receive communications  F/u in 4 weeks for low back pain with sciatica due to herniated disk  Dr. Adrian Blackwater

## 2015-11-02 NOTE — Progress Notes (Signed)
Subjective:  Patient ID: Meredith Church, female    DOB: 06-Sep-1956  Age: 60 y.o. MRN: [id number]  CC: Back Pain   HPI [name] presents for   1. Chronic low back pain: acutely worsened over last 2 months. She has received muscle relaxer and prednisone course. She continues on flexeril. She hadlumbar MRI done yesterday that revealed herniated disc. She has been referred to neurosurgery. She complains of pain and difficulty walking. She is taking tylenol #3, gabapentin 300 mg q HS and xanax 0.5 mg q HS. She has pain in bilateral gluteal area. She has numbness that goes down her L gluteal to L lateral leg. No falls. She uses a folding walker at times. She does not have a cane. She has been working with a Restaurant manager, fast food.   Social History  Substance Use Topics  . Smoking status: Light Tobacco Smoker -- 0.25 packs/day    Types: Cigarettes    Last Attempt to Quit: 07/18/2014  . Smokeless tobacco: Never Used     Comment: Smokes socially.  . Alcohol Use: No    Outpatient Prescriptions Prior to Visit  Medication Sig Dispense Refill  . acetaminophen-codeine (TYLENOL #3) 300-30 MG tablet Take 1-2 tablets by mouth every 8 (eight) hours as needed for moderate pain. 60 tablet 0  . albuterol (PROVENTIL HFA;VENTOLIN HFA) 108 (90 BASE) MCG/ACT inhaler Inhale 2 puffs into the lungs every 6 (six) hours as needed for wheezing or shortness of breath. 1 Inhaler 0  . ALPRAZolam (XANAX) 0.5 MG tablet Take 1 tablet (0.5 mg total) by mouth at bedtime as needed for anxiety. 30 tablet 2  . amLODipine (NORVASC) 10 MG tablet Take 1 tablet (10 mg total) by mouth daily. 30 tablet 5  . ammonium lactate (LAC-HYDRIN) 12 % cream Apply topically as needed for dry skin. 385 g 0  . Biotin 1000 MCG tablet Take 2,000 mcg by mouth daily.    . cyclobenzaprine (FLEXERIL) 10 MG tablet Take 1 tablet (10 mg total) by mouth 2 (two) times daily. 20 tablet 0  . diclofenac (VOLTAREN) 75 MG EC tablet Take 1 tablet (75 mg total) by  mouth 2 (two) times daily. 30 tablet 0  . gabapentin (NEURONTIN) 300 MG capsule Take 1 capsule (300 mg total) by mouth at bedtime. 30 capsule 2  . L-LYSINE PO Take 1 tablet by mouth daily.    Marland Kitchen LORazepam (ATIVAN) 1 MG tablet Take 1-2 tablets (1-2 mg total) by mouth once. 30 minutes before lumbar MRI 2 tablet 0  . metoprolol (LOPRESSOR) 50 MG tablet Take 1 tablet (50 mg total) by mouth 2 (two) times daily. 60 tablet 5  . predniSONE (DELTASONE) 50 MG tablet Take 1 tablet once a day for 5 days 5 tablet 0  . triamcinolone ointment (KENALOG) 0.5 % Apply 1 application topically 2 (two) times daily. Apply to hands 30 g 2  . vitamin C (ASCORBIC ACID) 500 MG tablet Take 500 mg by mouth daily.    Marland Kitchen HYDROcodone-acetaminophen (NORCO/VICODIN) 5-325 MG tablet Take 1 tablet by mouth every 6 (six) hours as needed. (Patient not taking: Reported on 11/02/2015) 8 tablet 0   No facility-administered medications prior to visit.    ROS Review of Systems  Constitutional: Negative for fever and chills.  Eyes: Negative for visual disturbance.  Respiratory: Negative for shortness of breath.   Cardiovascular: Negative for chest pain.  Gastrointestinal: Negative for abdominal pain and blood in stool.  Musculoskeletal: Positive for back pain and gait problem.  Negative for arthralgias.  Skin: Negative for rash.  Allergic/Immunologic: Negative for immunocompromised state.  Neurological: Positive for numbness (L lateral leg ).  Hematological: Negative for adenopathy. Does not bruise/bleed easily.  Psychiatric/Behavioral: Negative for suicidal ideas and dysphoric mood.    Objective:  BP 108/78 mmHg  Pulse 111  Temp(Src) 98.9 F (37.2 C) (Oral)  Resp 16  Ht 5\' 4"  (1.626 m)  Wt 184 lb (83.462 kg)  BMI 31.57 kg/m2  SpO2 96%  BP/Weight 11/02/2015 10/18/2015 [id number]  Systolic BP 123XX123 A999333 A999333  Diastolic BP 78 123XX123 81  Wt. (Lbs) 184 180 -  BMI 31.57 30.88 -   Pulse Readings from Last 3 Encounters:  11/02/15 111    10/18/15 75  10/08/15 80    Physical Exam  Constitutional: She is oriented to person, place, and time. She appears well-developed and well-nourished. No distress.  HENT:  Head: Normocephalic and atraumatic.  Cardiovascular: Normal rate, regular rhythm, normal heart sounds and intact distal pulses.   Pulmonary/Chest: Effort normal and breath sounds normal.  Musculoskeletal: She exhibits no edema.       Lumbar back: She exhibits decreased range of motion, tenderness, pain and spasm. She exhibits no bony tenderness, no swelling, no edema, no deformity and no laceration.  Neurological: She is alert and oriented to person, place, and time. A sensory deficit is present. Gait abnormal.  Skin: Skin is warm and dry. No rash noted.     Psychiatric: She has a normal mood and affect.    Assessment & Plan:  Romiyah was seen today for back pain.  Diagnoses and all orders for this visit:  Chronic radicular pain of lower back -     diazepam (VALIUM) 5 MG tablet; Take 1 tablet (5 mg total) by mouth every 12 (twelve) hours as needed for muscle spasms. -     Misc. Devices (CANE) MISC; 1 each by Does not apply route daily.  Dyshidrotic hand dermatitis -     triamcinolone ointment (KENALOG) 0.5 %; Apply 1 application topically 2 (two) times daily. Apply to hands   Follow-up: No Follow-up on file.   Boykin Nearing MD

## 2015-11-02 NOTE — Progress Notes (Signed)
F/U MRI results  Pain on leg and back  Pain scale # 10 No tobacco user No suicidal thoughts in the past two weeks

## 2015-11-02 NOTE — Assessment & Plan Note (Signed)
A: lumbar back pain with sciatica due to herniated disk P: Neurosurgery referral placed Work letter Add valium 5 mg q Hs, then BID Continue tylenol #3 rx for cane

## 2015-11-02 NOTE — Telephone Encounter (Signed)
Results given at OV 

## 2015-11-09 ENCOUNTER — Encounter: Payer: Self-pay | Admitting: Clinical

## 2015-11-09 NOTE — Progress Notes (Signed)
Depression screen [facility] 2/9 11/02/2015 05/07/2015 01/17/2015 11/16/2014 09/28/2014  Decreased Interest 2 0 0 0 3  Down, Depressed, Hopeless 2 0 0 0 1  PHQ - 2 Score 4 0 0 0 4  Altered sleeping 1 - - - 3  Tired, decreased energy 2 - - - 1  Change in appetite 2 - - - 3  Feeling bad or failure about yourself  1 - - - 0  Trouble concentrating 1 - - - 1  Moving slowly or fidgety/restless 0 - - - 1  Suicidal thoughts 0 - - - 0  PHQ-9 Score 11 - - - 13    GAD 7 : Generalized Anxiety Score 11/02/2015  Nervous, Anxious, on Edge 3  Control/stop worrying 3  Worry too much - different things 3  Trouble relaxing 3  Restless 1  Easily annoyed or irritable 2  Afraid - awful might happen 3  Total GAD 7 Score 18

## 2015-11-21 MED FILL — ?AMLODIPINE BESYLATE 10 MG: 10 | 30 days supply | Qty: 30 | Fill #3

## 2015-11-21 MED FILL — ?METOPROLOL 50 MG TABLET: 50 | 30 days supply | Qty: 60 | Fill #4

## 2015-11-26 ENCOUNTER — Telehealth: Payer: Self-pay | Admitting: Family Medicine

## 2015-11-26 NOTE — Telephone Encounter (Signed)
Patient called requesting to speak to nurse ,regarding MRI results  please f/u

## 2015-11-26 NOTE — Telephone Encounter (Signed)
Patient called requesting to speak to nurse ,regarding MRI results please f/u

## 2015-11-27 NOTE — Telephone Encounter (Signed)
Returned pt call  Per female wrong number

## 2015-12-07 ENCOUNTER — Other Ambulatory Visit: Payer: Self-pay | Admitting: Family Medicine

## 2015-12-07 MED FILL — GABAPENTIN 300 MG CAPSULE: 300 | 30 days supply | Qty: 30 | Fill #0

## 2015-12-10 ENCOUNTER — Telehealth: Payer: Self-pay

## 2015-12-10 DIAGNOSIS — G8929 Other chronic pain: Secondary | ICD-10-CM

## 2015-12-10 DIAGNOSIS — M5416 Radiculopathy, lumbar region: Principal | ICD-10-CM

## 2015-12-10 MED ORDER — DIAZEPAM 5 MG PO TABS: 5.0000 mg | ORAL_TABLET | Freq: Two times a day (BID) | ORAL | Status: DC | PRN

## 2015-12-10 MED ORDER — ACETAMINOPHEN-CODEINE #3 300-30 MG PO TABS: 1.0000 | ORAL_TABLET | Freq: Three times a day (TID) | ORAL | Status: DC | PRN

## 2015-12-10 NOTE — Addendum Note (Signed)
Addended by: Boykin Nearing on: 12/10/2015 12:48 PM   Modules accepted: Orders

## 2015-12-10 NOTE — Telephone Encounter (Signed)
Returned patient phone call Message was left on voice mail Patient is requesting a referral to pain management As well as refills on her gabapentin, valium and tylenol #3

## 2015-12-10 NOTE — Telephone Encounter (Signed)
Med refilled  Please inform patient for pick up Pain management referral placed

## 2015-12-18 ENCOUNTER — Telehealth: Payer: Self-pay | Admitting: Family Medicine

## 2015-12-18 NOTE — Telephone Encounter (Signed)
Patient came in requesting a note for work. Note is needed by 4/5.  Patient missed work from March 7th to April 3rd due to pain in sciatic nerve. Patient had mentioned that she was sent home from work.   Please follow up.

## 2015-12-20 ENCOUNTER — Ambulatory Visit: Payer: Self-pay | Attending: Family Medicine | Admitting: Family Medicine

## 2015-12-20 ENCOUNTER — Encounter: Payer: Self-pay | Admitting: Family Medicine

## 2015-12-20 VITALS — BP 125/76 | HR 84 | Temp 97.9°F | Resp 16 | Ht 64.0 in | Wt 183.0 lb

## 2015-12-20 DIAGNOSIS — Z1231 Encounter for screening mammogram for malignant neoplasm of breast: Secondary | ICD-10-CM

## 2015-12-20 DIAGNOSIS — M5410 Radiculopathy, site unspecified: Secondary | ICD-10-CM

## 2015-12-20 DIAGNOSIS — M5416 Radiculopathy, lumbar region: Secondary | ICD-10-CM

## 2015-12-20 DIAGNOSIS — M5432 Sciatica, left side: Secondary | ICD-10-CM | POA: Insufficient documentation

## 2015-12-20 DIAGNOSIS — F17210 Nicotine dependence, cigarettes, uncomplicated: Secondary | ICD-10-CM | POA: Insufficient documentation

## 2015-12-20 DIAGNOSIS — M5126 Other intervertebral disc displacement, lumbar region: Secondary | ICD-10-CM | POA: Insufficient documentation

## 2015-12-20 DIAGNOSIS — Z79899 Other long term (current) drug therapy: Secondary | ICD-10-CM | POA: Insufficient documentation

## 2015-12-20 DIAGNOSIS — M5116 Intervertebral disc disorders with radiculopathy, lumbar region: Secondary | ICD-10-CM

## 2015-12-20 DIAGNOSIS — G8929 Other chronic pain: Secondary | ICD-10-CM | POA: Insufficient documentation

## 2015-12-20 MED ORDER — DIAZEPAM 5 MG PO TABS: 5.0000 mg | ORAL_TABLET | Freq: Two times a day (BID) | ORAL | Status: DC | PRN

## 2015-12-20 MED ORDER — METHYLPREDNISOLONE ACETATE 80 MG/ML IJ SUSP
80.0000 mg | Freq: Once | INTRAMUSCULAR | Status: AC
  Administered 2015-12-20: 80 mg via INTRAMUSCULAR

## 2015-12-20 MED FILL — ?AMLODIPINE BESYLATE 10 MG: 10 | 30 days supply | Qty: 30 | Fill #4

## 2015-12-20 NOTE — Assessment & Plan Note (Signed)
A: patient with lumbar disc disease and radiculopathy, slight improvement P: 80 mg x one depo medrol Refilled valium Work note written

## 2015-12-20 NOTE — Progress Notes (Signed)
Sciatic pain x 1 week  Pain scale # 9 Tobacco user 5-10 per day  No suicidal thoughts

## 2015-12-20 NOTE — Progress Notes (Signed)
Subjective:  Patient ID: Meredith Church, female    DOB: August 02, 1956  Age: 60 y.o. MRN: [id number]  CC: Follow-up   HPI KAPRI HURLESS presents for   1. Chronic low back pain: she reports slight improvement overall. She still has b/l low back and gluteal pain that is sometimes worse on the L and sometimes the R. Today symptoms are worse on the R. She denies fecal incontinence. She uses a walker at home as needed. She denies history of falls. She uses a standing desk at work. She requests refill of diazepam which is states helps with her back pain. She was at work note as she was out of work from 12/10/2015 to today. She reports that she went to chart her chiropractor. She is uninsured and unable to complete the referral to pain management are to surgery.   Social History  Substance Use Topics  . Smoking status: Light Tobacco Smoker -- 0.25 packs/day    Types: Cigarettes    Last Attempt to Quit: 07/18/2014  . Smokeless tobacco: Never Used     Comment: Smokes socially.  . Alcohol Use: No    Outpatient Prescriptions Prior to Visit  Medication Sig Dispense Refill  . acetaminophen-codeine (TYLENOL #3) 300-30 MG tablet Take 1-2 tablets by mouth every 8 (eight) hours as needed for moderate pain. 60 tablet 0  . albuterol (PROVENTIL HFA;VENTOLIN HFA) 108 (90 BASE) MCG/ACT inhaler Inhale 2 puffs into the lungs every 6 (six) hours as needed for wheezing or shortness of breath. 1 Inhaler 0  . amLODipine (NORVASC) 10 MG tablet Take 1 tablet (10 mg total) by mouth daily. 30 tablet 5  . ammonium lactate (LAC-HYDRIN) 12 % cream Apply topically as needed for dry skin. 385 g 0  . Biotin 1000 MCG tablet Take 2,000 mcg by mouth daily.    . cyclobenzaprine (FLEXERIL) 10 MG tablet Take 1 tablet (10 mg total) by mouth 2 (two) times daily. 20 tablet 0  . diclofenac (VOLTAREN) 75 MG EC tablet Take 1 tablet (75 mg total) by mouth 2 (two) times daily. 30 tablet 0  . gabapentin (NEURONTIN) 300 MG capsule TAKE  ONE CAPSULE BY MOUTH AT BEDTIME 30 capsule 2  . L-LYSINE PO Take 1 tablet by mouth daily.    . metoprolol (LOPRESSOR) 50 MG tablet Take 1 tablet (50 mg total) by mouth 2 (two) times daily. 60 tablet 5  . triamcinolone ointment (KENALOG) 0.5 % Apply 1 application topically 2 (two) times daily. Apply to hands 30 g 2  . vitamin C (ASCORBIC ACID) 500 MG tablet Take 500 mg by mouth daily.    . diazepam (VALIUM) 5 MG tablet Take 1 tablet (5 mg total) by mouth every 12 (twelve) hours as needed for muscle spasms. 60 tablet 0  . Misc. Devices (CANE) MISC 1 each by Does not apply route daily. (Patient not taking: Reported on 12/20/2015) 1 each 0   No facility-administered medications prior to visit.    ROS Review of Systems  Constitutional: Negative for fever and chills.  Eyes: Negative for visual disturbance.  Respiratory: Negative for shortness of breath.   Cardiovascular: Negative for chest pain.  Gastrointestinal: Negative for abdominal pain and blood in stool.  Musculoskeletal: Positive for back pain and gait problem. Negative for arthralgias.  Skin: Negative for rash.  Allergic/Immunologic: Negative for immunocompromised state.  Neurological: Positive for numbness (L lateral leg ).  Hematological: Negative for adenopathy. Does not bruise/bleed easily.  Psychiatric/Behavioral: Negative for suicidal ideas  and dysphoric mood.    Objective:  BP 125/76 mmHg  Pulse 84  Temp(Src) 97.9 F (36.6 C) (Oral)  Resp 16  Ht 5\' 4"  (1.626 m)  Wt 183 lb (83.008 kg)  BMI 31.40 kg/m2  SpO2 96%  BP/Weight 12/20/2015 [id number]  Systolic BP 0000000 [id number]  Diastolic BP 76 78 123XX123  Wt. (Lbs) 183 184 180  BMI 31.4 31.57 30.88   Physical Exam  Constitutional: She is oriented to person, place, and time. She appears well-developed and well-nourished. No distress.  HENT:  Head: Normocephalic and atraumatic.  Cardiovascular: Normal rate, regular rhythm, normal heart sounds and intact distal pulses.     Pulmonary/Chest: Effort normal and breath sounds normal.  Musculoskeletal: She exhibits no edema.       Right shoulder: She exhibits decreased range of motion and tenderness.       Lumbar back: She exhibits decreased range of motion, tenderness, pain and spasm. She exhibits no bony tenderness, no swelling, no edema, no deformity and no laceration.  Neurological: She is alert and oriented to person, place, and time. Gait abnormal.  Skin: Skin is warm and dry. No rash noted.     Psychiatric: She has a normal mood and affect.   Patient treated  80 mg IM depo medrol 1  Assessment & Plan:   Aaron was seen today for follow-up.  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  Chronic radicular pain of lower back -     diazepam (VALIUM) 5 MG tablet; Take 1 tablet (5 mg total) by mouth every 12 (twelve) hours as needed for muscle spasms.    Meds ordered this encounter  Medications  . methylPREDNISolone acetate (DEPO-MEDROL) injection 80 mg    Sig:   . diazepam (VALIUM) 5 MG tablet    Sig: Take 1 tablet (5 mg total) by mouth every 12 (twelve) hours as needed for muscle spasms.    Dispense:  60 tablet    Refill:  0    Follow-up: No Follow-up on file.   Boykin Nearing MD

## 2015-12-20 NOTE — Telephone Encounter (Signed)
Patient seen in office today Letter written

## 2015-12-20 NOTE — Patient Instructions (Addendum)
Meredith Church was seen today for follow-up.  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  Chronic radicular pain of lower back -     diazepam (VALIUM) 5 MG tablet; Take 1 tablet (5 mg total) by mouth every 12 (twelve) hours as needed for muscle spasms.  Visit for screening mammogram -     MM DIGITAL SCREENING BILATERAL; Future   F/u in 3 months for chronic low back pain   Dr. Adrian Blackwater

## 2016-01-01 ENCOUNTER — Ambulatory Visit: Payer: Self-pay | Admitting: Family Medicine

## 2016-01-18 MED FILL — AMLODIPINE BESYLATE 10 MG T: 10 | 30 days supply | Qty: 30 | Fill #0

## 2016-01-18 MED FILL — ?METOPROLOL 50 MG TABLET: 50 | 30 days supply | Qty: 60 | Fill #5

## 2016-02-07 ENCOUNTER — Encounter: Payer: Medicaid Other | Attending: Physical Medicine & Rehabilitation | Admitting: Physical Medicine & Rehabilitation

## 2016-02-07 ENCOUNTER — Encounter: Payer: Self-pay | Admitting: Physical Medicine & Rehabilitation

## 2016-02-07 ENCOUNTER — Telehealth: Payer: Self-pay | Admitting: Family Medicine

## 2016-02-07 VITALS — BP 107/77 | HR 89 | Resp 16

## 2016-02-07 DIAGNOSIS — R200 Anesthesia of skin: Secondary | ICD-10-CM | POA: Insufficient documentation

## 2016-02-07 DIAGNOSIS — G4700 Insomnia, unspecified: Secondary | ICD-10-CM

## 2016-02-07 DIAGNOSIS — M5416 Radiculopathy, lumbar region: Principal | ICD-10-CM

## 2016-02-07 DIAGNOSIS — F411 Generalized anxiety disorder: Secondary | ICD-10-CM

## 2016-02-07 DIAGNOSIS — G894 Chronic pain syndrome: Secondary | ICD-10-CM

## 2016-02-07 DIAGNOSIS — G8929 Other chronic pain: Secondary | ICD-10-CM

## 2016-02-07 DIAGNOSIS — Z5181 Encounter for therapeutic drug level monitoring: Secondary | ICD-10-CM

## 2016-02-07 DIAGNOSIS — R269 Unspecified abnormalities of gait and mobility: Secondary | ICD-10-CM | POA: Diagnosis not present

## 2016-02-07 DIAGNOSIS — F17210 Nicotine dependence, cigarettes, uncomplicated: Secondary | ICD-10-CM | POA: Insufficient documentation

## 2016-02-07 DIAGNOSIS — Z79899 Other long term (current) drug therapy: Secondary | ICD-10-CM

## 2016-02-07 DIAGNOSIS — E785 Hyperlipidemia, unspecified: Secondary | ICD-10-CM | POA: Insufficient documentation

## 2016-02-07 DIAGNOSIS — Z90710 Acquired absence of both cervix and uterus: Secondary | ICD-10-CM | POA: Insufficient documentation

## 2016-02-07 DIAGNOSIS — I10 Essential (primary) hypertension: Secondary | ICD-10-CM | POA: Insufficient documentation

## 2016-02-07 DIAGNOSIS — R61 Generalized hyperhidrosis: Secondary | ICD-10-CM | POA: Insufficient documentation

## 2016-02-07 DIAGNOSIS — M5441 Lumbago with sciatica, right side: Secondary | ICD-10-CM | POA: Insufficient documentation

## 2016-02-07 DIAGNOSIS — M5442 Lumbago with sciatica, left side: Secondary | ICD-10-CM | POA: Diagnosis not present

## 2016-02-07 DIAGNOSIS — R531 Weakness: Secondary | ICD-10-CM | POA: Insufficient documentation

## 2016-02-07 DIAGNOSIS — F0390 Unspecified dementia without behavioral disturbance: Secondary | ICD-10-CM

## 2016-02-07 MED ORDER — MELOXICAM 7.5 MG PO TABS: 7.5000 mg | ORAL_TABLET | Freq: Every day | ORAL | Status: DC

## 2016-02-07 MED ORDER — AMITRIPTYLINE HCL 150 MG PO TABS
75.0000 mg | ORAL_TABLET | Freq: Every day | ORAL | Status: DC
Start: 2016-02-07 — End: 2016-02-14

## 2016-02-07 MED ORDER — GABAPENTIN 300 MG PO CAPS: 300.0000 mg | ORAL_CAPSULE | Freq: Three times a day (TID) | ORAL | Status: DC

## 2016-02-07 MED FILL — GABAPENTIN 300 MG CAPSULE: 300 | 30 days supply | Qty: 90 | Fill #0

## 2016-02-07 MED FILL — MELOXICAM 7.5 MG TABLET: 7.5 | 30 days supply | Qty: 30 | Fill #0

## 2016-02-07 MED FILL — AMITRIPTYLINE HCL 75 MG TAB: 75 | 30 days supply | Qty: 30 | Fill #0

## 2016-02-07 NOTE — Addendum Note (Signed)
Addended by: Geryl Rankins D on: 02/07/2016 03:01 PM   Modules accepted: Orders

## 2016-02-07 NOTE — Progress Notes (Signed)
Subjective:    Patient ID: Meredith Church, female    DOB: Sep 19, 1955, 60 y.o.   MRN: [id number]  HPI  60 y/o pmh of anxiety presents right lower back pain.  This started Aug 2016 after she was sitting in a chair for a prolonged period.  Getting progressively worse.  No alleviating factors.  Sitting, walking exacerbate the pain.  Shocking pain.  Intermittent.  Radiates to right ankle, left radiates to tow.  Today, 9/10.   She has tried Tylenol #3, Valium, some other medications.  Percocet works for her.  She has associated numbness. She had and MRI, which shows left L5 impingement.  She also has associated weakness. She denies falls. She is not able to work, but used to do sedentary work.    Pain Inventory Average Pain 10 Pain Right Now 9 My pain is constant, sharp, burning, stabbing, tingling and aching  In the last 24 hours, has pain interfered with the following? General activity 10 Relation with others 9 Enjoyment of life 10 What TIME of day is your pain at its worst? NA Sleep (in general) Poor  Pain is worse with: walking, bending, sitting and standing Pain improves with: rest and heat/ice Relief from Meds: NA  Mobility walk with assistance use a walker how many minutes can you walk? 4-5 ability to climb steps?  yes do you drive?  yes Do you have any goals in this area?  yes  Function not employed: date last employed 11/14/2015 retired I need assistance with the following:  dressing, household duties and shopping Do you have any goals in this area?  yes  Neuro/Psych weakness numbness tingling trouble walking spasms anxiety  Prior Studies Any changes since last visit?  no  Physicians involved in your care Any changes since last visit?  no   Family History  Problem Relation Age of Onset  . Diabetes Mother   . Diabetes Sister    Social History   Social History  . Marital Status: Single    Spouse Name: N/A  . Number of Children: N/A  . Years of  Education: N/A   Social History Main Topics  . Smoking status: Current Every Day Smoker -- 0.50 packs/day    Types: Cigarettes    Last Attempt to Quit: 07/18/2014  . Smokeless tobacco: Never Used     Comment: Smokes socially.  . Alcohol Use: No  . Drug Use: No  . Sexual Activity: Not Asked   Other Topics Concern  . None   Social History Narrative   Past Surgical History  Procedure Laterality Date  . Tibia fracture surgery      right  . Abdominal hysterectomy    . Lipoma excision    . Salivary gland surgery     Past Medical History  Diagnosis Date  . Anxiety Dx 2007  . Hyperlipidemia Dx 2010  . Hypertension Dx 2010   BP 107/77 mmHg  Pulse 89  Resp 16  SpO2 97%  Opioid Risk Score:   Fall Risk Score:  `1  Depression screen PHQ 2/9  Depression screen [place] [facility] 2/9 11/02/2015 05/07/2015 01/17/2015 11/16/2014 09/28/2014 09/28/2014 07/06/2014  Decreased Interest 2 0 0 0 3 0 0  Down, Depressed, Hopeless 2 0 0 0 1 0 0  PHQ - 2 Score 4 0 0 0 4 0 0  Altered sleeping 1 - - - 3 - -  Tired, decreased energy 2 - - - 1 - -  Change in appetite 2 - - -  3 - -  Feeling bad or failure about yourself  1 - - - 0 - -  Trouble concentrating 1 - - - 1 - -  Moving slowly or fidgety/restless 0 - - - 1 - -  Suicidal thoughts 0 - - - 0 - -  PHQ-9 Score 11 - - - 13 - -   Current Outpatient Prescriptions on File Prior to Visit  Medication Sig Dispense Refill  . albuterol (PROVENTIL HFA;VENTOLIN HFA) 108 (90 BASE) MCG/ACT inhaler Inhale 2 puffs into the lungs every 6 (six) hours as needed for wheezing or shortness of breath. 1 Inhaler 0  . amLODipine (NORVASC) 10 MG tablet Take 1 tablet (10 mg total) by mouth daily. 30 tablet 5  . ammonium lactate (LAC-HYDRIN) 12 % cream Apply topically as needed for dry skin. 385 g 0  . Biotin 1000 MCG tablet Take 2,000 mcg by mouth daily.    . diazepam (VALIUM) 5 MG tablet Take 1 tablet (5 mg total) by mouth every 12 (twelve) hours as needed for muscle spasms. 60  tablet 0  . diclofenac (VOLTAREN) 75 MG EC tablet Take 1 tablet (75 mg total) by mouth 2 (two) times daily. 30 tablet 0  . gabapentin (NEURONTIN) 300 MG capsule TAKE ONE CAPSULE BY MOUTH AT BEDTIME 30 capsule 2  . L-LYSINE PO Take 1 tablet by mouth daily.    . metoprolol (LOPRESSOR) 50 MG tablet Take 1 tablet (50 mg total) by mouth 2 (two) times daily. 60 tablet 5  . Misc. Devices (CANE) MISC 1 each by Does not apply route daily. 1 each 0  . triamcinolone ointment (KENALOG) 0.5 % Apply 1 application topically 2 (two) times daily. Apply to hands 30 g 2  . vitamin C (ASCORBIC ACID) 500 MG tablet Take 500 mg by mouth daily.    . [DISCONTINUED] pravastatin (PRAVACHOL) 40 MG tablet Take 1 tablet (40 mg total) by mouth daily. 30 tablet 3   No current facility-administered medications on file prior to visit.     Review of Systems  Constitutional: Positive for diaphoresis.  Respiratory: Positive for wheezing.   Neurological: Positive for weakness and numbness.       Tingling Gait Instability Spasms  Psychiatric/Behavioral: The patient is nervous/anxious.   All other systems reviewed and are negative.     Objective:   Physical Exam Gen: NAD. Vital signs reviewed HENT: Normocephalic, Atraumatic Eyes: EOMI, Conj WNL Cardio: S1, S2 normal, RRR Pulm: B/l clear to auscultation.  Effort normal Abd: Soft, non-distended, non-tender, BS+ MSK:  Gait antalgic.   TTP diffusely along b/l lower lumbar PSPs and gluteal area.    No edema.   ROM limited in b/l LE due to pain Neuro: CN II-XII grossly intact.    Sensation intact to light touch in all LE dermatomes, except for left L5 dermatome  Reflexes 2+ throughout  Strength  4/5 b/l hip flexion, 4+/5 b/l knee extension, ankle dorsi/plantar flexion  Neg SLR b/l Skin: Warm and Dry Psych: Atypical affect    Assessment & Plan:  60 y/o pmh of anxiety presents right lower back pain with b/l LE radiation  1. Mechanical low back pain  MRI 10/2015  showing L5 impingement on Left  Pt awaiting visit with Neurosurgery after she has $650?  Pt had benefit from chiropractor, but it was self-pay and she has financial limitations  She states Percocet works for her  Will refer pt for PT for evaluation and treatment for back pain with stretching  and HEP, TENs  Encouraged Pool therapy  Cont Valium, but not to take with other sedative meds  Cont cold  After prolonged tangential discussion with patient, will increase gabapentin to 300 TID, may consider further increase.  Will order Elavil 75mg  qhs  Will order Mobic 7.5mg  daily.  Will refer to Psychology, pt to inquire if she will be able to afford this  Will consider [employer] in future  UDS obtained  2. Abnormality of gait  Cont cane and walker for safety  3. Anxiety  Cont meds  4. ?Dementia  Atypical affect with repetition, tangents, and overexageration  5. Insomnia  Secondary to pain  Will order Elavil 75mg

## 2016-02-07 NOTE — Telephone Encounter (Signed)
Patient's pharmacy [facility] called to request a prescription for Xanax....  Pharmacy was informed patient was taken off from [facility], it is discontinued on her med list  Pharmacy called and stated patient is not aware of being taken off from this medication and asked to please call patient for clarification

## 2016-02-07 NOTE — Telephone Encounter (Signed)
    Expand All Collapse All    Patient's pharmacy [facility] called to request a prescription for Xanax....  Pharmacy was informed patient was taken off from [facility], it is discontinued on her med list  Pharmacy called and stated patient is not aware of being taken off from this medication and asked to please call patient for clarification

## 2016-02-12 MED ORDER — DIAZEPAM 5 MG PO TABS: 5.0000 mg | ORAL_TABLET | Freq: Two times a day (BID) | ORAL | Status: DC | PRN

## 2016-02-12 NOTE — Telephone Encounter (Signed)
Pt. Called requesting to speak to her nurse regarding her XANAX.  Please f/u with pt.

## 2016-02-12 NOTE — Telephone Encounter (Signed)
Please call back to patient This is from her AVS from her OV on 11/02/2015 "Please replace xanax with valium, first at night, then twice daily for anxiety and muscle spasms"  I have placed a refill for valium up front. This is a controlled substance, so she will need to come in and sign the controlled substance contact.

## 2016-02-13 ENCOUNTER — Other Ambulatory Visit: Payer: Self-pay | Admitting: Family Medicine

## 2016-02-13 DIAGNOSIS — Z1231 Encounter for screening mammogram for malignant neoplasm of breast: Secondary | ICD-10-CM

## 2016-02-13 NOTE — Telephone Encounter (Signed)
Pt stated Valium do not help for her Anxiety  Pt requesting Xanax refilled  Pt has F/U appointment, will talk with PCP tomorrow

## 2016-02-14 ENCOUNTER — Ambulatory Visit: Payer: Medicaid Other | Attending: Family Medicine | Admitting: Family Medicine

## 2016-02-14 VITALS — BP 125/85 | HR 96 | Temp 98.2°F | Resp 16 | Ht 64.0 in | Wt 179.8 lb

## 2016-02-14 DIAGNOSIS — M5116 Intervertebral disc disorders with radiculopathy, lumbar region: Secondary | ICD-10-CM

## 2016-02-14 DIAGNOSIS — M5432 Sciatica, left side: Secondary | ICD-10-CM

## 2016-02-14 DIAGNOSIS — F411 Generalized anxiety disorder: Secondary | ICD-10-CM | POA: Diagnosis not present

## 2016-02-14 MED ORDER — ALPRAZOLAM 0.5 MG PO TABS: 0.5000 mg | ORAL_TABLET | Freq: Every day | ORAL | Status: DC | PRN

## 2016-02-14 MED ORDER — GABAPENTIN 300 MG PO CAPS: 300.0000 mg | ORAL_CAPSULE | Freq: Every day | ORAL | Status: DC

## 2016-02-14 MED ORDER — AMITRIPTYLINE HCL 75 MG PO TABS: ORAL_TABLET | ORAL | Status: DC

## 2016-02-14 NOTE — Patient Instructions (Addendum)
Meredith Church was seen today for anxiety.  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 -     amitriptyline (ELAVIL) 75 MG tablet; Start with 1/2 tablet, 37.5 mg nightly for two weeks, then 75 mg nightly  Anxiety state -     ALPRAZolam (XANAX) 0.5 MG tablet; Take 1 tablet (0.5 mg total) by mouth daily as needed for anxiety or sleep.   F/u in 6 weeks for anxiety   Dr. Adrian Blackwater

## 2016-02-14 NOTE — Progress Notes (Signed)
F/U anxiety

## 2016-02-14 NOTE — Progress Notes (Signed)
Subjective:  Patient ID: Meredith Church, female    DOB: [birth year]  Age: 60 y.o. MRN: [id number]  CC: Anxiety   HPI Meredith Church presents for   1. Anxiety: chronic intermittent anxiety with trouble sleeping. She tried elavil 75 mg prescribed by pain management for sciatica but experienced insomnia. She takes gabapentin 300 mg at night. She tried 300 mg TID but experienced somnolence during the day. She no longer works, so her stress and pain levels are lower overall. She has long hx of anxiety. She request refill of xanax. She reports valium does not help with her anxiety. No SI.   Social History  Substance Use Topics  . Smoking status: Current Every Day Smoker -- 0.50 packs/day    Types: Cigarettes    Last Attempt to Quit: 07/18/2014  . Smokeless tobacco: Never Used     Comment: Smokes socially.  . Alcohol Use: No    Outpatient Prescriptions Prior to Visit  Medication Sig Dispense Refill  . albuterol (PROVENTIL HFA;VENTOLIN HFA) 108 (90 BASE) MCG/ACT inhaler Inhale 2 puffs into the lungs every 6 (six) hours as needed for wheezing or shortness of breath. 1 Inhaler 0  . amitriptyline (ELAVIL) 150 MG tablet Take 0.5 tablets (75 mg total) by mouth at bedtime. 15 tablet 1  . amLODipine (NORVASC) 10 MG tablet Take 1 tablet (10 mg total) by mouth daily. 30 tablet 5  . ammonium lactate (LAC-HYDRIN) 12 % cream Apply topically as needed for dry skin. 385 g 0  . Biotin 1000 MCG tablet Take 2,000 mcg by mouth daily.    . diazepam (VALIUM) 5 MG tablet Take 1 tablet (5 mg total) by mouth every 12 (twelve) hours as needed for muscle spasms. 60 tablet 2  . diclofenac (VOLTAREN) 75 MG EC tablet Take 1 tablet (75 mg total) by mouth 2 (two) times daily. 30 tablet 0  . gabapentin (NEURONTIN) 300 MG capsule Take 1 capsule (300 mg total) by mouth 3 (three) times daily. 90 capsule 1  . L-LYSINE PO Take 1 tablet by mouth daily.    . meloxicam (MOBIC) 7.5 MG tablet Take 1 tablet (7.5 mg total) by mouth  daily. 30 tablet 1  . metoprolol (LOPRESSOR) 50 MG tablet Take 1 tablet (50 mg total) by mouth 2 (two) times daily. 60 tablet 5  . Misc. Devices (CANE) MISC 1 each by Does not apply route daily. 1 each 0  . triamcinolone ointment (KENALOG) 0.5 % Apply 1 application topically 2 (two) times daily. Apply to hands 30 g 2  . vitamin C (ASCORBIC ACID) 500 MG tablet Take 500 mg by mouth daily.     No facility-administered medications prior to visit.    ROS Review of Systems  Constitutional: Negative for fever and chills.  Eyes: Negative for visual disturbance.  Respiratory: Negative for shortness of breath.   Cardiovascular: Negative for chest pain.  Gastrointestinal: Negative for abdominal pain and blood in stool.  Musculoskeletal: Positive for back pain and gait problem. Negative for arthralgias.  Skin: Negative for rash.  Allergic/Immunologic: Negative for immunocompromised state.  Neurological: Positive for numbness (L lateral leg ).  Hematological: Negative for adenopathy. Does not bruise/bleed easily.  Psychiatric/Behavioral: Positive for dysphoric mood. Negative for suicidal ideas. The patient is nervous/anxious.     Objective:  BP 125/85 mmHg  Pulse 96  Temp(Src) 98.2 F (36.8 C) (Oral)  Resp 16  Ht 5\' 4"  (1.626 m)  Wt 179 lb 12.8 oz (81.557 kg)  BMI  30.85 kg/m2  SpO2 94%  BP/Weight 02/14/2016 99991111 0000000  Systolic BP 0000000 [id number] 0000000  Diastolic BP 85 77 76  Wt. (Lbs) 179.8 - 183  BMI 30.85 - 31.4    Physical Exam  Constitutional: She is oriented to person, place, and time. She appears well-developed and well-nourished. No distress.  HENT:  Head: Normocephalic and atraumatic.  Cardiovascular: Normal rate, regular rhythm, normal heart sounds and intact distal pulses.   Pulmonary/Chest: Effort normal and breath sounds normal.  Musculoskeletal: She exhibits no edema.  Neurological: She is alert and oriented to person, place, and time.  Skin: Skin is warm and dry. No  rash noted.  Psychiatric: Her speech is normal and behavior is normal. Judgment and thought content normal. Cognition and memory are normal. She exhibits a depressed mood.   Depression screen [facility] 2/9 02/14/2016 11/02/2015 05/07/2015 01/17/2015 11/16/2014  Decreased Interest 2 2 0 0 0  Down, Depressed, Hopeless 1 2 0 0 0  PHQ - 2 Score 3 4 0 0 0  Altered sleeping 3 1 - - -  Tired, decreased energy 1 2 - - -  Change in appetite 0 2 - - -  Feeling bad or failure about yourself  1 1 - - -  Trouble concentrating 2 1 - - -  Moving slowly or fidgety/restless 0 0 - - -  Suicidal thoughts 0 0 - - -  PHQ-9 Score 10 11 - - -  Difficult doing work/chores Not difficult at all - - - -    GAD 7 : Generalized Anxiety Score 02/14/2016 11/02/2015  Nervous, Anxious, on Edge 3 3  Control/stop worrying 3 3  Worry too much - different things 3 3  Trouble relaxing 3 3  Restless 1 1  Easily annoyed or irritable 1 2  Afraid - awful might happen 1 3  Total GAD 7 Score 15 18      Assessment & Plan:   There are no diagnoses linked to this encounter.  Meredith Church was seen today for anxiety.  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 -     amitriptyline (ELAVIL) 75 MG tablet; Start with 1/2 tablet, 37.5 mg nightly for two weeks, then 75 mg nightly  Anxiety state -     ALPRAZolam (XANAX) 0.5 MG tablet; Take 1 tablet (0.5 mg total) by mouth daily as needed for anxiety or sleep.   Meds ordered this encounter  Medications  . gabapentin (NEURONTIN) 300 MG capsule    Sig: Take 1 capsule (300 mg total) by mouth at bedtime.    Dispense:  90 capsule    Refill:  1  . amitriptyline (ELAVIL) 75 MG tablet    Sig: Start with 1/2 tablet, 37.5 mg nightly for two weeks, then 75 mg nightly    Dispense:  30 tablet    Refill:  1  . ALPRAZolam (XANAX) 0.5 MG  tablet    Sig: Take 1 tablet (0.5 mg total) by mouth daily as needed for anxiety or sleep.    Dispense:  30 tablet    Refill:  0    Follow-up: No Follow-up on file.   Boykin Nearing MD

## 2016-02-15 ENCOUNTER — Encounter: Payer: Self-pay | Admitting: Family Medicine

## 2016-02-15 NOTE — Assessment & Plan Note (Signed)
Persistent Changed gabapentin to 300 mg qHS Decrease elavil to 37.5 mg nightly for first 1-2 weeks, then 75 mg

## 2016-02-15 NOTE — Assessment & Plan Note (Signed)
Chronic Refilled xanax for intermittent prn use

## 2016-02-18 LAB — TOXASSURE SELECT,+ANTIDEPR,UR

## 2016-02-19 ENCOUNTER — Telehealth: Payer: Self-pay

## 2016-02-19 ENCOUNTER — Ambulatory Visit: Payer: Self-pay

## 2016-02-19 NOTE — Telephone Encounter (Signed)
Pt's UDS resulted inconsistent. THC was present. Please advise on what you would like to do regards to warnings/discharge.Marland KitchenMarland Kitchen

## 2016-02-21 MED FILL — ?AMLODIPINE BESYLATE 10 MG: 10 | 30 days supply | Qty: 30 | Fill #1

## 2016-02-21 NOTE — Telephone Encounter (Signed)
She was not prescribed any narcotics on last visit.  We may proceed with a warning.  Thank you.

## 2016-02-25 ENCOUNTER — Ambulatory Visit
Admission: RE | Admit: 2016-02-25 | Discharge: 2016-02-25 | Disposition: A | Discharge status: Home / Self Care | Payer: No Typology Code available for payment source | Source: Ambulatory Visit | Attending: Family Medicine | Admitting: Family Medicine

## 2016-02-25 DIAGNOSIS — Z1231 Encounter for screening mammogram for malignant neoplasm of breast: Secondary | ICD-10-CM

## 2016-03-03 ENCOUNTER — Ambulatory Visit: Payer: No Typology Code available for payment source | Attending: Physical Medicine & Rehabilitation

## 2016-03-20 ENCOUNTER — Encounter: Payer: Self-pay | Attending: Physical Medicine & Rehabilitation | Admitting: Physical Medicine & Rehabilitation

## 2016-03-20 DIAGNOSIS — R269 Unspecified abnormalities of gait and mobility: Secondary | ICD-10-CM | POA: Insufficient documentation

## 2016-03-20 DIAGNOSIS — Z5181 Encounter for therapeutic drug level monitoring: Secondary | ICD-10-CM | POA: Insufficient documentation

## 2016-03-20 DIAGNOSIS — G894 Chronic pain syndrome: Secondary | ICD-10-CM | POA: Insufficient documentation

## 2016-03-20 DIAGNOSIS — M5442 Lumbago with sciatica, left side: Secondary | ICD-10-CM | POA: Insufficient documentation

## 2016-03-20 DIAGNOSIS — F17210 Nicotine dependence, cigarettes, uncomplicated: Secondary | ICD-10-CM | POA: Insufficient documentation

## 2016-03-20 DIAGNOSIS — R200 Anesthesia of skin: Secondary | ICD-10-CM | POA: Insufficient documentation

## 2016-03-20 DIAGNOSIS — F411 Generalized anxiety disorder: Secondary | ICD-10-CM | POA: Insufficient documentation

## 2016-03-20 DIAGNOSIS — M5441 Lumbago with sciatica, right side: Secondary | ICD-10-CM | POA: Insufficient documentation

## 2016-03-20 DIAGNOSIS — R61 Generalized hyperhidrosis: Secondary | ICD-10-CM | POA: Insufficient documentation

## 2016-03-20 DIAGNOSIS — G4700 Insomnia, unspecified: Secondary | ICD-10-CM | POA: Insufficient documentation

## 2016-03-20 DIAGNOSIS — I10 Essential (primary) hypertension: Secondary | ICD-10-CM | POA: Insufficient documentation

## 2016-03-20 DIAGNOSIS — E785 Hyperlipidemia, unspecified: Secondary | ICD-10-CM | POA: Insufficient documentation

## 2016-03-20 DIAGNOSIS — Z79899 Other long term (current) drug therapy: Secondary | ICD-10-CM | POA: Insufficient documentation

## 2016-03-20 DIAGNOSIS — G8929 Other chronic pain: Secondary | ICD-10-CM | POA: Insufficient documentation

## 2016-03-20 DIAGNOSIS — F0390 Unspecified dementia without behavioral disturbance: Secondary | ICD-10-CM | POA: Insufficient documentation

## 2016-03-20 DIAGNOSIS — Z90710 Acquired absence of both cervix and uterus: Secondary | ICD-10-CM | POA: Insufficient documentation

## 2016-03-20 DIAGNOSIS — R531 Weakness: Secondary | ICD-10-CM | POA: Insufficient documentation

## 2016-03-24 MED FILL — AMLODIPINE BESYLATE 10 MG T: 10 | 30 days supply | Qty: 30 | Fill #2

## 2016-04-24 MED FILL — AMLODIPINE BESYLATE 10 MG T: 10 | 30 days supply | Qty: 30 | Fill #3

## 2016-05-26 MED FILL — AMLODIPINE BESYLATE 10 MG T: 10 | 30 days supply | Qty: 30 | Fill #4

## 2016-05-27 ENCOUNTER — Other Ambulatory Visit: Payer: Self-pay | Admitting: Family Medicine

## 2016-05-27 DIAGNOSIS — I10 Essential (primary) hypertension: Secondary | ICD-10-CM

## 2016-05-27 MED FILL — ?METOPROLOL 50 MG TABLET: 50 | 30 days supply | Qty: 60 | Fill #0

## 2016-06-03 ENCOUNTER — Ambulatory Visit: Payer: Medicaid Other | Attending: Family Medicine | Admitting: Family Medicine

## 2016-06-03 ENCOUNTER — Encounter: Payer: Self-pay | Admitting: Family Medicine

## 2016-06-03 VITALS — BP 124/82 | HR 97 | Temp 98.1°F | Ht 64.0 in | Wt 178.6 lb

## 2016-06-03 DIAGNOSIS — F17210 Nicotine dependence, cigarettes, uncomplicated: Secondary | ICD-10-CM | POA: Insufficient documentation

## 2016-06-03 DIAGNOSIS — Z79899 Other long term (current) drug therapy: Secondary | ICD-10-CM | POA: Diagnosis not present

## 2016-06-03 DIAGNOSIS — M5432 Sciatica, left side: Secondary | ICD-10-CM

## 2016-06-03 DIAGNOSIS — F411 Generalized anxiety disorder: Secondary | ICD-10-CM

## 2016-06-03 DIAGNOSIS — F419 Anxiety disorder, unspecified: Secondary | ICD-10-CM | POA: Insufficient documentation

## 2016-06-03 DIAGNOSIS — I10 Essential (primary) hypertension: Secondary | ICD-10-CM | POA: Diagnosis not present

## 2016-06-03 DIAGNOSIS — M5116 Intervertebral disc disorders with radiculopathy, lumbar region: Secondary | ICD-10-CM

## 2016-06-03 DIAGNOSIS — M5117 Intervertebral disc disorders with radiculopathy, lumbosacral region: Secondary | ICD-10-CM | POA: Diagnosis present

## 2016-06-03 MED ORDER — MELOXICAM 7.5 MG PO TABS: 7.5000 mg | ORAL_TABLET | Freq: Every day | ORAL | 3 refills | Status: DC

## 2016-06-03 MED ORDER — AMITRIPTYLINE HCL 75 MG PO TABS: ORAL_TABLET | ORAL | 3 refills | Status: DC

## 2016-06-03 MED ORDER — ALPRAZOLAM 0.5 MG PO TABS: 0.5000 mg | ORAL_TABLET | Freq: Every day | ORAL | 2 refills | Status: DC | PRN

## 2016-06-03 MED FILL — AMITRIPTYLINE HCL 75 MG TAB: 75 | 30 days supply | Qty: 30 | Fill #0

## 2016-06-03 MED FILL — MELOXICAM 7.5 MG TABLET: 7.5 | 30 days supply | Qty: 30 | Fill #0

## 2016-06-03 NOTE — Progress Notes (Signed)
Subjective:  Patient ID: Meredith Church, female    DOB: 01/07/1956  Age: 60 y.o. MRN: [id number]  CC: Hypertension and Anxiety   HPI Meredith Church presents for   1. Anxiety: chronic intermittent anxiety with trouble sleeping. She is taking xanax and request med refill. She is willing to try elavil again. She has not returned to work. She has been approved for medicaid and disability up to $16,000 per year over her social security income. She is recently stressed because her brother who is 2 years younger was recently diagnosed with colon cancer.    2. 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 She has medicaid now. She request referral to neurosurgery and orthopaedic. She has pain and stiffness down L leg. She has L leg weakness at times. She keeps a cane in her car.   Lumbar MRI 11/01/15 IMPRESSION: The patient appears to have transitional anatomy at the lumbosacral junction as described above. Recommend correlation with plain films prior to any intervention.  Shallow disc bulge with a superimposed down turning central protrusion at L4-5 results in some narrowing of the lateral recesses at the L4-5 level. The down turning protrusion contacts the descending left L5 root  Social History  Substance Use Topics  . Smoking status: Current Every Day Smoker    Packs/day: 0.50    Types: Cigarettes    Last attempt to quit: 07/18/2014  . Smokeless tobacco: Never Used     Comment: Smokes socially.  . Alcohol use No    Outpatient Medications Prior to Visit  Medication Sig Dispense Refill  . albuterol (PROVENTIL HFA;VENTOLIN HFA) 108 (90 BASE) MCG/ACT inhaler Inhale 2 puffs into the lungs every 6 (six) hours as needed for wheezing or shortness of breath. 1 Inhaler 0  . ALPRAZolam (XANAX) 0.5 MG tablet Take 1 tablet (0.5 mg total) by mouth daily as needed for anxiety or sleep. 30 tablet 0  . amLODipine (NORVASC)  10 MG tablet Take 1 tablet (10 mg total) by mouth daily. 30 tablet 5  . ammonium lactate (LAC-HYDRIN) 12 % cream Apply topically as needed for dry skin. 385 g 0  . Biotin 1000 MCG tablet Take 2,000 mcg by mouth daily.    Marland Kitchen gabapentin (NEURONTIN) 300 MG capsule Take 1 capsule (300 mg total) by mouth at bedtime. 90 capsule 1  . metoprolol (LOPRESSOR) 50 MG tablet TAKE 1 TABLET BY MOUTH 2 TIMES DAILY 60 tablet 11  . Misc. Devices (CANE) MISC 1 each by Does not apply route daily. 1 each 0  . triamcinolone ointment (KENALOG) 0.5 % Apply 1 application topically 2 (two) times daily. Apply to hands 30 g 2  . vitamin C (ASCORBIC ACID) 500 MG tablet Take 500 mg by mouth daily.    Marland Kitchen amitriptyline (ELAVIL) 75 MG tablet Start with 1/2 tablet, 37.5 mg nightly for two weeks, then 75 mg nightly (Patient not taking: Reported on 06/03/2016) 30 tablet 1  . diclofenac (VOLTAREN) 75 MG EC tablet Take 1 tablet (75 mg total) by mouth 2 (two) times daily. (Patient not taking: Reported on 06/03/2016) 30 tablet 0  . L-LYSINE PO Take 1 tablet by mouth daily.    . meloxicam (MOBIC) 7.5 MG tablet Take 1 tablet (7.5 mg total) by mouth daily. (Patient not taking: Reported on 06/03/2016) 30 tablet 1   No facility-administered medications prior to visit.  ROS Review of Systems  Constitutional: Negative for chills and fever.  Eyes: Negative for visual disturbance.  Respiratory: Negative for shortness of breath.   Cardiovascular: Negative for chest pain.  Gastrointestinal: Negative for abdominal pain and blood in stool.  Musculoskeletal: Positive for back pain and gait problem. Negative for arthralgias.  Skin: Negative for rash.  Allergic/Immunologic: Negative for immunocompromised state.  Neurological: Positive for numbness (L lateral leg ).  Hematological: Negative for adenopathy. Does not bruise/bleed easily.  Psychiatric/Behavioral: Positive for dysphoric mood. Negative for suicidal ideas. The patient is  nervous/anxious.     Objective:  BP 124/82 (BP Location: Right Arm, Patient Position: Sitting, Cuff Size: Small)   Pulse 97   Temp 98.1 F (36.7 C) (Oral)   Ht 5\' 4"  (1.626 m)   Wt 178 lb 9.6 oz (81 kg)   SpO2 94%   BMI 30.66 kg/m   BP/Weight 06/03/2016 02/14/2016 [id number]  Systolic BP [id number]  Diastolic BP 82 85 77  Wt. (Lbs) 178.6 179.8 -  BMI 30.66 30.85 -    Physical Exam  Constitutional: She is oriented to person, place, and time. She appears well-developed and well-nourished. No distress.  HENT:  Head: Normocephalic and atraumatic.  Cardiovascular: Normal rate, regular rhythm, normal heart sounds and intact distal pulses.   Pulmonary/Chest: Effort normal and breath sounds normal.  Musculoskeletal: She exhibits no edema.  Neurological: She is alert and oriented to person, place, and time.  Skin: Skin is warm and dry. No rash noted.  Psychiatric: Her speech is normal and behavior is normal. Judgment and thought content normal. Cognition and memory are normal. She exhibits a depressed mood.   Depression screen [facility] 2/9 06/03/2016 06/03/2016 02/14/2016 11/02/2015 05/07/2015  Decreased Interest 2 2 2 2  0  Down, Depressed, Hopeless 1 1 1 2  0  PHQ - 2 Score 3 3 3 4  0  Altered sleeping 3 - 3 1 -  Tired, decreased energy 1 - 1 2 -  Change in appetite 0 - 0 2 -  Feeling bad or failure about yourself  0 - 1 1 -  Trouble concentrating 1 - 2 1 -  Moving slowly or fidgety/restless 0 - 0 0 -  Suicidal thoughts 0 - 0 0 -  PHQ-9 Score 8 - 10 11 -  Difficult doing work/chores - - Not difficult at all - -    GAD 7 : Generalized Anxiety Score 06/03/2016 02/14/2016 11/02/2015  Nervous, Anxious, on Edge 2 3 3   Control/stop worrying 3 3 3   Worry too much - different things 3 3 3   Trouble relaxing 3 3 3   Restless 2 1 1   Easily annoyed or irritable 1 1 2   Afraid - awful might happen 1 1 3   Total GAD 7 Score 15 15 18       Assessment & Plan:  Meredith Church was seen today for hypertension and  anxiety.  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 -     amitriptyline (ELAVIL) 75 MG tablet; Start with 1/2 tablet, 37.5 mg nightly for two weeks, then 75 mg nightly  Anxiety state -     ALPRAZolam (XANAX) 0.5 MG tablet; Take 1 tablet (0.5 mg total) by  mouth daily as needed for anxiety or sleep.   There are no diagnoses linked to this encounter.  There are no diagnoses linked to this encounter. No orders of the defined types were placed in this encounter.   Follow-up: Return in about 2 months (around 08/03/2016) for sciatica .   Boykin Nearing MD

## 2016-06-03 NOTE — Assessment & Plan Note (Signed)
A; chronic P:  refilled xanax Referral to mental health

## 2016-06-03 NOTE — Assessment & Plan Note (Signed)
A: chronic with chronic pain and gait dysfunction P: Add back elavil 75 mg nightly Continue use of cane Referral to ortho and neurosurgery

## 2016-06-03 NOTE — Patient Instructions (Addendum)
Meredith Church was seen today for hypertension and anxiety.  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 -     amitriptyline (ELAVIL) 75 MG tablet; Start with 1/2 tablet, 37.5 mg nightly for two weeks, then 75 mg nightly  Anxiety state -     ALPRAZolam (XANAX) 0.5 MG tablet; Take 1 tablet (0.5 mg total) by mouth daily as needed for anxiety or sleep.   F/u in 2 months for sciatica   Dr .Adrian Blackwater

## 2016-06-23 ENCOUNTER — Encounter (INDEPENDENT_AMBULATORY_CARE_PROVIDER_SITE_OTHER): Payer: Self-pay

## 2016-06-23 ENCOUNTER — Telehealth: Payer: Self-pay | Admitting: Family Medicine

## 2016-06-23 MED FILL — AMLODIPINE BESYLATE 10 MG T: 10 | 30 days supply | Qty: 30 | Fill #5

## 2016-06-23 MED FILL — METOPROLOL TARTRATE 50 MG T: 50 | 30 days supply | Qty: 60 | Fill #1

## 2016-06-23 NOTE — Telephone Encounter (Signed)
Pt came in stating that she needs a referral sent to [facility].   Pt was originally referred to Neurological Surgery in [place], but they cannot see pt until January.  Pt's orthopedic surgeon stated he cannot see pt until pt has seen neurosurgeon so they referred Pt to [facility] which can see pt this Thursday, 10/12  Requesting referral be faxed to office attn. Rollene Fare    phone# [phone]  fax: [phone]

## 2016-06-24 NOTE — Telephone Encounter (Signed)
Noted  Per Patient request  Sent Referral to [facility] ph. # [phone] Fax# [phone] .They will contact the patient to schedule an appointment . Thank you

## 2016-06-26 ENCOUNTER — Other Ambulatory Visit: Payer: Self-pay | Admitting: Neurological Surgery

## 2016-07-02 ENCOUNTER — Encounter (HOSPITAL_COMMUNITY)
Admission: RE | Admit: 2016-07-02 | Discharge: 2016-07-02 | Disposition: A | Discharge status: Home / Self Care | Payer: Medicaid Other | Source: Ambulatory Visit | Attending: Neurological Surgery | Admitting: Neurological Surgery

## 2016-07-02 ENCOUNTER — Encounter (HOSPITAL_COMMUNITY): Payer: Self-pay

## 2016-07-02 DIAGNOSIS — I491 Atrial premature depolarization: Secondary | ICD-10-CM | POA: Diagnosis not present

## 2016-07-02 DIAGNOSIS — I10 Essential (primary) hypertension: Secondary | ICD-10-CM | POA: Insufficient documentation

## 2016-07-02 DIAGNOSIS — Z01818 Encounter for other preprocedural examination: Secondary | ICD-10-CM | POA: Diagnosis not present

## 2016-07-02 DIAGNOSIS — M5126 Other intervertebral disc displacement, lumbar region: Secondary | ICD-10-CM | POA: Insufficient documentation

## 2016-07-02 DIAGNOSIS — Z01812 Encounter for preprocedural laboratory examination: Secondary | ICD-10-CM | POA: Insufficient documentation

## 2016-07-02 HISTORY — DX: Paresthesia of skin: R20.0

## 2016-07-02 HISTORY — DX: Dermatitis, unspecified: L30.9

## 2016-07-02 HISTORY — DX: Paresthesia of skin: R20.2

## 2016-07-02 LAB — BASIC METABOLIC PANEL
Anion gap: 10 (ref 5–15)
BUN: 11 mg/dL (ref 6–20)
CO2: 26 mmol/L (ref 22–32)
Calcium: 9.7 mg/dL (ref 8.9–10.3)
Chloride: 104 mmol/L (ref 101–111)
Creatinine, Ser: 0.63 mg/dL (ref 0.44–1.00)
GFR calc Af Amer: >60 mL/min (ref >60)
GFR calc non Af Amer: >60 mL/min (ref >60)
Glucose, Bld: 107 mg/dL — ABNORMAL HIGH (ref 65–99)
Potassium: 3.8 mmol/L (ref 3.5–5.1)
Sodium: 140 mmol/L (ref 135–145)

## 2016-07-02 LAB — CBC
HCT: 46.3 % — ABNORMAL HIGH (ref 36.0–46.0)
Hemoglobin: 16.1 g/dL — ABNORMAL HIGH (ref 12.0–15.0)
MCH: 34.1 pg — ABNORMAL HIGH (ref 26.0–34.0)
MCHC: 34.8 g/dL (ref 30.0–36.0)
MCV: 98.1 fL (ref 78.0–100.0)
Platelets: 236 10*3/uL (ref 150–400)
RBC: 4.72 MIL/uL (ref 3.87–5.11)
RDW: 13.7 % (ref 11.5–15.5)
WBC: 6.1 10*3/uL (ref 4.0–10.5)

## 2016-07-02 LAB — SURGICAL PCR SCREEN
MRSA, PCR: NEGATIVE
Staphylococcus aureus: POSITIVE — AB

## 2016-07-02 NOTE — Progress Notes (Signed)
Mupirocin Ointment [facility] [address] for positive PCR of Staph. Notified pt. She is not sure if she can pick it up tonight and/or if she can afford it. I told her that if she can't afford it or pick it up, we will treat her here in the AM when she comes in for her surgery. She voiced understanding.

## 2016-07-02 NOTE — Pre-Procedure Instructions (Signed)
    Meredith Church  07/02/2016      [id number] [address], [place] [address] Phone: [phone] Fax: [place], [address] [place] [address] Phone: [phone] Fax: [phone]    Your procedure is scheduled on October 19.  Report to [facility] Admitting at 5:30 A.M.  Call this number if you have problems the morning of surgery:  [phone]   Remember:  Do not eat food or drink liquids after midnight.  Take these medicines the morning of surgery with A SIP OF WATER : metoprolol (LOPRESSOR), amLODipine (NORVASC), IF NEEDED- albuterol  (PROVENTIL HFA;VENTOLIN HFA), ALPRAZolam (XANAX)   STOP aspirin, meloxicam (MOBIC), NSIAD'S (advil, aleve, ibuprofen, motrin), herbal medications    Do not wear jewelry, make-up or nail polish.  Do not wear lotions, powders, or perfumes, or deoderant.  Do not shave 48 hours prior to surgery.    Do not bring valuables to the hospital.  [facility] is not responsible for any belongings or valuables.  Contacts, dentures or bridgework may not be worn into surgery.  Leave your suitcase in the car.  After surgery it may be brought to your room.  For patients admitted to the hospital, discharge time will be determined by your treatment team.  Patients discharged the day of surgery will not be allowed to drive home.   Name and phone number of your driver:    Special instructions:  Preparing for sugery  Please read over the following fact sheets that you were given.

## 2016-07-03 ENCOUNTER — Ambulatory Visit (HOSPITAL_COMMUNITY): Payer: Medicaid Other

## 2016-07-03 ENCOUNTER — Ambulatory Visit (HOSPITAL_COMMUNITY): Payer: Medicaid Other | Admitting: Emergency Medicine

## 2016-07-03 ENCOUNTER — Encounter (HOSPITAL_COMMUNITY): Payer: Self-pay | Admitting: *Deleted

## 2016-07-03 ENCOUNTER — Encounter (HOSPITAL_COMMUNITY)
Admission: RE | Disposition: A | Discharge status: Home / Self Care | Payer: Self-pay | Source: Ambulatory Visit | Attending: Neurological Surgery

## 2016-07-03 ENCOUNTER — Ambulatory Visit (HOSPITAL_COMMUNITY)
Admission: RE | Admit: 2016-07-03 | Discharge: 2016-07-04 | Disposition: A | Discharge status: Home / Self Care | Payer: Medicaid Other | Source: Ambulatory Visit | Attending: Neurological Surgery | Admitting: Neurological Surgery

## 2016-07-03 ENCOUNTER — Ambulatory Visit (HOSPITAL_COMMUNITY): Payer: Medicaid Other | Admitting: Anesthesiology

## 2016-07-03 DIAGNOSIS — F17210 Nicotine dependence, cigarettes, uncomplicated: Secondary | ICD-10-CM | POA: Insufficient documentation

## 2016-07-03 DIAGNOSIS — I10 Essential (primary) hypertension: Secondary | ICD-10-CM | POA: Diagnosis not present

## 2016-07-03 DIAGNOSIS — M4726 Other spondylosis with radiculopathy, lumbar region: Secondary | ICD-10-CM | POA: Insufficient documentation

## 2016-07-03 DIAGNOSIS — M5116 Intervertebral disc disorders with radiculopathy, lumbar region: Secondary | ICD-10-CM | POA: Insufficient documentation

## 2016-07-03 DIAGNOSIS — M5126 Other intervertebral disc displacement, lumbar region: Secondary | ICD-10-CM

## 2016-07-03 DIAGNOSIS — I700 Atherosclerosis of aorta: Secondary | ICD-10-CM | POA: Diagnosis not present

## 2016-07-03 DIAGNOSIS — M21371 Foot drop, right foot: Secondary | ICD-10-CM | POA: Diagnosis not present

## 2016-07-03 DIAGNOSIS — Z90710 Acquired absence of both cervix and uterus: Secondary | ICD-10-CM | POA: Insufficient documentation

## 2016-07-03 DIAGNOSIS — M4727 Other spondylosis with radiculopathy, lumbosacral region: Secondary | ICD-10-CM | POA: Diagnosis not present

## 2016-07-03 DIAGNOSIS — M48061 Spinal stenosis, lumbar region without neurogenic claudication: Secondary | ICD-10-CM | POA: Insufficient documentation

## 2016-07-03 DIAGNOSIS — F419 Anxiety disorder, unspecified: Secondary | ICD-10-CM | POA: Insufficient documentation

## 2016-07-03 DIAGNOSIS — E785 Hyperlipidemia, unspecified: Secondary | ICD-10-CM | POA: Insufficient documentation

## 2016-07-03 HISTORY — PX: LUMBAR LAMINECTOMY/DECOMPRESSION MICRODISCECTOMY: SHX5026

## 2016-07-03 SURGERY — LUMBAR LAMINECTOMY/DECOMPRESSION MICRODISCECTOMY 1 LEVEL
Anesthesia: General

## 2016-07-03 MED ORDER — FLEET ENEMA 7-19 GM/118ML RE ENEM: 1.0000 | ENEMA | Freq: Once | RECTAL | Status: DC | PRN

## 2016-07-03 MED ORDER — BUPIVACAINE-EPINEPHRINE (PF) 0.5% -1:200000 IJ SOLN
INTRAMUSCULAR | Status: DC | PRN
  Administered 2016-07-03: 10 mL via PERINEURAL

## 2016-07-03 MED ORDER — MIDAZOLAM HCL 5 MG/5ML IJ SOLN
INTRAMUSCULAR | Status: DC | PRN
Start: 2016-07-03 — End: 2016-07-03
  Administered 2016-07-03: 2 mg via INTRAVENOUS

## 2016-07-03 MED ORDER — ONDANSETRON HCL 4 MG/2ML IJ SOLN: 4.0000 mg | INTRAMUSCULAR | Status: DC | PRN

## 2016-07-03 MED ORDER — CEFAZOLIN IN D5W 1 GM/50ML IV SOLN
1.0000 g | Freq: Three times a day (TID) | INTRAVENOUS | Status: AC
  Administered 2016-07-03 (×2): 1 g via INTRAVENOUS
  Filled 2016-07-03: qty 50

## 2016-07-03 MED ORDER — ONDANSETRON HCL 4 MG/2ML IJ SOLN
INTRAMUSCULAR | Status: AC
  Filled 2016-07-03: qty 2

## 2016-07-03 MED ORDER — VANCOMYCIN HCL 1000 MG IV SOLR
INTRAVENOUS | Status: AC
  Filled 2016-07-03: qty 1000

## 2016-07-03 MED ORDER — PROPOFOL 10 MG/ML IV BOLUS
INTRAVENOUS | Status: DC | PRN
  Administered 2016-07-03: 160 mg via INTRAVENOUS

## 2016-07-03 MED ORDER — METOPROLOL TARTRATE 50 MG PO TABS
50.0000 mg | ORAL_TABLET | Freq: Two times a day (BID) | ORAL | Status: DC
  Filled 2016-07-03 (×2): qty 1

## 2016-07-03 MED ORDER — FENTANYL CITRATE (PF) 100 MCG/2ML IJ SOLN
INTRAMUSCULAR | Status: DC | PRN
  Administered 2016-07-03: 100 ug via INTRAVENOUS
  Administered 2016-07-03 (×2): 50 ug via INTRAVENOUS

## 2016-07-03 MED ORDER — SODIUM CHLORIDE 0.9% FLUSH: 3.0000 mL | INTRAVENOUS | Status: DC | PRN

## 2016-07-03 MED ORDER — METHYLPREDNISOLONE ACETATE 80 MG/ML IJ SUSP
INTRAMUSCULAR | Status: DC | PRN
  Administered 2016-07-03: 1 mL

## 2016-07-03 MED ORDER — ALUM & MAG HYDROXIDE-SIMETH 200-200-20 MG/5ML PO SUSP: 30.0000 mL | Freq: Four times a day (QID) | ORAL | Status: DC | PRN

## 2016-07-03 MED ORDER — CELECOXIB 200 MG PO CAPS
200.0000 mg | ORAL_CAPSULE | Freq: Two times a day (BID) | ORAL | Status: DC
  Administered 2016-07-03: 200 mg via ORAL
  Filled 2016-07-03: qty 1

## 2016-07-03 MED ORDER — GABAPENTIN 300 MG PO CAPS
300.0000 mg | ORAL_CAPSULE | Freq: Three times a day (TID) | ORAL | Status: DC
  Administered 2016-07-03 (×2): 300 mg via ORAL
  Filled 2016-07-03 (×2): qty 1

## 2016-07-03 MED ORDER — OXYCODONE HCL 5 MG PO TABS: 5.0000 mg | ORAL_TABLET | Freq: Once | ORAL | Status: DC | PRN

## 2016-07-03 MED ORDER — THROMBIN 5000 UNITS EX SOLR
CUTANEOUS | Status: DC | PRN
  Administered 2016-07-03: 10000 [IU] via TOPICAL

## 2016-07-03 MED ORDER — OXYCODONE HCL 5 MG/5ML PO SOLN: 5.0000 mg | Freq: Once | ORAL | Status: DC | PRN

## 2016-07-03 MED ORDER — CHLORHEXIDINE GLUCONATE CLOTH 2 % EX PADS: 6.0000 | MEDICATED_PAD | Freq: Once | CUTANEOUS | Status: DC

## 2016-07-03 MED ORDER — METHOCARBAMOL 750 MG PO TABS
750.0000 mg | ORAL_TABLET | Freq: Four times a day (QID) | ORAL | Status: DC
  Administered 2016-07-03 (×3): 750 mg via ORAL
  Filled 2016-07-03 (×3): qty 1

## 2016-07-03 MED ORDER — LACTATED RINGERS IV SOLN
INTRAVENOUS | Status: DC | PRN
  Administered 2016-07-03 (×2): via INTRAVENOUS

## 2016-07-03 MED ORDER — BIOTIN 1000 MCG PO TABS: 2000.0000 ug | ORAL_TABLET | Freq: Every day | ORAL | Status: DC

## 2016-07-03 MED ORDER — MIDAZOLAM HCL 2 MG/2ML IJ SOLN
INTRAMUSCULAR | Status: AC
  Filled 2016-07-03: qty 2

## 2016-07-03 MED ORDER — DOCUSATE SODIUM 100 MG PO CAPS
100.0000 mg | ORAL_CAPSULE | Freq: Two times a day (BID) | ORAL | Status: DC
  Administered 2016-07-03 (×2): 100 mg via ORAL
  Filled 2016-07-03 (×2): qty 1

## 2016-07-03 MED ORDER — BISACODYL 10 MG RE SUPP: 10.0000 mg | Freq: Every day | RECTAL | Status: DC | PRN

## 2016-07-03 MED ORDER — AMITRIPTYLINE HCL 75 MG PO TABS
75.0000 mg | ORAL_TABLET | Freq: Every evening | ORAL | Status: DC | PRN
  Filled 2016-07-03: qty 1

## 2016-07-03 MED ORDER — KETOROLAC TROMETHAMINE 30 MG/ML IJ SOLN
INTRAMUSCULAR | Status: DC | PRN
  Administered 2016-07-03: 30 mg via INTRAMUSCULAR

## 2016-07-03 MED ORDER — FENTANYL CITRATE (PF) 100 MCG/2ML IJ SOLN
INTRAMUSCULAR | Status: AC
  Filled 2016-07-03: qty 4

## 2016-07-03 MED ORDER — ACETAMINOPHEN 500 MG PO TABS
1000.0000 mg | ORAL_TABLET | Freq: Four times a day (QID) | ORAL | Status: DC
  Administered 2016-07-03 – 2016-07-04 (×4): 1000 mg via ORAL
  Filled 2016-07-03 (×4): qty 2

## 2016-07-03 MED ORDER — CEFAZOLIN SODIUM-DEXTROSE 2-4 GM/100ML-% IV SOLN
2.0000 g | INTRAVENOUS | Status: AC
  Administered 2016-07-03: 2 g via INTRAVENOUS

## 2016-07-03 MED ORDER — LIDOCAINE 2% (20 MG/ML) 5 ML SYRINGE
INTRAMUSCULAR | Status: AC
  Filled 2016-07-03: qty 5

## 2016-07-03 MED ORDER — HEMOSTATIC AGENTS (NO CHARGE) OPTIME
TOPICAL | Status: DC | PRN
  Administered 2016-07-03: 1 via TOPICAL

## 2016-07-03 MED ORDER — ONDANSETRON HCL 4 MG/2ML IJ SOLN
INTRAMUSCULAR | Status: DC | PRN
  Administered 2016-07-03: 4 mg via INTRAVENOUS

## 2016-07-03 MED ORDER — ARTIFICIAL TEARS OP OINT
TOPICAL_OINTMENT | OPHTHALMIC | Status: AC
  Filled 2016-07-03: qty 3.5

## 2016-07-03 MED ORDER — LIDOCAINE-EPINEPHRINE 2 %-1:100000 IJ SOLN
INTRAMUSCULAR | Status: AC
  Filled 2016-07-03: qty 1

## 2016-07-03 MED ORDER — SODIUM CHLORIDE 0.9% FLUSH
3.0000 mL | Freq: Two times a day (BID) | INTRAVENOUS | Status: DC
  Administered 2016-07-03: 3 mL via INTRAVENOUS

## 2016-07-03 MED ORDER — MENTHOL 3 MG MT LOZG: 1.0000 | LOZENGE | OROMUCOSAL | Status: DC | PRN

## 2016-07-03 MED ORDER — PHENYLEPHRINE HCL 10 MG/ML IJ SOLN
INTRAVENOUS | Status: DC | PRN
  Administered 2016-07-03: 40 ug/min via INTRAVENOUS

## 2016-07-03 MED ORDER — SUGAMMADEX SODIUM 200 MG/2ML IV SOLN
INTRAVENOUS | Status: DC | PRN
  Administered 2016-07-03: 200 mg via INTRAVENOUS

## 2016-07-03 MED ORDER — THROMBIN 5000 UNITS EX SOLR
OROMUCOSAL | Status: DC | PRN
  Administered 2016-07-03: 09:00:00 via TOPICAL

## 2016-07-03 MED ORDER — PHENOL 1.4 % MT LIQD: 1.0000 | OROMUCOSAL | Status: DC | PRN

## 2016-07-03 MED ORDER — THROMBIN 5000 UNITS EX SOLR
CUTANEOUS | Status: AC
  Filled 2016-07-03: qty 15000

## 2016-07-03 MED ORDER — SENNA 8.6 MG PO TABS
1.0000 | ORAL_TABLET | Freq: Two times a day (BID) | ORAL | Status: DC
  Administered 2016-07-03 (×2): 8.6 mg via ORAL
  Filled 2016-07-03 (×2): qty 1

## 2016-07-03 MED ORDER — ROCURONIUM BROMIDE 100 MG/10ML IV SOLN
INTRAVENOUS | Status: DC | PRN
  Administered 2016-07-03: 10 mg via INTRAVENOUS
  Administered 2016-07-03: 50 mg via INTRAVENOUS

## 2016-07-03 MED ORDER — OXYCODONE HCL 5 MG PO TABS
5.0000 mg | ORAL_TABLET | ORAL | Status: DC | PRN
  Administered 2016-07-03: 5 mg via ORAL
  Filled 2016-07-03: qty 1

## 2016-07-03 MED ORDER — BUPIVACAINE HCL (PF) 0.25 % IJ SOLN
INTRAMUSCULAR | Status: AC
  Filled 2016-07-03: qty 30

## 2016-07-03 MED ORDER — CEFAZOLIN SODIUM-DEXTROSE 2-4 GM/100ML-% IV SOLN
INTRAVENOUS | Status: AC
  Filled 2016-07-03: qty 100

## 2016-07-03 MED ORDER — FENTANYL CITRATE (PF) 100 MCG/2ML IJ SOLN
INTRAMUSCULAR | Status: AC
  Filled 2016-07-03: qty 2

## 2016-07-03 MED ORDER — ONDANSETRON HCL 4 MG/2ML IJ SOLN: 4.0000 mg | Freq: Once | INTRAMUSCULAR | Status: DC | PRN

## 2016-07-03 MED ORDER — LIDOCAINE HCL (CARDIAC) 20 MG/ML IV SOLN
INTRAVENOUS | Status: DC | PRN
Start: 2016-07-03 — End: 2016-07-03
  Administered 2016-07-03: 40 mg via INTRAVENOUS

## 2016-07-03 MED ORDER — PROPOFOL 10 MG/ML IV BOLUS
INTRAVENOUS | Status: AC
  Filled 2016-07-03: qty 20

## 2016-07-03 MED ORDER — ROCURONIUM BROMIDE 10 MG/ML (PF) SYRINGE
PREFILLED_SYRINGE | INTRAVENOUS | Status: AC
  Filled 2016-07-03: qty 10

## 2016-07-03 MED ORDER — BUPIVACAINE HCL (PF) 0.25 % IJ SOLN
INTRAMUSCULAR | Status: DC | PRN
  Administered 2016-07-03: 1 mL

## 2016-07-03 MED ORDER — CANE MISC: 1.0000 | Freq: Every day | Status: DC

## 2016-07-03 MED ORDER — METHYLPREDNISOLONE ACETATE 80 MG/ML IJ SUSP
INTRAMUSCULAR | Status: AC
  Filled 2016-07-03: qty 1

## 2016-07-03 MED ORDER — LIDOCAINE-EPINEPHRINE 2 %-1:100000 IJ SOLN
INTRAMUSCULAR | Status: DC | PRN
  Administered 2016-07-03: 10 mL via INTRADERMAL

## 2016-07-03 MED ORDER — CELECOXIB 200 MG PO CAPS: 200.0000 mg | ORAL_CAPSULE | ORAL | Status: DC

## 2016-07-03 MED ORDER — 0.9 % SODIUM CHLORIDE (POUR BTL) OPTIME
TOPICAL | Status: DC | PRN
  Administered 2016-07-03: 1000 mL

## 2016-07-03 MED ORDER — KETOROLAC TROMETHAMINE 30 MG/ML IJ SOLN
INTRAMUSCULAR | Status: AC
  Filled 2016-07-03: qty 1

## 2016-07-03 MED ORDER — SODIUM CHLORIDE 0.9 % IR SOLN
Status: DC | PRN
  Administered 2016-07-03: 09:00:00

## 2016-07-03 MED ORDER — HYDROMORPHONE HCL 2 MG/ML IJ SOLN
0.2500 mg | INTRAMUSCULAR | Status: DC | PRN
  Administered 2016-07-03 (×3): 0.5 mg via INTRAVENOUS

## 2016-07-03 MED ORDER — HYDROMORPHONE HCL 2 MG/ML IJ SOLN
INTRAMUSCULAR | Status: AC
  Filled 2016-07-03: qty 1

## 2016-07-03 MED ORDER — SODIUM CHLORIDE 0.9 % IV SOLN: 250.0000 mL | INTRAVENOUS | Status: DC

## 2016-07-03 MED ORDER — ALBUTEROL SULFATE (2.5 MG/3ML) 0.083% IN NEBU: 2.5000 mg | INHALATION_SOLUTION | Freq: Four times a day (QID) | RESPIRATORY_TRACT | Status: DC | PRN

## 2016-07-03 MED ORDER — PHENYLEPHRINE 40 MCG/ML (10ML) SYRINGE FOR IV PUSH (FOR BLOOD PRESSURE SUPPORT)
PREFILLED_SYRINGE | INTRAVENOUS | Status: DC | PRN
  Administered 2016-07-03: 40 ug via INTRAVENOUS
  Administered 2016-07-03: 80 ug via INTRAVENOUS

## 2016-07-03 MED ORDER — POTASSIUM CHLORIDE IN NACL 20-0.9 MEQ/L-% IV SOLN
100.0000 mL/h | INTRAVENOUS | Status: DC
  Administered 2016-07-03: 100 mL/h via INTRAVENOUS

## 2016-07-03 MED ORDER — AMLODIPINE BESYLATE 10 MG PO TABS
10.0000 mg | ORAL_TABLET | Freq: Every day | ORAL | Status: DC
  Filled 2016-07-03: qty 1

## 2016-07-03 MED ORDER — ALPRAZOLAM 0.5 MG PO TABS: 0.5000 mg | ORAL_TABLET | Freq: Every day | ORAL | Status: DC | PRN

## 2016-07-03 MED ORDER — BUPIVACAINE LIPOSOME 1.3 % IJ SUSP
20.0000 mL | INTRAMUSCULAR | Status: AC
  Administered 2016-07-03: 20 mL
  Filled 2016-07-03: qty 20

## 2016-07-03 SURGICAL SUPPLY — 64 items
BAG DECANTER FOR FLEXI CONT (MISCELLANEOUS) ×3 IMPLANT
BENZOIN TINCTURE PRP APPL 2/3 (GAUZE/BANDAGES/DRESSINGS) IMPLANT
BIT DRILL NEURO 2X3.1 SFT TUCH (MISCELLANEOUS) ×1 IMPLANT
BLADE CLIPPER SURG (BLADE) IMPLANT
BLADE SURG 11 STRL SS (BLADE) ×3 IMPLANT
BUR ROUND FLUTED 5 RND (BURR) ×2 IMPLANT
BUR ROUND FLUTED 5MM RND (BURR) ×1
CANISTER SUCT 3000ML PPV (MISCELLANEOUS) ×6 IMPLANT
CHLORAPREP W/TINT 26ML (MISCELLANEOUS) ×3 IMPLANT
CLOSURE WOUND 1/2 X4 (GAUZE/BANDAGES/DRESSINGS)
CONT SPEC 4OZ CLIKSEAL STRL BL (MISCELLANEOUS) ×3 IMPLANT
DECANTER SPIKE VIAL GLASS SM (MISCELLANEOUS) ×3 IMPLANT
DERMABOND ADVANCED (GAUZE/BANDAGES/DRESSINGS) ×2
DERMABOND ADVANCED .7 DNX12 (GAUZE/BANDAGES/DRESSINGS) ×1 IMPLANT
DRAPE HALF SHEET 40X57 (DRAPES) ×3 IMPLANT
DRAPE INCISE IOBAN 66X45 STRL (DRAPES) ×3 IMPLANT
DRAPE MICROSCOPE LEICA (MISCELLANEOUS) ×3 IMPLANT
DRAPE POUCH INSTRU U-SHP 10X18 (DRAPES) ×3 IMPLANT
DRAPE SURG 17X23 STRL (DRAPES) ×3 IMPLANT
DRILL NEURO 2X3.1 SOFT TOUCH (MISCELLANEOUS) ×3
DRSG OPSITE POSTOP 4X6 (GAUZE/BANDAGES/DRESSINGS) ×3 IMPLANT
ELECT REM PT RETURN 9FT ADLT (ELECTROSURGICAL) ×3
ELECTRODE REM PT RTRN 9FT ADLT (ELECTROSURGICAL) ×1 IMPLANT
GAUZE SPONGE 4X4 12PLY STRL (GAUZE/BANDAGES/DRESSINGS) IMPLANT
GAUZE SPONGE 4X4 16PLY XRAY LF (GAUZE/BANDAGES/DRESSINGS) IMPLANT
GLOVE BIOGEL PI IND STRL 7.5 (GLOVE) ×1 IMPLANT
GLOVE BIOGEL PI INDICATOR 7.5 (GLOVE) ×2
GLOVE ECLIPSE 7.0 STRL STRAW (GLOVE) ×3 IMPLANT
GLOVE SS BIOGEL STRL SZ 7.5 (GLOVE) ×3 IMPLANT
GLOVE SUPERSENSE BIOGEL SZ 7.5 (GLOVE) ×6
GOWN STRL REUS W/ TWL LRG LVL3 (GOWN DISPOSABLE) ×3 IMPLANT
GOWN STRL REUS W/ TWL XL LVL3 (GOWN DISPOSABLE) IMPLANT
GOWN STRL REUS W/TWL LRG LVL3 (GOWN DISPOSABLE) ×6
GOWN STRL REUS W/TWL XL LVL3 (GOWN DISPOSABLE)
HEMOSTAT POWDER KIT SURGIFOAM (HEMOSTASIS) ×3 IMPLANT
KIT BASIN OR (CUSTOM PROCEDURE TRAY) ×3 IMPLANT
KIT ROOM TURNOVER OR (KITS) ×3 IMPLANT
NEEDLE HYPO 18GX1.5 BLUNT FILL (NEEDLE) ×3 IMPLANT
NEEDLE HYPO 21X1.5 SAFETY (NEEDLE) ×6 IMPLANT
NEEDLE HYPO 25X1 1.5 SAFETY (NEEDLE) ×3 IMPLANT
NEEDLE SPNL 18GX3.5 QUINCKE PK (NEEDLE) ×3 IMPLANT
NS IRRIG 1000ML POUR BTL (IV SOLUTION) ×3 IMPLANT
PACK LAMINECTOMY NEURO (CUSTOM PROCEDURE TRAY) ×3 IMPLANT
PACK UNIVERSAL I (CUSTOM PROCEDURE TRAY) ×3 IMPLANT
PAD ARMBOARD 7.5X6 YLW CONV (MISCELLANEOUS) ×9 IMPLANT
PATTIES SURGICAL .5X1.5 (GAUZE/BANDAGES/DRESSINGS) ×3 IMPLANT
RUBBERBAND STERILE (MISCELLANEOUS) ×6 IMPLANT
SPONGE NEURO XRAY DETECT 1X3 (DISPOSABLE) ×3 IMPLANT
SPONGE SURGIFOAM ABS GEL SZ50 (HEMOSTASIS) ×6 IMPLANT
STRIP CLOSURE SKIN 1/2X4 (GAUZE/BANDAGES/DRESSINGS) IMPLANT
SUT STRATAFIX MNCRL+ 3-0 PS-2 (SUTURE) ×1
SUT STRATAFIX MONOCRYL 3-0 (SUTURE) ×2
SUT VIC AB 0 CT1 18XCR BRD8 (SUTURE) ×1 IMPLANT
SUT VIC AB 0 CT1 8-18 (SUTURE) ×2
SUT VIC AB 2-0 CT1 18 (SUTURE) ×6 IMPLANT
SUT VIC AB 4-0 PS2 27 (SUTURE) IMPLANT
SUTURE STRATFX MNCRL+ 3-0 PS-2 (SUTURE) ×1 IMPLANT
SYR 30ML LL (SYRINGE) ×9 IMPLANT
SYR 5ML LL (SYRINGE) ×3 IMPLANT
TOWEL OR 17X24 6PK STRL BLUE (TOWEL DISPOSABLE) ×3 IMPLANT
TOWEL OR 17X26 10 PK STRL BLUE (TOWEL DISPOSABLE) ×3 IMPLANT
TUBE CONNECTING 12'X1/4 (SUCTIONS) ×1
TUBE CONNECTING 12X1/4 (SUCTIONS) ×2 IMPLANT
WATER STERILE IRR 1000ML POUR (IV SOLUTION) ×3 IMPLANT

## 2016-07-03 NOTE — Op Note (Signed)
07/03/2016  9:21 AM  PATIENT:  Meredith Church  60 y.o. 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  She has intractable leg pain and a foot drop.  I recommended the above operation. Patient understood the risks, benefits, and alternatives and potential outcomes and wished to proceed.  PROCEDURE DETAILS: After smooth induction of general endotracheal anesthesia the patient was turned prone on the operating table on a Wilson frame. The skin of the lumbar region was clipped of hair. It was wiped down with alcohol. The patient was then prepped and draped in usual sterile fashion.   The subcutaneous tissues of the midline from approximately L4 to L5 was infiltrated with Marcaine. The skin in this area was opened sharply. The soft tissues were dissected with monopolar cautery. Subperiosteal dissection was carried out along the sides of the spinous process and the laminar surfaces to the medial edge of the facet joints from L4 to L5. The spinous process were removed with rongeurs. The laminae were then thinned with a high-speed bur. The remaining lamina was resected with a Kerrison punch. Thickened ligamentum flavum was separated from the dura and resected with a Kerrison rongeur. The operating microscope was then draped and brought into the operative field to provide lighting and magnification.  Using microsurgical technique the thecal sac was further freed from the hypertrophied ligament in the lateral recesses.  Lateral recess decompression was completed. Decompression was carried out laterally to the foramina. The  foramina were palpated and found to be without residual stenosis.   I retracted the thecal sac medially to identify the disc spaced. I sharply incised the PLL and annulus.  There was no significant herniated disc material.  The nerves were clearly adequately decompressed when palpated.  I irrigated vigorously with bacitracin saline. There was excellent hemostasis. I injected a 3cc mixture of depomedrol, marcaine, and toradol in the epidural space. I placed vancomycin powder in the depths of the wounds.The wound was then closed in routine anatomic layers with interrupted vicryl suture. I injected exparel in the paraspinous muscles. The skin was closed with a running Monocryl strata fix suture. It was then sealed with Dermabond. The patient was turned to the supine position and allowed to awaken from anesthesia.   PATIENT DISPOSITION:  PACU - hemodynamically stable.   Delay start of Pharmacological VTE agent (>24hrs) due to surgical blood loss or risk of bleeding:  yes

## 2016-07-03 NOTE — H&P (Signed)
CC:  No chief complaint on file.   HPI: Meredith Church is a 60 y.o. female with medically intractable low back pain radiating into her legs with right foot drop.  She presents for L4-5 laminectomy with discectomy.  She denies changes since clinic.  PMH: Past Medical History:  Diagnosis Date  . Anxiety Dx 2007  . Eczema   . Hyperlipidemia Dx 2010  . Hypertension Dx 2010  . Numbness and tingling of lower extremity     PSH: Past Surgical History:  Procedure Laterality Date  . ABDOMINAL HYSTERECTOMY    . LIPOMA EXCISION    . SALIVARY GLAND SURGERY    . TIBIA FRACTURE SURGERY     right    SH: Social History  Substance Use Topics  . Smoking status: Current Every Day Smoker    Packs/day: 0.50    Types: Cigarettes    Last attempt to quit: 07/18/2014  . Smokeless tobacco: Never Used     Comment: Smokes socially.  . Alcohol use No    MEDS: Prior to Admission medications   Medication Sig Start Date End Date Taking? Authorizing Provider  albuterol (PROVENTIL HFA;VENTOLIN HFA) 108 (90 BASE) MCG/ACT inhaler Inhale 2 puffs into the lungs every 6 (six) hours as needed for wheezing or shortness of breath. 12/18/14  Yes Micheline Chapman, NP  ALPRAZolam Duanne Moron) 0.5 MG tablet Take 1 tablet (0.5 mg total) by mouth daily as needed for anxiety or sleep. 06/03/16  Yes Josalyn Funches, MD  amLODipine (NORVASC) 10 MG tablet Take 1 tablet (10 mg total) by mouth daily. 08/29/15  Yes Josalyn Funches, MD  ammonium lactate (LAC-HYDRIN) 12 % cream Apply topically as needed for dry skin. Patient taking differently: Apply 1 g topically 2 (two) times daily as needed (for ezcema.).  05/30/15  Yes Josalyn Funches, MD  Biotin 1000 MCG tablet Take 2,000 mcg by mouth daily.   Yes Historical Provider, MD  meloxicam (MOBIC) 7.5 MG tablet Take 1 tablet (7.5 mg total) by mouth daily. 06/03/16  Yes Josalyn Funches, MD  metoprolol (LOPRESSOR) 50 MG tablet TAKE 1 TABLET BY MOUTH 2 TIMES DAILY 05/27/16  Yes Boykin Nearing, MD  Misc. Devices (CANE) MISC 1 each by Does not apply route daily. 11/02/15  Yes Josalyn Funches, MD  triamcinolone ointment (KENALOG) 0.5 % Apply 1 application topically 2 (two) times daily. Apply to hands Patient taking differently: Apply 1 application topically 2 (two) times daily as needed (for ezcema.). Apply to hands 11/02/15  Yes Boykin Nearing, MD  amitriptyline (ELAVIL) 75 MG tablet Start with 1/2 tablet, 37.5 mg nightly for two weeks, then 75 mg nightly Patient taking differently: Take 75 mg by mouth at bedtime as needed for sleep.  06/03/16   Josalyn Funches, MD  gabapentin (NEURONTIN) 300 MG capsule Take 1 capsule (300 mg total) by mouth at bedtime. Patient not taking: Reported on 06/30/2016 02/14/16   Boykin Nearing, MD  L-LYSINE PO Take 1 tablet by mouth daily as needed (to help control ezcema.).     Historical Provider, MD    ALLERGY: Allergies  Allergen Reactions  . Other Other (See Comments)     Nickel "breaks my skin out"    ROS: ROS  NEUROLOGIC EXAM: Awake, alert, oriented Memory and concentration grossly intact Speech fluent, appropriate CN grossly intact Motor exam: Upper Extremities Deltoid Bicep Tricep Grip  Right 5/5 5/5 5/5 5/5  Left 5/5 5/5 5/5 5/5   Lower Extremity IP Quad PF DF EHL  Right  5/5 5/5 5/5 2/5 2/5  Left 5/5 5/5 5/5 5/5 5/5   Sensation grossly intact to LT  IMAGING: No new imaging.  IMPRESSION: - 60 y.o. female with lumbar stenosis and a disc herniation at L4-5.  She has deficits documented above.  PLAN: - L4-5 laminectomy with discectomy - All questions answered

## 2016-07-03 NOTE — Anesthesia Postprocedure Evaluation (Signed)
Anesthesia Post Note  Patient: Meredith Church  Procedure(s) Performed: Procedure(s) (LRB): LUMBAR FOUR - LUMBAR FIVE LAMINECTOMY/DISKECTOMY (N/A)  Patient location during evaluation: PACU Anesthesia Type: General Level of consciousness: awake, awake and alert and oriented Pain management: pain level controlled Vital Signs Assessment: post-procedure vital signs reviewed and stable Respiratory status: spontaneous breathing, nonlabored ventilation and respiratory function stable Cardiovascular status: blood pressure returned to baseline Anesthetic complications: no    Last Vitals:  Vitals:   07/03/16 1644 07/03/16 2000  BP: (!) 142/79 106/74  Pulse: 69 80  Resp: 20 18  Temp: 36.5 C 36.6 C    Last Pain:  Vitals:   07/03/16 2000  TempSrc: Oral  PainSc:     LLE Motor Response: Purposeful movement (07/03/16 2150) LLE Sensation: Full sensation (07/03/16 2150) RLE Motor Response: Purposeful movement (07/03/16 2150) RLE Sensation: Full sensation (07/03/16 2150)      Roshard Rezabek COKER

## 2016-07-03 NOTE — Evaluation (Signed)
Occupational Therapy Evaluation Patient Details Name: Meredith Church MRN: [id number] DOB: 1956-05-28 Today's Date: 07/03/2016    History of Present Illness Pt is a 60 y.o. female s/p L4-5 laminectomy for decompression. PMHx: Anxiety, Hyperlipidemia, HTN.    Clinical Impression   Pt reports she was independent with ADL PTA. Currently pt overall min guard for ADL and functional mobility. Pt with LOB x1 during functional mobility due to LE weakness/knee buckling; pt able to self correct without physical assist. Began back, safety, and ADL education with pt. Pt planning to d/c home alone with intermittent supervision from a friend. Pt would benefit from continued skilled OT to address established goals.    Follow Up Recommendations  No OT follow up;Supervision/Assistance - 24 hour (initially)    Equipment Recommendations  3 in 1 bedside comode    Recommendations for Other Services PT consult     Precautions / Restrictions Precautions Precautions: Back Precaution Booklet Issued: Yes (comment) Precaution Comments: Educated pt on back precautions and log roll technique. Restrictions Weight Bearing Restrictions: No      Mobility Bed Mobility Overal bed mobility: Needs Assistance Bed Mobility: Rolling;Sidelying to Sit;Sit to Sidelying Rolling: Min guard Sidelying to sit: Min guard     Sit to sidelying: Min guard General bed mobility comments: Cues for log roll technique. HOB flat with use of bed rail.  Transfers Overall transfer level: Needs assistance Equipment used: None Transfers: Sit to/from Stand Sit to Stand: Min guard         General transfer comment: Min guard for safety. Sit<>stand from EOB x 1.    Balance Overall balance assessment: Needs assistance Sitting-balance support: Feet supported;No upper extremity supported Sitting balance-Leahy Scale: Good     Standing balance support: No upper extremity supported;During functional activity Standing  balance-Leahy Scale: Fair                              ADL Overall ADL's : Needs assistance/impaired Eating/Feeding: Independent;Sitting   Grooming: Min guard;Standing Grooming Details (indicate cue type and reason): Educated pt on use of 2 cups for oral care. Upper Body Bathing: Set up;Supervision/ safety;Sitting   Lower Body Bathing: Min guard;Sit to/from stand   Upper Body Dressing : Set up;Supervision/safety;Sitting   Lower Body Dressing: Min guard;Sit to/from stand Lower Body Dressing Details (indicate cue type and reason): Pt able to cross foot over opposite knee. Educated on compensatory strategies for LB ADL. Toilet Transfer: Min guard;Ambulation;BSC Toilet Transfer Details (indicate cue type and reason): Simulated by sit to stand from EOB with functional mobility in room.   Toileting - Clothing Manipulation Details (indicate cue type and reason): Educated pt on proper technique for peri care without twisting.     Functional mobility during ADLs: Min guard General ADL Comments: Pt with 1 instance of LE buckling during functional mobility. No physical assist required to correct LOB.     Vision     Perception     Praxis      Pertinent Vitals/Pain Pain Assessment: No/denies pain     Hand Dominance     Extremity/Trunk Assessment Upper Extremity Assessment Upper Extremity Assessment: Overall WFL for tasks assessed   Lower Extremity Assessment Lower Extremity Assessment: Defer to PT evaluation   Cervical / Trunk Assessment Cervical / Trunk Assessment: Other exceptions Cervical / Trunk Exceptions: s/p spinal sx   Communication Communication Communication: No difficulties   Cognition Arousal/Alertness: Awake/alert Behavior During Therapy: WFL for tasks  assessed/performed Overall Cognitive Status: Within Functional Limits for tasks assessed                     General Comments       Exercises       Shoulder Instructions      Home  Living Family/patient expects to be discharged to:: Private residence Living Arrangements: Alone Available Help at Discharge: Friend(s);Available PRN/intermittently Type of Home: House Home Access: Stairs to enter [employer] of Steps: 3   Home Layout: One level     Bathroom Shower/Tub: Tub/shower unit Shower/tub characteristics: Curtain Biochemist, clinical: Handicapped height     Home Equipment: Grab bars - tub/shower          Prior Functioning/Environment Level of Independence: Independent                 OT Problem List: Impaired balance (sitting and/or standing);Decreased knowledge of use of DME or AE;Decreased knowledge of precautions   OT Treatment/Interventions: Self-care/ADL training;DME and/or AE instruction;Therapeutic activities;Patient/family education;Balance training    OT Goals(Current goals can be found in the care plan section) Acute Rehab OT Goals Patient Stated Goal: return home OT Goal Formulation: With patient Time For Goal Achievement: 07/17/16 Potential to Achieve Goals: Good ADL Goals Pt Will Transfer to Toilet: with supervision;ambulating;bedside commode Pt Will Perform Toileting - Clothing Manipulation and hygiene: with supervision;sit to/from stand Pt Will Perform Tub/Shower Transfer: Tub transfer;with supervision;ambulating;3 in 1 Additional ADL Goal #1: Pt will independently verbally recall 3/3 back precautions and maintain throughout ADL.  OT Frequency: Min 2X/week   Barriers to D/C: Decreased caregiver support  lives alone       Co-evaluation              End of Session Nurse Communication: Mobility status  Activity Tolerance: Patient tolerated treatment well Patient left: in bed;with call bell/phone within reach   Time: [id number] OT Time Calculation (min): 16 min Charges:  OT General Charges $OT Visit: 1 Procedure OT Evaluation $OT Eval Moderate Complexity: 1 Procedure G-Codes: OT G-codes **NOT FOR INPATIENT  CLASS** Functional Assessment Tool Used: Clinical judgement Functional Limitation: Self care Self Care Current Status [id number]): At least 1 percent but less than 20 percent impaired, limited or restricted Self Care Goal Status [id number]): At least 1 percent but less than 20 percent impaired, limited or restricted   Binnie Kand M.S., OTR/L Pager: [phone]  07/03/2016, 5:02 PM

## 2016-07-03 NOTE — Progress Notes (Addendum)
Pt arrived from PACU to 3C02. Alert at this time. Safety precautions and orders reviewed with patient. Family at bedside. ICS encourage. VSS. Stabled. Patient ambulated from wheelchair to bed at this time. Will continue to monitor.   Ave Filter, RN

## 2016-07-03 NOTE — Anesthesia Preprocedure Evaluation (Addendum)
Anesthesia Evaluation  Patient identified by MRN, date of birth, ID band Patient awake    Reviewed: Allergy & Precautions, NPO status , Patient's Chart, lab work & pertinent test results  Airway Mallampati: II  TM Distance: >3 FB Neck ROM: Full    Dental  (+) Edentulous Upper, Partial Lower   Pulmonary Current Smoker,    breath sounds clear to auscultation       Cardiovascular hypertension, Pt. on medications and Pt. on home beta blockers  Rhythm:Regular Rate:Normal     Neuro/Psych  Neuromuscular disease    GI/Hepatic   Endo/Other    Renal/GU      Musculoskeletal   Abdominal   Peds  Hematology   Anesthesia Other Findings   Reproductive/Obstetrics                          Anesthesia Physical Anesthesia Plan  ASA: III  Anesthesia Plan: General   Post-op Pain Management:    Induction: Intravenous  Airway Management Planned: Oral ETT  Additional Equipment:   Intra-op Plan:   Post-operative Plan: Extubation in OR  Informed Consent: I have reviewed the patients History and Physical, chart, labs and discussed the procedure including the risks, benefits and alternatives for the proposed anesthesia with the patient or authorized representative who has indicated his/her understanding and acceptance.   Dental advisory given  Plan Discussed with: CRNA, Anesthesiologist and Surgeon  Anesthesia Plan Comments:        Anesthesia Quick Evaluation

## 2016-07-03 NOTE — Anesthesia Procedure Notes (Signed)
Procedure Name: Intubation Date/Time: 07/03/2016 7:43 AM Performed by: Carney Living Pre-anesthesia Checklist: Patient identified, Emergency Drugs available, Suction available, Patient being monitored and Timeout performed Patient Re-evaluated:Patient Re-evaluated prior to inductionOxygen Delivery Method: Circle system utilized Preoxygenation: Pre-oxygenation with 100% oxygen Intubation Type: IV induction Ventilation: Mask ventilation without difficulty and Oral airway inserted - appropriate to patient size Laryngoscope Size: Sabra Heck and 2 Grade View: Grade I Tube type: Oral Tube size: 7.0 mm Number of attempts: 1 Airway Equipment and Method: Stylet Placement Confirmation: ETT inserted through vocal cords under direct vision,  positive ETCO2 and breath sounds checked- equal and bilateral Secured at: 22 cm Tube secured with: Tape Dental Injury: Teeth and Oropharynx as per pre-operative assessment  Comments: Intubation performed by Cristobal Goldmann, SRNA

## 2016-07-03 NOTE — Evaluation (Signed)
Physical Therapy Evaluation Patient Details Name: Meredith Church MRN: [id number] DOB: [birth year] Today's Date: 07/03/2016   History of Present Illness  Pt is a 60 y.o. female s/p L4-5 laminectomy for decompression. PMHx: Anxiety, Hyperlipidemia, HTN.   Clinical Impression  Pt admitted with/for lumbar laminectomy.  Pt currently limited functionally due to the problems listed below.  (see problems list.)  Pt will benefit from PT to maximize function and safety to be able to get home safely with available assist of family .     Follow Up Recommendations No PT follow up    Equipment Recommendations  None recommended by PT    Recommendations for Other Services       Precautions / Restrictions Precautions Precautions: Back Precaution Booklet Issued: Yes (comment) Precaution Comments: Educated pt on back precautions and log roll technique. Restrictions Weight Bearing Restrictions: No      Mobility  Bed Mobility Overal bed mobility: Needs Assistance Bed Mobility: Rolling;Sidelying to Sit;Sit to Sidelying Rolling: Min guard Sidelying to sit: Min guard     Sit to sidelying: Min guard General bed mobility comments: Cues for log roll technique. HOB flat with use of bed rail.  Transfers Overall transfer level: Needs assistance Equipment used: None Transfers: Sit to/from Stand Sit to Stand: Min guard         General transfer comment: Min guard for safety. Sit<>stand from EOB x 1.  Ambulation/Gait Ambulation/Gait assistance: Supervision Ambulation Distance (Feet): 150 Feet Assistive device: Rolling walker (2 wheeled) Gait Pattern/deviations: Step-through pattern   Gait velocity interpretation: Below normal speed for age/gender General Gait Details: steady, but guarded  Stairs            Wheelchair Mobility    Modified Rankin (Stroke Patients Only)       Balance Overall balance assessment: Needs assistance Sitting-balance support: Feet supported;No upper  extremity supported Sitting balance-Leahy Scale: Good     Standing balance support: No upper extremity supported;During functional activity Standing balance-Leahy Scale: Fair                               Pertinent Vitals/Pain Pain Assessment: Faces Faces Pain Scale: Hurts a little bit Pain Location: back Pain Descriptors / Indicators: Sore Pain Intervention(s): Monitored during session;Repositioned    Home Living Family/patient expects to be discharged to:: Private residence Living Arrangements: Alone Available Help at Discharge: Friend(s);Available PRN/intermittently Type of Home: House Home Access: Stairs to enter   [employer] of Steps: 3 Home Layout: One level Home Equipment: Grab bars - tub/shower      Prior Function Level of Independence: Independent               Hand Dominance        Extremity/Trunk Assessment   Upper Extremity Assessment: Defer to OT evaluation           Lower Extremity Assessment: Overall WFL for tasks assessed      Cervical / Trunk Assessment: Other exceptions  Communication   Communication: No difficulties  Cognition Arousal/Alertness: Awake/alert Behavior During Therapy: WFL for tasks assessed/performed Overall Cognitive Status: Within Functional Limits for tasks assessed                      General Comments General comments (skin integrity, edema, etc.): pt and daughter instructed in back care/prec, log roll/transitions, lifting restrictions, progression of activity.    Exercises     Assessment/Plan  PT Assessment Patient needs continued PT services  PT Problem List Decreased activity tolerance;Decreased mobility;Decreased knowledge of precautions;Pain          PT Treatment Interventions Gait training;Functional mobility training;Therapeutic activities;Stair training;Patient/family education    PT Goals (Current goals can be found in the Care Plan section)  Acute Rehab PT  Goals Patient Stated Goal: return home PT Goal Formulation: With patient Time For Goal Achievement: 07/10/16 Potential to Achieve Goals: Good    Frequency Min 5X/week   Barriers to discharge        Co-evaluation               End of Session   Activity Tolerance: Patient tolerated treatment well Patient left: in bed;with call bell/phone within reach;with family/visitor present Nurse Communication: Mobility status    Functional Assessment Tool Used: clinical judgement Functional Limitation: Mobility: Walking and moving around Mobility: Walking and Moving Around Current Status [id number]): At least 1 percent but less than 20 percent impaired, limited or restricted Mobility: Walking and Moving Around Goal Status [phone]): 0 percent impaired, limited or restricted    Time: 1640-1715 PT Time Calculation (min) (ACUTE ONLY): 35 min   Charges:   PT Evaluation $PT Eval Low Complexity: 1 Procedure PT Treatments $Gait Training: 8-22 mins   PT G Codes:   PT G-Codes **NOT FOR INPATIENT CLASS** Functional Assessment Tool Used: clinical judgement Functional Limitation: Mobility: Walking and moving around Mobility: Walking and Moving Around Current Status [id number]): At least 1 percent but less than 20 percent impaired, limited or restricted Mobility: Walking and Moving Around Goal Status [phone]): 0 percent impaired, limited or restricted    Adysen Raphael, Tessie Fass 07/03/2016, 5:34 PM 07/03/2016  Donnella Sham, PT [phone]  (pager)

## 2016-07-03 NOTE — Transfer of Care (Signed)
Immediate Anesthesia Transfer of Care Note  Patient: Meredith Church  Procedure(s) Performed: Procedure(s) with comments: LUMBAR FOUR - LUMBAR FIVE LAMINECTOMY/DISKECTOMY (N/A) - LUMBAR FOUR - LUMBAR FIVE LAMINECTOMY/DISKECTOMY  Patient Location: PACU  Anesthesia Type:General  Level of Consciousness: awake, alert , oriented and patient cooperative  Airway & Oxygen Therapy: Patient Spontanous Breathing and Patient connected to nasal cannula oxygen  Post-op Assessment: Report given to RN, Post -op Vital signs reviewed and stable and Patient moving all extremities X 4  Post vital signs: Reviewed and stable  Last Vitals:  Vitals:   07/03/16 0620  BP: (!) 123/92  Pulse: 82  Resp: 20  Temp: 36.9 C    Last Pain:  Vitals:   07/03/16 0620  TempSrc: Oral  PainSc:       Patients Stated Pain Goal: 3 ([id number])  Complications: No apparent anesthesia complications

## 2016-07-04 ENCOUNTER — Encounter (HOSPITAL_COMMUNITY): Payer: Self-pay | Admitting: Neurological Surgery

## 2016-07-04 DIAGNOSIS — M5116 Intervertebral disc disorders with radiculopathy, lumbar region: Secondary | ICD-10-CM | POA: Diagnosis not present

## 2016-07-04 MED ORDER — OXYCODONE HCL 5 MG PO TABS: 5.0000 mg | ORAL_TABLET | ORAL | 0 refills | Status: DC | PRN

## 2016-07-04 MED ORDER — GABAPENTIN 300 MG PO CAPS: 300.0000 mg | ORAL_CAPSULE | Freq: Three times a day (TID) | ORAL | 0 refills | Status: DC

## 2016-07-04 MED ORDER — METHOCARBAMOL 750 MG PO TABS: 750.0000 mg | ORAL_TABLET | Freq: Four times a day (QID) | ORAL | 0 refills | Status: DC

## 2016-07-04 NOTE — Discharge Summary (Signed)
   Physician Discharge Summary  Patient ID: Meredith Church MRN: [id number] DOB/AGE: 1956/05/26 60 y.o.  Admit date: 07/03/2016 Discharge date: 07/04/2016  Admission Diagnoses: Lumbar spondylosis with radiculopathy  Discharge Diagnoses: Same Active Problems:   Lumbosacral spondylosis with radiculopathy   Discharged Condition: Stable  Hospital Course:  [name] is a 60 y.o. female electively admitted after uncomplicated lumbar decompression. She was at baseline postop, with improvement in leg pain. She was ambulating well, tolerating diet.  Treatments: Surgery - Lumbar decompression  Discharge Exam: Blood pressure 117/80, pulse 77, temperature 98 F (36.7 C), temperature source Oral, resp. rate 18, weight 79.8 kg (176 lb), SpO2 94 %. Awake, alert, oriented Speech fluent, appropriate CN grossly intact 5/5 BUE/BLE Wound c/d/i  Disposition: Home     Medication List    TAKE these medications   albuterol 108 (90 Base) MCG/ACT inhaler Commonly known as:  PROVENTIL HFA;VENTOLIN HFA Inhale 2 puffs into the lungs every 6 (six) hours as needed for wheezing or shortness of breath.   ALPRAZolam 0.5 MG tablet Commonly known as:  XANAX Take 1 tablet (0.5 mg total) by mouth daily as needed for anxiety or sleep.   amitriptyline 75 MG tablet Commonly known as:  ELAVIL Start with 1/2 tablet, 37.5 mg nightly for two weeks, then 75 mg nightly What changed:  how much to take  how to take this  when to take this  reasons to take this  additional instructions   amLODipine 10 MG tablet Commonly known as:  NORVASC Take 1 tablet (10 mg total) by mouth daily.   ammonium lactate 12 % cream Commonly known as:  LAC-HYDRIN Apply topically as needed for dry skin. What changed:  how much to take  when to take this  reasons to take this   Biotin 1000 MCG tablet Take 2,000 mcg by mouth daily.   Cane Misc 1 each by Does not apply route daily.   gabapentin 300  MG capsule Commonly known as:  NEURONTIN Take 1 capsule (300 mg total) by mouth 3 (three) times daily.   L-LYSINE PO Take 1 tablet by mouth daily as needed (to help control ezcema.).   meloxicam 7.5 MG tablet Commonly known as:  MOBIC Take 1 tablet (7.5 mg total) by mouth daily.   methocarbamol 750 MG tablet Commonly known as:  ROBAXIN Take 1 tablet (750 mg total) by mouth 4 (four) times daily.   metoprolol 50 MG tablet Commonly known as:  LOPRESSOR TAKE 1 TABLET BY MOUTH 2 TIMES DAILY   oxyCODONE 5 MG immediate release tablet Commonly known as:  Oxy IR/ROXICODONE Take 1-2 tablets (5-10 mg total) by mouth every 3 (three) hours as needed for breakthrough pain.      Follow-up Information    [name] [name], MD Follow up in 3 week(s).   Specialty:  Neurosurgery Contact information: [address] [place] [address] [phone]           Signed: Jairo Ben 07/04/2016, 8:27 AM

## 2016-07-04 NOTE — Progress Notes (Signed)
Pt doing well. Pt given D/C instructions with Rx's, verbal understanding was provided. Pt's incision is clean and dry with no sign of infection. Pt's IV was removed prior to D/C. Pt D/C'd home via wheelchair @ 1455 per MD order. Pt is stable @ D/C and has no other needs at this time. Holli Humbles, RN

## 2016-07-04 NOTE — Progress Notes (Signed)
Occupational Therapy Treatment Patient Details Name: Meredith Church MRN: [id number] DOB: [birth year] Today's Date: 07/04/2016    History of present illness Pt is a 60 y.o. female s/p L4-5 laminectomy for decompression. PMHx: Anxiety, Hyperlipidemia, HTN.    OT comments  Pt able to meet all goals adequately for d/c from acute OT services. Pt performed simulated tub transfer and toilet transfer with supervision for safety. Educated on use of 3 in 1 over toilet and in tub as a seat. Pt able to recall 2/3 back precautions and able to demo good log roll technique. D/c plan remains appropriate. No further acute OT needs identified; signing off at this time. Please re-consult if needs change.    Follow Up Recommendations  No OT follow up;Supervision/Assistance - 24 hour (initially)    Equipment Recommendations  3 in 1 bedside comode    Recommendations for Other Services      Precautions / Restrictions Precautions Precautions: Back Precaution Comments: Pt able to recall 2/3 back precautions. Reviewed all precautions and log roll technique with pt. Restrictions Weight Bearing Restrictions: No       Mobility Bed Mobility Overal bed mobility: Needs Assistance Bed Mobility: Rolling;Sidelying to Sit Rolling: Supervision Sidelying to sit: Supervision       General bed mobility comments: HOB flat without use of bed rail. Pt with good log roll technique and no physical assist required.  Transfers Overall transfer level: Needs assistance Equipment used: None Transfers: Sit to/from Stand Sit to Stand: Supervision         General transfer comment: Supervision for safety; no physical assist needed. No LOB or unsteadness in standing. Cues for hand placement.    Balance Overall balance assessment: Needs assistance   Sitting balance-Leahy Scale: Good       Standing balance-Leahy Scale: Fair                     ADL Overall ADL's : Needs assistance/impaired                         Toilet Transfer: Supervision/safety;Ambulation;BSC       Tub/ Shower Transfer: Supervision/safety;Tub transfer;Ambulation;3 in 1 Tub/Shower Transfer Details (indicate cue type and reason): Educated pt on tub transfer technique and use of 3 in 1. Pt able to return demo simulated technique with supervision for safety. Provided handout for use of 3 in 1 in tub. Functional mobility during ADLs: Supervision/safety General ADL Comments: Reviewed maintaining back precautions during functional activities, log roll technique, supervision for safety with tub transfers initially for fall prevention, frequent movement throughout the day.      Vision                     Perception     Praxis      Cognition   Behavior During Therapy: WFL for tasks assessed/performed Overall Cognitive Status: Within Functional Limits for tasks assessed                       Extremity/Trunk Assessment               Exercises     Shoulder Instructions       General Comments      Pertinent Vitals/ Pain       Pain Assessment: Faces Faces Pain Scale: Hurts a little bit Pain Location: back Pain Descriptors / Indicators: Sore Pain Intervention(s): Monitored during session;Repositioned  Home Living  Prior Functioning/Environment              Frequency           Progress Toward Goals  OT Goals(current goals can now be found in the care plan section)  Progress towards OT goals: Goals met/education completed, patient discharged from OT  Acute Rehab OT Goals Patient Stated Goal: return home OT Goal Formulation: All assessment and education complete, DC therapy  Plan All goals met and education completed, patient discharged from OT services    Co-evaluation                 End of Session     Activity Tolerance Patient tolerated treatment well   Patient Left in chair;with call  bell/phone within reach;with nursing/sitter in room   Nurse Communication Mobility status;Other (comment) (needs 3 in 1 )        Time: 8264-1583 OT Time Calculation (min): 15 min  Charges: OT General Charges $OT Visit: 1 Procedure OT Treatments $Self Care/Home Management : 8-22 mins  Binnie Kand M.S., OTR/L Pager: [phone]  07/04/2016, 8:23 AM

## 2016-07-04 NOTE — Progress Notes (Signed)
Physical Therapy Treatment Patient Details Name: Meredith Church MRN: [id number] DOB: [birth year] Today's Date: 07/04/2016    History of Present Illness Pt is a 60 y.o. female s/p L4-5 laminectomy for decompression. PMHx: Anxiety, Hyperlipidemia, HTN.     PT Comments    Patient seen for mobility progression. Mobilizing well. Performed stair negotiation, increased ambulation without device and reviewed education [id number] and care transfers. Feel patient is safe for d/c home.  Follow Up Recommendations  No PT follow up     Equipment Recommendations  None recommended by PT    Recommendations for Other Services       Precautions / Restrictions Precautions Precautions: Back Precaution Comments: Pt able to recall 2/3 back precautions. Reviewed all precautions and log roll technique with pt. Restrictions Weight Bearing Restrictions: No    Mobility  Bed Mobility Overal bed mobility: Needs Assistance Bed Mobility: Rolling;Sidelying to Sit Rolling: Supervision Sidelying to sit: Supervision       General bed mobility comments: HOB flat without use of bed rail. Pt with good log roll technique and no physical assist required.  Transfers Overall transfer level: Needs assistance Equipment used: None Transfers: Sit to/from Stand Sit to Stand: Supervision         General transfer comment: Supervision for safety; no physical assist needed. No LOB or unsteadness in standing. Cues for hand placement.  Ambulation/Gait Ambulation/Gait assistance: Supervision Ambulation Distance (Feet): 380 Feet Assistive device: None Gait Pattern/deviations: Step-through pattern Gait velocity: improved with cues   General Gait Details: VCs for increased cadence   Stairs Stairs: Yes Stairs assistance: Supervision Stair Management: One rail Right Number of Stairs: 10 General stair comments: VCs for sequencing, no physical assist required  Wheelchair Mobility    Modified Rankin  (Stroke Patients Only)       Balance Overall balance assessment: Needs assistance Sitting-balance support: Feet supported Sitting balance-Leahy Scale: Good     Standing balance support: No upper extremity supported Standing balance-Leahy Scale: Fair                      Cognition Arousal/Alertness: Awake/alert Behavior During Therapy: WFL for tasks assessed/performed Overall Cognitive Status: Within Functional Limits for tasks assessed                      Exercises      General Comments General comments (skin integrity, edema, etc.): educated re car transfers and mobility expectations      Pertinent Vitals/Pain Pain Assessment: 0-10 Pain Score: 3  Faces Pain Scale: Hurts a little bit Pain Location: back  Pain Descriptors / Indicators: Sore Pain Intervention(s): Monitored during session    Home Living                      Prior Function            PT Goals (current goals can now be found in the care plan section) Acute Rehab PT Goals Patient Stated Goal: return home PT Goal Formulation: With patient Time For Goal Achievement: 07/10/16 Potential to Achieve Goals: Good Progress towards PT goals: Progressing toward goals    Frequency    Min 5X/week      PT Plan Current plan remains appropriate    Co-evaluation             End of Session Equipment Utilized During Treatment: Gait belt Activity Tolerance: Patient tolerated treatment well Patient left: in chair;with call bell/phone within reach  Time: 0821-0840 PT Time Calculation (min) (ACUTE ONLY): 19 min  Charges:  $Gait Training: 8-22 mins                    G CodesDuncan Dull 07/12/16, 9:14 AM Alben Deeds, PT DPT  [phone]

## 2016-07-28 ENCOUNTER — Other Ambulatory Visit: Payer: Self-pay | Admitting: Family Medicine

## 2016-07-28 DIAGNOSIS — I10 Essential (primary) hypertension: Secondary | ICD-10-CM

## 2016-07-28 MED FILL — AMLODIPINE BESYLATE 10 MG T: 10 | 30 days supply | Qty: 30 | Fill #0

## 2016-08-15 ENCOUNTER — Ambulatory Visit: Payer: Medicaid Other | Attending: Family Medicine | Admitting: Family Medicine

## 2016-08-15 ENCOUNTER — Encounter: Payer: Self-pay | Admitting: Licensed Clinical Social Worker

## 2016-08-15 ENCOUNTER — Encounter: Payer: Self-pay | Admitting: Family Medicine

## 2016-08-15 ENCOUNTER — Other Ambulatory Visit: Payer: Self-pay | Admitting: Family Medicine

## 2016-08-15 VITALS — BP 131/74 | HR 91 | Temp 98.0°F | Ht 64.0 in | Wt 178.4 lb

## 2016-08-15 DIAGNOSIS — F17200 Nicotine dependence, unspecified, uncomplicated: Secondary | ICD-10-CM

## 2016-08-15 DIAGNOSIS — F17210 Nicotine dependence, cigarettes, uncomplicated: Secondary | ICD-10-CM | POA: Diagnosis not present

## 2016-08-15 DIAGNOSIS — M5432 Sciatica, left side: Secondary | ICD-10-CM | POA: Diagnosis not present

## 2016-08-15 DIAGNOSIS — Z23 Encounter for immunization: Secondary | ICD-10-CM | POA: Diagnosis not present

## 2016-08-15 DIAGNOSIS — F411 Generalized anxiety disorder: Secondary | ICD-10-CM

## 2016-08-15 DIAGNOSIS — R05 Cough: Secondary | ICD-10-CM | POA: Insufficient documentation

## 2016-08-15 DIAGNOSIS — Z79899 Other long term (current) drug therapy: Secondary | ICD-10-CM | POA: Diagnosis not present

## 2016-08-15 DIAGNOSIS — M5116 Intervertebral disc disorders with radiculopathy, lumbar region: Secondary | ICD-10-CM | POA: Diagnosis not present

## 2016-08-15 DIAGNOSIS — M4727 Other spondylosis with radiculopathy, lumbosacral region: Secondary | ICD-10-CM

## 2016-08-15 DIAGNOSIS — Z114 Encounter for screening for human immunodeficiency virus [HIV]: Secondary | ICD-10-CM

## 2016-08-15 DIAGNOSIS — Z1159 Encounter for screening for other viral diseases: Secondary | ICD-10-CM

## 2016-08-15 MED ORDER — ALPRAZOLAM 0.5 MG PO TABS: 0.5000 mg | ORAL_TABLET | Freq: Every day | ORAL | 2 refills | Status: DC | PRN

## 2016-08-15 MED ORDER — ZOSTER VACCINE LIVE 19400 UNT/0.65ML ~~LOC~~ SUSR: 0.6500 mL | Freq: Once | SUBCUTANEOUS | 0 refills | Status: AC

## 2016-08-15 MED ORDER — ALBUTEROL SULFATE HFA 108 (90 BASE) MCG/ACT IN AERS: 2.0000 | INHALATION_SPRAY | Freq: Four times a day (QID) | RESPIRATORY_TRACT | 11 refills | Status: DC | PRN

## 2016-08-15 MED ORDER — NICOTINE 21 MG/24HR TD PT24: 21.0000 mg | MEDICATED_PATCH | Freq: Every day | TRANSDERMAL | 1 refills | Status: DC

## 2016-08-15 MED ORDER — NICOTINE 7 MG/24HR TD PT24: 7.0000 mg | MEDICATED_PATCH | Freq: Every day | TRANSDERMAL | 0 refills | Status: DC

## 2016-08-15 MED ORDER — NICOTINE 14 MG/24HR TD PT24: 14.0000 mg | MEDICATED_PATCH | Freq: Every day | TRANSDERMAL | 0 refills | Status: DC

## 2016-08-15 NOTE — Assessment & Plan Note (Signed)
Improved follow  LUMBAR FIVE LAMINECTOMY/DISKECTOMY  Post op Rehab ordered for

## 2016-08-15 NOTE — Assessment & Plan Note (Signed)
Smoker with chronic cough concerning for COPD Plan: Refilled albuterol Smoking cessation PFTs ordered

## 2016-08-15 NOTE — Progress Notes (Signed)
Pt is here today to follow up on sciatica and to get her Tdap shot.  Pt needs refiils on albuterol, Xanax,

## 2016-08-15 NOTE — Patient Instructions (Addendum)
K[place]len was seen today for sciatica.  Diagnoses and all orders for this visit:  Anxiety state -     ALPRAZolam (XANAX) 0.5 MG tablet; Take 1 tablet (0.5 mg total) by mouth daily as needed for anxiety or sleep.  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 -     Pulmonary function test; Future -     nicotine (NICODERM CQ - DOSED IN MG/24 HR) 7 mg/24hr patch; Place 1 patch (7 mg total) onto the skin daily. -     nicotine (NICODERM CQ - DOSED IN MG/24 HOURS) 21 mg/24hr patch; Place 1 patch (21 mg total) onto the skin daily. -     nicotine (NICODERM CQ - DOSED IN MG/24 HOURS) 14 mg/24hr patch; Place 1 patch (14 mg total) onto the skin daily.  Screening for HIV (human immunodeficiency virus) -     HIV antibody (with reflex)  Need for hepatitis C screening test -     Hepatitis C antibody, reflex  Need for Zostavax administration -     Zoster Vaccine Live, PF, (ZOSTAVAX) 13086 UNT/0.65ML injection; Inject 19,400 Units into the skin once.   Smoking cessation support: smoking cessation hotline: 1-800-QUIT-NOW.  Smoking cessation classes are available through [facility]. Call [phone] or visit our website at [email or web address]   F/u in 3 months for HTN    Dr. Adrian Blackwater

## 2016-08-15 NOTE — Progress Notes (Signed)
LCSWA introduced self and explained role at [place] disclosed that she received a consult from Dr. Adrian Blackwater to address depression and anxiety.  Pt reported interest in initiating behavioral health services. LCSWA provided pt with contact information and strongly encouraged pt to schedule an appointment. Pt was appreciative for information.

## 2016-08-15 NOTE — Assessment & Plan Note (Signed)
Chronic anxiety Refilled xanax Patient referred to clinical social worker

## 2016-08-15 NOTE — Progress Notes (Signed)
Subjective:  Patient ID: Meredith Church, female    DOB: October 16, 1955  Age: 60 y.o. MRN: [id number]  CC: Sciatica   HPI Meredith Church presents for   1. Anxiety: chronic intermittent anxiety with trouble sleeping. She is taking xanax and request med refill. She is willing to try elavil again. She has not returned to work. She has been approved for medicaid and disability up to $16,000 per year over her social security income. She is recently stressed because her brother who is 2 years younger was recently diagnosed with colon cancer.    2. 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 She is taking oxycodone for post op pain. She still has some numbness in her L foot but it has improved. R leg still bothers her. R thigh pain is 8/10.   Lumbar MRI 11/01/15 IMPRESSION: The patient appears to have transitional anatomy at the lumbosacral junction as described above. Recommend correlation with plain films prior to any intervention.  Shallow disc bulge with a superimposed down turning central protrusion at L4-5 results in some narrowing of the lateral recesses at the L4-5 level. The down turning protrusion contacts the descending left L5 root   3. Cough: intermittent at night. Productive.  Also used during colds. No chest pain and shortness of breath. She is currently smoking 1/2 PPD. She desires to quit. She is reluctant to start chantix or wellbutrin. She prefers to use nicotine patches.   Past Surgical History:  Procedure Laterality Date  . ABDOMINAL HYSTERECTOMY    . LIPOMA EXCISION    . LUMBAR LAMINECTOMY/DECOMPRESSION MICRODISCECTOMY N/A 07/03/2016   Procedure: LUMBAR FOUR - LUMBAR FIVE LAMINECTOMY/DISKECTOMY;  Surgeon: [name] [name], MD;  Location: [place];  Service: Neurosurgery;  Laterality: N/A;  LUMBAR FOUR - LUMBAR FIVE LAMINECTOMY/DISKECTOMY  . SALIVARY GLAND SURGERY    . TIBIA FRACTURE SURGERY     right    Social History  Substance Use Topics  . Smoking status: Current Every Day Smoker    Packs/day: 0.50    Types: Cigarettes    Last attempt to quit: 07/18/2014  . Smokeless tobacco: Never Used     Comment: Smokes socially.  . Alcohol use No    Outpatient Medications Prior to Visit  Medication Sig Dispense Refill  . albuterol (PROVENTIL HFA;VENTOLIN HFA) 108 (90 BASE) MCG/ACT inhaler Inhale 2 puffs into the lungs every 6 (six) hours as needed for wheezing or shortness of breath. 1 Inhaler 0  . ALPRAZolam (XANAX) 0.5 MG tablet Take 1 tablet (0.5 mg total) by mouth daily as needed for anxiety or sleep. 30 tablet 2  . amitriptyline (ELAVIL) 75 MG tablet Start with 1/2 tablet, 37.5 mg nightly for two weeks, then 75 mg nightly 30 tablet 3  . amLODipine (NORVASC) 10 MG tablet TAKE 1 TABLET BY MOUTH DAILY 30 tablet 2  . ammonium lactate (LAC-HYDRIN) 12 % cream Apply topically as needed for dry skin. (Patient taking differently: Apply 1 g topically 2 (two) times daily as needed (for ezcema.). ) 385 g 0  . Biotin 1000 MCG tablet Take 2,000 mcg by mouth daily.    Marland Kitchen gabapentin (NEURONTIN) 300 MG capsule Take 1 capsule (300 mg total) by mouth 3 (three) times daily. 90 capsule 0  . L-LYSINE PO Take 1 tablet by mouth daily as needed (to help control ezcema.).     Marland Kitchen meloxicam (MOBIC) 7.5 MG tablet Take 1 tablet (7.5 mg total) by  mouth daily. 30 tablet 3  . methocarbamol (ROBAXIN) 750 MG tablet Take 1 tablet (750 mg total) by mouth 4 (four) times daily. 60 tablet 0  . metoprolol (LOPRESSOR) 50 MG tablet TAKE 1 TABLET BY MOUTH 2 TIMES DAILY 60 tablet 11  . Misc. Devices (CANE) MISC 1 each by Does not apply route daily. 1 each 0  . oxyCODONE (OXY IR/ROXICODONE) 5 MG immediate release tablet Take 1-2 tablets (5-10 mg total) by mouth every 3 (three) hours as needed for breakthrough pain. 50 tablet 0   No facility-administered medications prior to visit.     ROS Review of Systems  Constitutional: Negative  for chills and fever.  Eyes: Negative for visual disturbance.  Respiratory: Positive for cough. Negative for shortness of breath.   Cardiovascular: Negative for chest pain.  Gastrointestinal: Negative for abdominal pain and blood in stool.  Musculoskeletal: Positive for back pain and myalgias (R anterior thigh ). Negative for arthralgias and gait problem.  Skin: Negative for rash.  Allergic/Immunologic: Negative for immunocompromised state.  Neurological: Positive for numbness (L lateral leg ).  Hematological: Negative for adenopathy. Does not bruise/bleed easily.  Psychiatric/Behavioral: Positive for dysphoric mood. Negative for suicidal ideas. The patient is nervous/anxious.     Objective:  BP 131/74 (BP Location: Left Arm, Patient Position: Sitting, Cuff Size: Small)   Pulse 91   Temp 98 F (36.7 C) (Oral)   Ht 5\' 4"  (1.626 m)   Wt 178 lb 6.4 oz (80.9 kg)   SpO2 95%   BMI 30.62 kg/m   BP/Weight 08/15/2016 07/04/2016 99991111  Systolic BP A999333 [id number] -  Diastolic BP 74 80 -  Wt. (Lbs) 178.4 - 176  BMI 30.62 - 30.21    Physical Exam  Constitutional: She is oriented to person, place, and time. She appears well-developed and well-nourished. No distress.  HENT:  Head: Normocephalic and atraumatic.  Cardiovascular: Normal rate, regular rhythm, normal heart sounds and intact distal pulses.   Pulmonary/Chest: Effort normal and breath sounds normal.  Musculoskeletal: She exhibits no edema.       Back:  Neurological: She is alert and oriented to person, place, and time.  Skin: Skin is warm and dry. No rash noted.  Psychiatric: Her speech is normal and behavior is normal. Judgment and thought content normal. Cognition and memory are normal. She exhibits a depressed mood.   Depression screen [facility] 2/9 08/15/2016 06/03/2016 06/03/2016 02/14/2016 11/02/2015  Decreased Interest 2 2 2 2 2   Down, Depressed, Hopeless 2 1 1 1 2   PHQ - 2 Score 4 3 3 3 4   Altered sleeping 2 3 - 3 1  Tired,  decreased energy 2 1 - 1 2  Change in appetite 2 0 - 0 2  Feeling bad or failure about yourself  3 0 - 1 1  Trouble concentrating 3 1 - 2 1  Moving slowly or fidgety/restless 0 0 - 0 0  Suicidal thoughts 0 0 - 0 0  PHQ-9 Score 16 8 - 10 11  Difficult doing work/chores - - - Not difficult at all -    GAD 7 : Generalized Anxiety Score 08/15/2016 06/03/2016 02/14/2016 11/02/2015  Nervous, Anxious, on Edge 3 2 3 3   Control/stop worrying 3 3 3 3   Worry too much - different things 3 3 3 3   Trouble relaxing 3 3 3 3   Restless 2 2 1 1   Easily annoyed or irritable 3 1 1 2   Afraid - awful might happen 3 1 1  3  Total GAD 7 Score 20 15 15 18       Assessment & Plan:  Meredith Church was seen today for sciatica.  Diagnoses and all orders for this visit:  Anxiety state -     ALPRAZolam (XANAX) 0.5 MG tablet; Take 1 tablet (0.5 mg total) by mouth daily as needed for anxiety or sleep.  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 -     Pulmonary function test; Future -     nicotine (NICODERM CQ - DOSED IN MG/24 HR) 7 mg/24hr patch; Place 1 patch (7 mg total) onto the skin daily. -     nicotine (NICODERM CQ - DOSED IN MG/24 HOURS) 21 mg/24hr patch; Place 1 patch (21 mg total) onto the skin daily. -     nicotine (NICODERM CQ - DOSED IN MG/24 HOURS) 14 mg/24hr patch; Place 1 patch (14 mg total) onto the skin daily.  Screening for HIV (human immunodeficiency virus) -     HIV antibody (with reflex)  Need for hepatitis C screening test -     Hepatitis C antibody, reflex  Need for Zostavax administration -     Zoster Vaccine Live, PF, (ZOSTAVAX) 36644 UNT/0.65ML injection; Inject 19,400 Units into the skin once.  Lumbosacral spondylosis with  radiculopathy  Other orders -     Tdap vaccine greater than or equal to 7yo IM   There are no diagnoses linked to this encounter.  There are no diagnoses linked to this encounter. No orders of the defined types were placed in this encounter.   Follow-up: Return in about 3 months (around 11/13/2016) for HTN.   Boykin Nearing MD

## 2016-08-16 LAB — HEPATITIS C ANTIBODY: HCV Ab: REACTIVE — AB

## 2016-08-16 LAB — HIV ANTIBODY (ROUTINE TESTING W REFLEX): HIV 1&2 Ab, 4th Generation: NONREACTIVE

## 2016-08-19 LAB — HEPATITIS C RNA QUANTITATIVE: HCV Quantitative: NOT DETECTED IU/mL (ref <15)

## 2016-08-20 ENCOUNTER — Ambulatory Visit: Payer: Medicaid Other | Admitting: Physical Therapy

## 2016-08-22 ENCOUNTER — Encounter (HOSPITAL_COMMUNITY): Payer: Medicaid Other

## 2016-08-26 ENCOUNTER — Ambulatory Visit: Payer: Medicaid Other | Attending: Neurological Surgery | Admitting: Physical Therapy

## 2016-08-28 ENCOUNTER — Encounter (HOSPITAL_COMMUNITY): Payer: Medicaid Other

## 2016-08-28 MED FILL — METOPROLOL TARTRATE 50 MG T: 50 | 30 days supply | Qty: 60 | Fill #2

## 2016-08-28 MED FILL — AMLODIPINE BESYLATE 10 MG T: 10 | 30 days supply | Qty: 30 | Fill #1

## 2016-09-01 ENCOUNTER — Ambulatory Visit: Payer: Medicaid Other | Admitting: Licensed Clinical Social Worker

## 2016-09-02 ENCOUNTER — Telehealth: Payer: Self-pay | Admitting: Licensed Clinical Social Worker

## 2016-09-02 ENCOUNTER — Ambulatory Visit: Payer: Medicaid Other | Attending: Family Medicine | Admitting: Licensed Clinical Social Worker

## 2016-09-02 DIAGNOSIS — F419 Anxiety disorder, unspecified: Secondary | ICD-10-CM | POA: Insufficient documentation

## 2016-09-02 DIAGNOSIS — F411 Generalized anxiety disorder: Secondary | ICD-10-CM | POA: Diagnosis not present

## 2016-09-02 DIAGNOSIS — G8929 Other chronic pain: Secondary | ICD-10-CM | POA: Insufficient documentation

## 2016-09-02 DIAGNOSIS — M5430 Sciatica, unspecified side: Secondary | ICD-10-CM | POA: Insufficient documentation

## 2016-09-02 DIAGNOSIS — F329 Major depressive disorder, single episode, unspecified: Secondary | ICD-10-CM | POA: Diagnosis present

## 2016-09-02 NOTE — Telephone Encounter (Signed)
LCSWA conducted Clinical Social Work Assessment with pt currently experiencing depression and anxiety triggered by chronic pain associated with sciatica, in addition, to financial strain. Pt disclosed hx of SI with no current plan or intent to self-harm and/or harm others.   Pt requested referral to Pain Management Clinic and Psychiatry by PCP. Also stated that she was unable to obtain Shingles vaccine at [facility] or any other retail pharmacy. Was informed that she needs to have vaccine completed at [facility]. Please advise.

## 2016-09-02 NOTE — BH Specialist Note (Signed)
Session Start time: 10:30 am   End Time: 11:10 am Total Time:  40 minutes Type of Service: [place] Interpreter: No.   Interpreter Name & Language: N/A # [name] Visits July 2017-June 2018: 1st   SUBJECTIVE: Meredith Church is a 60 y.o. female  Pt. was referred by Dr. Adrian Blackwater for:  anxiety and depression. Pt. reports the following symptoms/concerns: difficulty sleeping, racing thoughts, overwhelming feelings of sadness, panic attacks, and suicidal ideations Duration of problem:  Pt reported that she was diagnosed with Anxiety over ten years ago after the death of a sibling.  Severity: severe Previous treatment: Pt reported no hx with psychotherapy. She is prescribed behavioral health medication by PCP   OBJECTIVE: Mood: Anxious & Affect: Depressed and Tearful Risk of harm to self or others: Pt reported that she is "ashamed" to admit that she has suicidal ideations when feeling overwhelmed from financial strain or chronic pain associated with sciatica. Pt denies plan or intent to self-harm and/or harm others. Assessments administered: PHQ-9; GAD-7  LIFE CONTEXT:  Family & Social: Pt has two adult daughters who reside in [place], [place] and [place]. Pt reported having "some friends"; however, stated that she does not talk much about her mental and physical health to family and friends School/ Work: Pt is unemployed noting that last place of employment was stressful and too demanding on body. She receives SSI and Medicaid. Pt has been denied disability. Self-Care: Pt stated that she enjoyed exercising prior to decline in health. She has difficulty in sleeping due to chronic pain and anxiety. Pt denied substance use. Life changes: Pt had an operation recently in October 2017 regarding sciatica. She is currently unemployed and has financial strain What is important to pt/family (values): Family   GOALS ADDRESSED:  Decrease symptoms of depression Decrease symptoms of  anxiety Obtain healthy coping skills to address chronic pain  INTERVENTIONS: Solution Focused, Strength-based and Supportive   ASSESSMENT:  Pt currently experiencing depression and anxiety triggered by chronic pain associated with sciatica, in addition, to financial strain. Pt reports difficulty sleeping, racing thoughts, overwhelming feelings of sadness, panic attacks, and suicidal ideations. Pt denies plan or intent to self-harm and/or harm others. She has difficulty utilizing emotional support from family and friends. Pt may benefit from psychoeducation, psychotherapy, and medication management. [place] educated pt on the cycle of depression and anxiety. LCSWA inquired protective factors with pt. Pt reported that she does not want to harm self due to the love for her adult children and grandchildren. LCSWA discussed a safety plan with pt and provided resources for crisis interventions. Pt was strongly encouraged to initiate psychotherapy and comply with medication management. LCSWA discussed benefits of applying healthy coping skills to decrease symptoms of depression, anxiety, and chronic pain. Pt identified healthy strategies to utilize on a weekly basis. Pt reported interest in obtaining referral to Pain Management Referral and long-term psychotherapy. LCSWA provided pt with community resources for behavioral health.      PLAN: 1. F/U with behavioral health clinician: Pt was encouraged to contact [place] if symptoms worsen or fail to improve to schedule behavioral appointments at [facility] 2. Behavioral Health meds: Xanax 3. Behavioral recommendations: LCSWA recommends that pt apply healthy coping skills discussed and utilize community resources to initiate psychotherapy. Pt is encouraged to schedule follow up appointment with LCSWA 4. Referral: Brief Counseling/Psychotherapy, [employer], Problem-solving teaching/coping strategies, Psychoeducation and Supportive Counseling 5. From scale of  1-10, how likely are you to follow plan: 8/10  Rebekah Chesterfield, MSW, LCSWA Clinical Social Work 09/02/16 5:42 PM  Warmhandoff: no

## 2016-09-04 ENCOUNTER — Telehealth: Payer: Self-pay

## 2016-09-04 NOTE — Telephone Encounter (Signed)
Vaccine is not urgent If she should not afford at this time, she is can wait until she has medicare

## 2016-09-04 NOTE — Telephone Encounter (Signed)
Pt returning nurse's call, please f/u with pt.   °

## 2016-09-04 NOTE — Telephone Encounter (Signed)
Pt was called and a VM was left informing pt to return phone call. 

## 2016-09-29 MED FILL — METOPROLOL TARTRATE 50 MG T: 50 | 30 days supply | Qty: 60 | Fill #3

## 2016-09-29 MED FILL — AMLODIPINE BESYLATE 10 MG T: 10 | 30 days supply | Qty: 30 | Fill #2

## 2016-10-09 ENCOUNTER — Telehealth: Payer: Self-pay

## 2016-10-09 DIAGNOSIS — F411 Generalized anxiety disorder: Secondary | ICD-10-CM

## 2016-10-09 NOTE — Telephone Encounter (Signed)
Patient was provided 2 refills on last Rx for xanax. She just need to call her pharmacy

## 2016-10-09 NOTE — Telephone Encounter (Signed)
Pt called to request a refill on her Xanax.

## 2016-10-10 NOTE — Telephone Encounter (Signed)
Contacted pt and made pt aware of Dr. Adrian Blackwater response pt is aware and doesn't have any questions or concerns

## 2016-10-16 ENCOUNTER — Ambulatory Visit
Admission: RE | Admit: 2016-10-16 | Discharge: 2016-10-16 | Disposition: A | Discharge status: Home / Self Care | Payer: Medicaid Other | Source: Ambulatory Visit | Attending: Neurological Surgery | Admitting: Neurological Surgery

## 2016-10-16 ENCOUNTER — Other Ambulatory Visit: Payer: Self-pay | Admitting: Neurological Surgery

## 2016-10-16 DIAGNOSIS — M25551 Pain in right hip: Secondary | ICD-10-CM

## 2016-10-30 ENCOUNTER — Other Ambulatory Visit: Payer: Self-pay | Admitting: Family Medicine

## 2016-10-30 DIAGNOSIS — I10 Essential (primary) hypertension: Secondary | ICD-10-CM

## 2016-10-30 MED FILL — METOPROLOL TARTRATE 50 MG T: 50 | 30 days supply | Qty: 60 | Fill #4

## 2016-10-31 MED FILL — AMLODIPINE BESYLATE 10 MG T: 10 | 30 days supply | Qty: 30 | Fill #0

## 2016-11-03 ENCOUNTER — Ambulatory Visit (INDEPENDENT_AMBULATORY_CARE_PROVIDER_SITE_OTHER): Payer: Medicaid Other | Admitting: Orthopaedic Surgery

## 2016-11-03 ENCOUNTER — Encounter (INDEPENDENT_AMBULATORY_CARE_PROVIDER_SITE_OTHER): Payer: Self-pay | Admitting: Orthopaedic Surgery

## 2016-11-03 DIAGNOSIS — M1611 Unilateral primary osteoarthritis, right hip: Secondary | ICD-10-CM | POA: Diagnosis not present

## 2016-11-03 MED ORDER — MELOXICAM 7.5 MG PO TABS: 7.5000 mg | ORAL_TABLET | Freq: Two times a day (BID) | ORAL | 2 refills | Status: DC | PRN

## 2016-11-03 NOTE — Progress Notes (Signed)
Office Visit Note   Patient: Meredith Church           Date of Birth: November 07, 1955           MRN: [id number] Visit Date: 11/03/2016              Requested by: Boykin Nearing, MD [address] [place], [place] [address] PCP: Minerva Ends, MD   Assessment & Plan: Visit Diagnoses:  1. Primary osteoarthritis of right hip     Plan: Right hip DJD.  Reviewed xrays with patient.  Discussed THA when patient is ready for this.  Will set up for right hip cortisone injection first.  F/u prn.   Total face to face encounter time was greater than 45 minutes and over half of this time was spent in counseling and/or coordination of care.  Follow-Up Instructions: Return if symptoms worsen or fail to improve.   Orders:  Orders Placed This Encounter  Procedures  . Ambulatory referral to Physical Medicine Rehab   Meds ordered this encounter  Medications  . DISCONTD: meloxicam (MOBIC) 7.5 MG tablet    Sig: Take 1 tablet (7.5 mg total) by mouth 2 (two) times daily as needed for pain.    Dispense:  30 tablet    Refill:  2      Procedures: No procedures performed   Clinical Data: No additional findings.   Subjective: Chief Complaint  Patient presents with  . Right Hip - Pain    61 yo female with anxiety comes in with chronic right hip pain worse with ambulation and activity.  She has chronic pain in her buttocks without radicular pain.  Takes oxycodone prn for pain.  otc meds give no relief.  She has lumbar decompression surgery with Dr. Cyndy Freeze and did well from this.  She's had a dramatic decrease in activity due to her hip pain.  She smokes 3-4 cigs/day.      Review of Systems  Constitutional: Negative.   HENT: Negative.   Eyes: Negative.   Respiratory: Negative.   Cardiovascular: Negative.   Endocrine: Negative.   Musculoskeletal: Negative.   Neurological: Negative.   Hematological: Negative.   Psychiatric/Behavioral: Negative.   All other systems reviewed and are  negative.    Objective: Vital Signs: There were no vitals taken for this visit.  Physical Exam  Constitutional: She is oriented to person, place, and time. She appears well-developed and well-nourished.  HENT:  Head: Normocephalic and atraumatic.  Eyes: EOM are normal.  Neck: Neck supple.  Pulmonary/Chest: Effort normal.  Abdominal: Soft.  Neurological: She is alert and oriented to person, place, and time.  Skin: Skin is warm. Capillary refill takes less than 2 seconds.  Psychiatric: She has a normal mood and affect. Her behavior is normal. Judgment and thought content normal.  Nursing note and vitals reviewed.   Ortho Exam Right hip - pain with ROM of hip - equivocal [place]inchfield - neg SLR - no knee sxs Specialty Comments:  No specialty comments available.  Imaging: No results found.   PMFS History: Patient Active Problem List   Diagnosis Date Noted  . Primary osteoarthritis of right hip 11/03/2016  . Lumbosacral spondylosis with radiculopathy 07/03/2016  . 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 Positive D dimer 07/13/2015  . Xerosis of skin 05/30/2015  . Chronic radicular pain of lower back 05/07/2015  . 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 Sprain, strain 11/16/2014  . Smoker  07/27/2014  . Anxiety state 01/11/2014  . Essential hypertension, benign 01/11/2014   Past Medical History:  Diagnosis Date  . Anxiety Dx 2007  . Eczema   . Hyperlipidemia Dx 2010  . Hypertension Dx 2010  . Numbness and tingling of lower extremity     Family History  Problem Relation Age of Onset  . Diabetes Mother   . Diabetes Sister   . Cancer Brother     colon cancer     Past Surgical History:  Procedure Laterality Date  . ABDOMINAL HYSTERECTOMY    . LIPOMA EXCISION    . LUMBAR LAMINECTOMY/DECOMPRESSION MICRODISCECTOMY N/A 07/03/2016   Procedure: LUMBAR FOUR - LUMBAR FIVE LAMINECTOMY/DISKECTOMY;  Surgeon: [name]  [name], MD;  Location: [place];  Service: Neurosurgery;  Laterality: N/A;  LUMBAR FOUR - LUMBAR FIVE LAMINECTOMY/DISKECTOMY  . SALIVARY GLAND SURGERY    . TIBIA FRACTURE SURGERY     right   Social History   Occupational History  . Not on file.   Social History Main Topics  . Smoking status: Current Every Day Smoker    Packs/day: 0.50    Types: Cigarettes    Last attempt to quit: 07/18/2014  . Smokeless tobacco: Never Used     Comment: Smokes socially.  . Alcohol use No  . Drug use: No  . Sexual activity: Not on file

## 2016-11-04 ENCOUNTER — Ambulatory Visit (INDEPENDENT_AMBULATORY_CARE_PROVIDER_SITE_OTHER): Payer: Medicaid Other | Admitting: Orthopaedic Surgery

## 2016-11-07 ENCOUNTER — Other Ambulatory Visit (INDEPENDENT_AMBULATORY_CARE_PROVIDER_SITE_OTHER): Payer: Self-pay | Admitting: Orthopaedic Surgery

## 2016-11-07 ENCOUNTER — Telehealth (INDEPENDENT_AMBULATORY_CARE_PROVIDER_SITE_OTHER): Payer: Self-pay | Admitting: Orthopaedic Surgery

## 2016-11-07 DIAGNOSIS — M1611 Unilateral primary osteoarthritis, right hip: Secondary | ICD-10-CM

## 2016-11-10 ENCOUNTER — Ambulatory Visit (INDEPENDENT_AMBULATORY_CARE_PROVIDER_SITE_OTHER): Payer: Medicaid Other | Admitting: Physical Medicine and Rehabilitation

## 2016-11-10 NOTE — Telephone Encounter (Signed)
See message below and advise. Thanks

## 2016-11-10 NOTE — Telephone Encounter (Signed)
Do you sched there also? See message below

## 2016-11-10 NOTE — Telephone Encounter (Signed)
Can we see if [facility] can see her sooner for the injection

## 2016-11-11 ENCOUNTER — Telehealth (INDEPENDENT_AMBULATORY_CARE_PROVIDER_SITE_OTHER): Payer: Self-pay | Admitting: *Deleted

## 2016-11-11 NOTE — Telephone Encounter (Signed)
Received fax from [place] at [facility] pt has appt for [facility] on 11/17/16 at 2:30p, per fax pt aware of appt.

## 2016-11-11 NOTE — Telephone Encounter (Signed)
I contacted had a very lengthy conversation (20 mins) regarding getting her scheduled for ESI injection at [facility], which is scheduled for this Friday March 2 at 1:30 with Dr. Rip Harbour. Pt is not sure whether to keep this appt or wait and go to [facility] imaging on the 5th of March or wait to come to [place] on March 7th. Pt states she prefers to come to Dr. Ernestina Patches but is hurting and wants to be seen sooner. I advised the pt that she would have to see Dr. Rip Harbour first before the injection on [facility] and is not a guarantee that she would get the injection of hip at that time. Pt is going to see if can get a ride and think abut it and will call me if needs to cancel the appt with [facility]. I gave pt my direct # so she can call me.

## 2016-11-14 ENCOUNTER — Ambulatory Visit: Payer: Medicaid Other | Attending: Family Medicine | Admitting: Family Medicine

## 2016-11-14 ENCOUNTER — Encounter: Payer: Self-pay | Admitting: Family Medicine

## 2016-11-14 VITALS — BP 119/83 | HR 77 | Temp 98.5°F | Ht 64.0 in | Wt 176.2 lb

## 2016-11-14 DIAGNOSIS — M1611 Unilateral primary osteoarthritis, right hip: Secondary | ICD-10-CM | POA: Diagnosis not present

## 2016-11-14 DIAGNOSIS — Z79899 Other long term (current) drug therapy: Secondary | ICD-10-CM | POA: Diagnosis not present

## 2016-11-14 DIAGNOSIS — F17210 Nicotine dependence, cigarettes, uncomplicated: Secondary | ICD-10-CM | POA: Insufficient documentation

## 2016-11-14 DIAGNOSIS — R21 Rash and other nonspecific skin eruption: Secondary | ICD-10-CM | POA: Insufficient documentation

## 2016-11-14 MED ORDER — HUGO ROLLING WALKER BASIC MISC: 1.0000 | Freq: Every day | 0 refills | Status: AC

## 2016-11-14 MED ORDER — FLUOCINONIDE-E 0.05 % EX CREA: 1.0000 "application " | TOPICAL_CREAM | Freq: Two times a day (BID) | CUTANEOUS | 0 refills | Status: DC

## 2016-11-14 NOTE — Progress Notes (Signed)
Pt is here today for a rash under right arm.

## 2016-11-14 NOTE — Progress Notes (Signed)
Subjective:  Patient ID: Meredith Church, female    DOB: March 29, 1956  Age: 61 y.o. MRN: [id number]  CC: Rash   HPI Meredith Church presents for   1. Rash under R arm: for the past 3 days. She treated with kenalog 0.5% cream with improvement. Rash was mildly pruritic. She is also concerned about skin tag on R breast. Seborrheic keratoses on L breast and around hairline.   2. Right hip pain: she has hx of sciatica s/p lumbar laminectomy/decompression. She has persistent R hip pain with walking. She was referred to ortho who sent her for R hip x-ray. Her x-ray revealed moderate arthritis. He orthopaedic surgeon has recommended R hip replacement and a rolling walker.   Past Surgical History:  Procedure Laterality Date  . ABDOMINAL HYSTERECTOMY    . LIPOMA EXCISION    . LUMBAR LAMINECTOMY/DECOMPRESSION MICRODISCECTOMY N/A 07/03/2016   Procedure: LUMBAR FOUR - LUMBAR FIVE LAMINECTOMY/DISKECTOMY;  Surgeon: [name] [name], MD;  Location: [facility];  Service: Neurosurgery;  Laterality: N/A;  LUMBAR FOUR - LUMBAR FIVE LAMINECTOMY/DISKECTOMY  . SALIVARY GLAND SURGERY    . TIBIA FRACTURE SURGERY     right    Social History  Substance Use Topics  . Smoking status: Current Every Day Smoker    Packs/day: 0.50    Types: Cigarettes    Last attempt to quit: 07/18/2014  . Smokeless tobacco: Never Used     Comment: Smokes socially.  . Alcohol use No    Outpatient Medications Prior to Visit  Medication Sig Dispense Refill  . albuterol (PROVENTIL HFA;VENTOLIN HFA) 108 (90 Base) MCG/ACT inhaler Inhale 2 puffs into the lungs every 6 (six) hours as needed for wheezing or shortness of breath. 1 Inhaler 11  . ALPRAZolam (XANAX) 0.5 MG tablet Take 1 tablet (0.5 mg total) by mouth daily as needed for anxiety or sleep. 30 tablet 2  . amitriptyline (ELAVIL) 75 MG tablet Start with 1/2 tablet, 37.5 mg nightly for two weeks, then 75 mg nightly 30 tablet 3  . amLODipine (NORVASC) 10 MG tablet TAKE 1 TABLET BY  MOUTH DAILY 30 tablet 3  . ammonium lactate (LAC-HYDRIN) 12 % cream Apply topically as needed for dry skin. (Patient taking differently: Apply 1 g topically 2 (two) times daily as needed (for ezcema.). ) 385 g 0  . Biotin 1000 MCG tablet Take 2,000 mcg by mouth daily.    Marland Kitchen gabapentin (NEURONTIN) 300 MG capsule Take 1 capsule (300 mg total) by mouth 3 (three) times daily. 90 capsule 0  . L-LYSINE PO Take 1 tablet by mouth daily as needed (to help control ezcema.).     Marland Kitchen methocarbamol (ROBAXIN) 750 MG tablet Take 1 tablet (750 mg total) by mouth 4 (four) times daily. 60 tablet 0  . metoprolol (LOPRESSOR) 50 MG tablet TAKE 1 TABLET BY MOUTH 2 TIMES DAILY 60 tablet 11  . Misc. Devices (CANE) MISC 1 each by Does not apply route daily. 1 each 0  . nicotine (NICODERM CQ - DOSED IN MG/24 HOURS) 14 mg/24hr patch Place 1 patch (14 mg total) onto the skin daily. 14 patch 0  . nicotine (NICODERM CQ - DOSED IN MG/24 HOURS) 21 mg/24hr patch Place 1 patch (21 mg total) onto the skin daily. 21 patch 1  . nicotine (NICODERM CQ - DOSED IN MG/24 HR) 7 mg/24hr patch Place 1 patch (7 mg total) onto the skin daily. 14 patch 0  . oxyCODONE (OXY IR/ROXICODONE) 5 MG immediate release tablet Take  1-2 tablets (5-10 mg total) by mouth every 3 (three) hours as needed for breakthrough pain. 50 tablet 0   No facility-administered medications prior to visit.     ROS Review of Systems  Constitutional: Negative for chills and fever.  Eyes: Negative for visual disturbance.  Respiratory: Negative for shortness of breath.   Cardiovascular: Negative for chest pain.  Gastrointestinal: Negative for abdominal pain and blood in stool.  Musculoskeletal: Positive for arthralgias. Negative for back pain.  Skin: Positive for rash.  Allergic/Immunologic: Negative for immunocompromised state.  Hematological: Negative for adenopathy. Does not bruise/bleed easily.  Psychiatric/Behavioral: Negative for dysphoric mood and suicidal ideas.     Objective:  BP 119/83 (BP Location: Left Arm, Patient Position: Sitting, Cuff Size: Small)   Pulse 77   Temp 98.5 F (36.9 C) (Oral)   Ht 5\' 4"  (1.626 m)   Wt 176 lb 3.2 oz (79.9 kg)   SpO2 95%   BMI 30.24 kg/m   BP/Weight 11/14/2016 08/15/2016 [id number]  Systolic BP 123456 [id number]  Diastolic BP 83 74 80  Wt. (Lbs) 176.2 178.4 -  BMI 30.24 30.62 -    Physical Exam  Constitutional: She is oriented to person, place, and time. She appears well-developed and well-nourished. No distress.  HENT:  Head: Normocephalic and atraumatic.  Cardiovascular: Normal rate, regular rhythm, normal heart sounds and intact distal pulses.   Pulmonary/Chest: Effort normal and breath sounds normal.  Musculoskeletal: She exhibits no edema.  Neurological: She is alert and oriented to person, place, and time.  Skin: Skin is warm and dry. No rash noted.     Psychiatric: She has a normal mood and affect.   10/16/2016 R hip x-ray IMPRESSION: 1. No fracture, dislocation or acute finding.  No bone lesion. 2. Moderate arthropathic changes of the right hip, most likely osteoarthritis. Assessment & Plan:  Meredith Church was seen today for rash.  Diagnoses and all orders for this visit:  Skin rash -     fluocinonide-emollient (LIDEX-E) 0.05 % cream; Apply 1 application topically 2 (two) times daily.  Primary osteoarthritis of right hip -     Misc. Devices ([place]) MISC; 1 each by Does not apply route daily.   There are no diagnoses linked to this encounter.  No orders of the defined types were placed in this encounter.   Follow-up: Return in about 2 weeks (around 11/28/2016) for skin tag removal and cryotherapy .   Boykin Nearing MD

## 2016-11-14 NOTE — Patient Instructions (Signed)
Shantinique was seen today for rash.  Diagnoses and all orders for this visit:  Skin rash -     fluocinonide-emollient (LIDEX-E) 0.05 % cream; Apply 1 application topically 2 (two) times daily.   F/u in 2 weeks 2 PM, 30 minute appointment for skin tag removal or cryotherapy for seborrheic keratoses   Dr. Adrian Blackwater

## 2016-11-17 ENCOUNTER — Other Ambulatory Visit: Payer: Self-pay | Admitting: Pharmacist

## 2016-11-17 ENCOUNTER — Inpatient Hospital Stay
Admission: RE | Admit: 2016-11-17 | Discharge: 2016-11-17 | Disposition: A | Discharge status: Home / Self Care | Payer: Medicaid Other | Source: Ambulatory Visit | Attending: Orthopaedic Surgery | Admitting: Orthopaedic Surgery

## 2016-11-17 NOTE — Telephone Encounter (Signed)
Fluocinonide cream prior authorization completed and approved  [id number]

## 2016-11-19 ENCOUNTER — Ambulatory Visit (INDEPENDENT_AMBULATORY_CARE_PROVIDER_SITE_OTHER): Payer: Medicaid Other | Admitting: Physical Medicine and Rehabilitation

## 2016-11-24 ENCOUNTER — Ambulatory Visit (INDEPENDENT_AMBULATORY_CARE_PROVIDER_SITE_OTHER): Payer: Medicaid Other | Admitting: Physical Medicine and Rehabilitation

## 2016-11-28 ENCOUNTER — Encounter: Payer: Self-pay | Admitting: Family Medicine

## 2016-11-28 ENCOUNTER — Ambulatory Visit: Payer: Medicaid Other | Attending: Family Medicine | Admitting: Family Medicine

## 2016-11-28 VITALS — BP 121/74 | HR 87 | Temp 98.1°F | Ht 64.0 in | Wt 175.2 lb

## 2016-11-28 DIAGNOSIS — I10 Essential (primary) hypertension: Secondary | ICD-10-CM | POA: Diagnosis not present

## 2016-11-28 DIAGNOSIS — L821 Other seborrheic keratosis: Secondary | ICD-10-CM | POA: Diagnosis not present

## 2016-11-28 DIAGNOSIS — L918 Other hypertrophic disorders of the skin: Secondary | ICD-10-CM | POA: Insufficient documentation

## 2016-11-28 MED ORDER — AMLODIPINE BESYLATE 10 MG PO TABS: 10.0000 mg | ORAL_TABLET | Freq: Every day | ORAL | 3 refills | Status: DC

## 2016-11-28 MED FILL — AMLODIPINE BESYLATE 10 MG T: 10 | 30 days supply | Qty: 30 | Fill #1

## 2016-11-28 NOTE — Addendum Note (Signed)
Addended by: Gomez Cleverly on: 11/28/2016 03:45 PM   Modules accepted: Orders

## 2016-11-28 NOTE — Progress Notes (Signed)
S: The patient complains of symptomatic skin tags on the R breast and under R axilla. These are irritated by clothing, jewelry and rubbing.   O:  Vitals:   11/28/16 1427  BP: 121/74  Pulse: 87  Temp: 98.1 F (36.7 C)   Vitals:   11/28/16 1427  Weight: 175 lb 3.2 oz (79.5 kg)  Height: 5\' 4"  (1.626 m)   Patient appears well. Several benign skin tags are noted on the R areola and under R axilla.   A: Skin tags   P: Skin tags are snipped off using Betadine for cleansing and sterile iris scissors. Local anesthesia was used, 1 % lidocaine with epinephrine. These pathognomonic lesions are not sent for pathology.  Patient also complained of hyperpigmented papule on face consistent with seborrheic keratoses on forehead and cheeks. Liquid nitrogen was used to remove skin tags.

## 2016-11-28 NOTE — Patient Instructions (Addendum)
Diagnoses and all orders for this visit:  Skin tag  Seborrheic keratoses   Keep band aids on until tomorrow Clean area as normal Reapply Band-Aid if skin is irritated Call if skin becomes red, swollen or there is drainage.  apply aloe vera or cool cloth to forehead to relieve burning sensation   f/u in 4 weeks for repeat liquid nitrogen therapy for seborrheic keratoses  Dr. Adrian Blackwater   Seborrheic Keratosis Seborrheic keratosis is a common, noncancerous (benign) skin growth. This condition causes waxy, rough, tan, brown, or black spots to appear on the skin. These skin growths can be flat or raised. What are the causes? The cause of this condition is not known. What increases the risk? This condition is more likely to develop in:  People who have a family history of seborrheic keratosis.  People who are 8 or older.  People who are pregnant.  People who have had estrogen replacement therapy. What are the signs or symptoms? This condition often occurs on the face, chest, shoulders, back, or other areas. These growths:  Are usually painless, but may become irritated and itchy.  Can be yellow, brown, black, or other colors.  Are slightly raised or have a flat surface.  Are sometimes rough or wart-like in texture.  Are often waxy on the surface.  Are round or oval-shaped.  Sometimes look like they are "stuck on."  Often occur in groups, but may occur as a single growth. How is this diagnosed? This condition is diagnosed with a medical history and physical exam. A sample of the growth may be tested (skin biopsy). You may need to see a skin specialist (dermatologist). How is this treated? Treatment is not usually needed for this condition, unless the growths are irritated or are often bleeding. You may also choose to have the growths removed if you do not like their appearance. Most commonly, these growths are treated with a procedure in which liquid nitrogen is applied to  "freeze" off the growth (cryosurgery). They may also be burned off with electricity or cut off. Follow these instructions at home:  Watch your growth for any changes.  Keep all follow-up visits as told by your health care provider. This is important.  Do not scratch or pick at the growth or growths. This can cause them to become irritated or infected. Contact a health care provider if:  You suddenly have many new growths.  Your growth bleeds, itches, or hurts.  Your growth suddenly becomes larger or changes color. This information is not intended to replace advice given to you by your health care provider. Make sure you discuss any questions you have with your health care provider. Document Released: 10/04/2010 Document Revised: 02/07/2016 Document Reviewed: 01/17/2015 Elsevier Interactive Patient Education  2017 [employer].

## 2016-12-02 ENCOUNTER — Ambulatory Visit (INDEPENDENT_AMBULATORY_CARE_PROVIDER_SITE_OTHER): Payer: Medicaid Other | Admitting: Physical Medicine and Rehabilitation

## 2016-12-02 ENCOUNTER — Encounter (INDEPENDENT_AMBULATORY_CARE_PROVIDER_SITE_OTHER): Payer: Self-pay | Admitting: Physical Medicine and Rehabilitation

## 2016-12-02 ENCOUNTER — Ambulatory Visit (INDEPENDENT_AMBULATORY_CARE_PROVIDER_SITE_OTHER): Payer: Medicaid Other

## 2016-12-02 VITALS — BP 132/84 | HR 61

## 2016-12-02 DIAGNOSIS — M25551 Pain in right hip: Secondary | ICD-10-CM | POA: Diagnosis not present

## 2016-12-02 NOTE — Progress Notes (Signed)
Meredith Church - 61 y.o. female MRN [id number]  Date of birth: 12-Sep-1956  Office Visit Note: Visit Date: 12/02/2016 PCP: Minerva Ends, MD Referred by: Boykin Nearing, MD  Subjective: Chief Complaint  Patient presents with  . Right Hip - Pain   HPI: Meredith Church is a 61 year old female with chronic worsening severe right hip pain for several months. Comes and goes and states there is no rhyme or reason. Feels pain in her hip and buttock. She does get pain in the anterior lateral part of the upper thigh that does radiate anteriorly to the knee. Dr. Erlinda Hong requests a diagnostic and hopefully therapeutic anesthetic hip arthrogram.     ROS Otherwise per HPI.  Assessment & Plan: Visit Diagnoses:  1. Pain in right hip     Plan: Findings:  Right hip anesthetic arthrogram. The patient had good relief during the anesthetic phase.    Meds & Orders: No orders of the defined types were placed in this encounter.   Orders Placed This Encounter  Procedures  . Large Joint Injection/Arthrocentesis  . XR C-ARM NO REPORT    Follow-up: Return if symptoms worsen or fail to improve, for Dr. Erlinda Hong.   Procedures: Hip anesthetic arthrogram Date/Time: 12/02/2016 2:36 PM Performed by: Magnus Sinning Authorized by: Magnus Sinning   Consent Given by:  Patient Site marked: the procedure site was marked   Timeout: prior to procedure the correct patient, procedure, and site was verified   Indications:  Pain and diagnostic evaluation Location:  Hip Site:  R hip joint Prep: patient was prepped and draped in usual sterile fashion   Needle Size:  22 G Needle length (in): 5.0 inches. Approach:  Anterior Ultrasound Guidance: No   Fluoroscopic Guidance: No   Arthrogram: Yes   Medications:  3 mL bupivacaine 0.5 %; 80 mg triamcinolone acetonide 40 MG/ML Aspiration Attempted: Yes   Patient tolerance:  Patient tolerated the procedure well with no immediate complications  Arthrogram demonstrated  excellent flow of contrast throughout the joint surface without extravasation or obvious defect.  The patient had relief of symptoms during the anesthetic phase of the injection.      No notes on file   Clinical History: No specialty comments available.  She reports that she has been smoking Cigarettes.  She has been smoking about 0.50 packs per day. She has never used smokeless tobacco. No results for input(s): HGBA1C, LABURIC in the last 8760 hours.  Objective:  VS:  HT:    WT:   BMI:     BP:132/84  HR:61bpm  TEMP: ( )  RESP:  Physical Exam  Musculoskeletal:  Patient had some hip pain with range of motion gently internally and in ranges. She had good distal strength.    Ortho Exam Imaging: Xr C-arm No Report  Result Date: 12/02/2016 Please see Notes or Procedures tab for imaging impression.   Past Medical/Family/Surgical/Social History: Medications & Allergies reviewed per EMR Patient Active Problem List   Diagnosis Date Noted  . Seborrheic keratoses 11/28/2016  . Primary osteoarthritis of right hip 11/03/2016  . Lumbosacral spondylosis with radiculopathy 07/03/2016  . 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 Positive D dimer 07/13/2015  . Xerosis of skin 05/30/2015  . Chronic radicular pain of lower back 05/07/2015  . 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 Sprain, strain 11/16/2014  . Smoker 07/27/2014  . Anxiety state 01/11/2014  . Essential hypertension, benign 01/11/2014   Past Medical History:  Diagnosis Date  . Anxiety Dx 2007  . Eczema   . Hyperlipidemia Dx 2010  . Hypertension Dx 2010  . Numbness and tingling of lower extremity    Family History  Problem Relation Age of Onset  . Diabetes Mother   . Diabetes Sister   . Cancer Brother     colon cancer    Past Surgical History:  Procedure Laterality Date  . ABDOMINAL HYSTERECTOMY    . LIPOMA EXCISION    . LUMBAR LAMINECTOMY/DECOMPRESSION  MICRODISCECTOMY N/A 07/03/2016   Procedure: LUMBAR FOUR - LUMBAR FIVE LAMINECTOMY/DISKECTOMY;  Surgeon: [name] [name], MD;  Location: [facility];  Service: Neurosurgery;  Laterality: N/A;  LUMBAR FOUR - LUMBAR FIVE LAMINECTOMY/DISKECTOMY  . SALIVARY GLAND SURGERY    . TIBIA FRACTURE SURGERY     right   Social History   Occupational History  . Not on file.   Social History Main Topics  . Smoking status: Current Every Day Smoker    Packs/day: 0.50    Types: Cigarettes    Last attempt to quit: 07/18/2014  . Smokeless tobacco: Never Used     Comment: Smokes socially.  . Alcohol use No  . Drug use: No  . Sexual activity: Not on file

## 2016-12-02 NOTE — Patient Instructions (Signed)

## 2016-12-03 MED ORDER — BUPIVACAINE HCL 0.5 % IJ SOLN
3.0000 mL | INTRAMUSCULAR | Status: AC | PRN
  Administered 2016-12-02: 3 mL via INTRA_ARTICULAR

## 2016-12-03 MED ORDER — TRIAMCINOLONE ACETONIDE 40 MG/ML IJ SUSP
80.0000 mg | INTRAMUSCULAR | Status: AC | PRN
  Administered 2016-12-02: 80 mg via INTRA_ARTICULAR

## 2016-12-26 ENCOUNTER — Telehealth: Payer: Self-pay | Admitting: Family Medicine

## 2016-12-26 ENCOUNTER — Institutional Professional Consult (permissible substitution): Payer: Medicaid Other | Admitting: Pulmonary Disease

## 2016-12-26 DIAGNOSIS — M1611 Unilateral primary osteoarthritis, right hip: Secondary | ICD-10-CM

## 2016-12-26 NOTE — Telephone Encounter (Signed)
Patient call me to request a referral to [facility]   For Hip replacement . Thank you

## 2016-12-29 NOTE — Telephone Encounter (Signed)
Pt. Called requesting for her PCP to refer her to [facility] for hip replacement. Please f/u.

## 2016-12-30 ENCOUNTER — Ambulatory Visit (HOSPITAL_COMMUNITY)
Admission: RE | Admit: 2016-12-30 | Discharge: 2016-12-30 | Disposition: A | Discharge status: Home / Self Care | Payer: Medicaid Other | Source: Ambulatory Visit | Attending: Family Medicine | Admitting: Family Medicine

## 2016-12-30 DIAGNOSIS — F17200 Nicotine dependence, unspecified, uncomplicated: Secondary | ICD-10-CM | POA: Diagnosis present

## 2016-12-30 LAB — PULMONARY FUNCTION TEST
DL/VA % pred: 65 %
DL/VA: 3.14 ml/min/mmHg/L
DLCO unc % pred: 50 %
DLCO unc: 12.28 ml/min/mmHg
FEF 25-75 Post: 0.67 L/sec
FEF 25-75 Pre: 0.6 L/sec
FEF2575-%Change-Post: 11 %
FEF2575-%Pred-Post: 34 %
FEF2575-%Pred-Pre: 30 %
FEV1-%Change-Post: 2 %
FEV1-%Pred-Post: 60 %
FEV1-%Pred-Pre: 59 %
FEV1-Post: 1.22 L
FEV1-Pre: 1.19 L
FEV1FVC-%Change-Post: 6 %
FEV1FVC-%Pred-Pre: 81 %
FEV6-%Change-Post: -1 %
FEV6-%Pred-Post: 72 %
FEV6-%Pred-Pre: 73 %
FEV6-Post: 1.79 L
FEV6-Pre: 1.83 L
FEV6FVC-%Change-Post: 1 %
FEV6FVC-%Pred-Post: 104 %
FEV6FVC-%Pred-Pre: 102 %
FVC-%Change-Post: -2 %
FVC-%Pred-Post: 69 %
FVC-%Pred-Pre: 71 %
FVC-Post: 1.79 L
FVC-Pre: 1.85 L
Post FEV1/FVC ratio: 68 %
Post FEV6/FVC ratio: 100 %
Pre FEV1/FVC ratio: 64 %
Pre FEV6/FVC Ratio: 99 %
RV % pred: 187 %
RV: 3.84 L
TLC % pred: 118 %
TLC: 5.96 L

## 2016-12-30 MED ORDER — ALBUTEROL SULFATE (2.5 MG/3ML) 0.083% IN NEBU
2.5000 mg | INHALATION_SOLUTION | Freq: Once | RESPIRATORY_TRACT | Status: AC
  Administered 2016-12-30: 2.5 mg via RESPIRATORY_TRACT

## 2016-12-31 MED FILL — METOPROLOL TARTRATE 50 MG T: 50 | 30 days supply | Qty: 60 | Fill #5

## 2016-12-31 MED FILL — AMLODIPINE BESYLATE 10 MG T: 10 | 30 days supply | Qty: 30 | Fill #2

## 2016-12-31 NOTE — Telephone Encounter (Signed)
Pt calling in to inquire as to status of referral to [facility] for hip replacement. Please f/u with pt.

## 2016-12-31 NOTE — Telephone Encounter (Signed)
Pt was last seen here for low back pain and referral was placed for that. I don't see a referral from you for hip replacement. Please follow up.

## 2017-01-01 NOTE — Telephone Encounter (Signed)
Please inform patient Ortho referral placed

## 2017-01-01 NOTE — Telephone Encounter (Signed)
Pt was called and a VM was left informing pt of referral being placed.

## 2017-01-22 ENCOUNTER — Other Ambulatory Visit: Payer: Self-pay | Admitting: Family Medicine

## 2017-01-22 DIAGNOSIS — J42 Unspecified chronic bronchitis: Secondary | ICD-10-CM

## 2017-01-22 DIAGNOSIS — J449 Chronic obstructive pulmonary disease, unspecified: Secondary | ICD-10-CM | POA: Insufficient documentation

## 2017-01-22 NOTE — Assessment & Plan Note (Signed)
PFTs done 12/30/16 PFTs reveal moderate to severe obstructive disease and diffusion defect consistent with COPD Plan: Continue smoking cessation with patches Continue albuterol as needed and keep track of need which will help determine if a controller medicine to prevent flare ups is needed

## 2017-01-23 ENCOUNTER — Other Ambulatory Visit: Payer: Self-pay | Admitting: Family Medicine

## 2017-01-23 DIAGNOSIS — Z1231 Encounter for screening mammogram for malignant neoplasm of breast: Secondary | ICD-10-CM

## 2017-01-28 ENCOUNTER — Encounter: Payer: Self-pay | Admitting: Family Medicine

## 2017-02-03 ENCOUNTER — Other Ambulatory Visit: Payer: Self-pay | Admitting: Family Medicine

## 2017-02-03 DIAGNOSIS — I10 Essential (primary) hypertension: Secondary | ICD-10-CM

## 2017-02-03 MED FILL — AMLODIPINE BESYLATE 10 MG T: 10 | 30 days supply | Qty: 30 | Fill #3

## 2017-02-03 MED FILL — METOPROLOL TARTRATE 50 MG T: 50 | 30 days supply | Qty: 60 | Fill #6

## 2017-02-04 ENCOUNTER — Telehealth: Payer: Self-pay | Admitting: Family Medicine

## 2017-02-04 ENCOUNTER — Telehealth: Payer: Self-pay

## 2017-02-04 ENCOUNTER — Other Ambulatory Visit: Payer: Self-pay

## 2017-02-04 DIAGNOSIS — F17200 Nicotine dependence, unspecified, uncomplicated: Secondary | ICD-10-CM

## 2017-02-04 MED ORDER — ALBUTEROL SULFATE HFA 108 (90 BASE) MCG/ACT IN AERS: 2.0000 | INHALATION_SPRAY | Freq: Four times a day (QID) | RESPIRATORY_TRACT | 11 refills | Status: DC | PRN

## 2017-02-04 MED FILL — PROAIR HFA 90 MCG INHALER: 108 (90 BAS | 30 days supply | Qty: 9 | Fill #0

## 2017-02-04 NOTE — Telephone Encounter (Signed)
PT came to the office to drop up a form from [facility] since the PT will going to have surgery, please follw up with PT

## 2017-02-04 NOTE — Telephone Encounter (Signed)
Pt was called and informed of lab results. 

## 2017-02-05 ENCOUNTER — Ambulatory Visit: Payer: Medicaid Other | Admitting: Family Medicine

## 2017-02-05 NOTE — Telephone Encounter (Signed)
Will see patient a today's office visit and complete surgical clearance form

## 2017-02-06 NOTE — H&P (Signed)
TOTAL HIP ADMISSION H&P  Patient is admitted for right total hip arthroplasty, anterior approach.  Subjective:  Chief Complaint:   Right hip primary OA / pain  HPI: Meredith Church, 61 y.o. female, has a history of pain and functional disability in the right hip(s) due to arthritis and patient has failed non-surgical conservative treatments for greater than 12 weeks to include NSAID's and/or analgesics, corticosteriod injections and activity modification.  Onset of symptoms was gradual starting 2+ years ago with gradually worsening course since that time.The patient noted prior procedures of the hip to include sciatic release on the right hip(s).  Patient currently rates pain in the right hip at 10 out of 10 with activity. Patient has night pain, worsening of pain with activity and weight bearing, trendelenberg gait, pain that interfers with activities of daily living and pain with passive range of motion. Patient has evidence of periarticular osteophytes and joint space narrowing by imaging studies. This condition presents safety issues increasing the risk of falls. There is no current active infection.   Risks, benefits and expectations were discussed with the patient.  Risks including but not limited to the risk of anesthesia, blood clots, nerve damage, blood vessel damage, failure of the prosthesis, infection and up to and including death.  Patient understand the risks, benefits and expectations and wishes to proceed with surgery.   PCP: Boykin Nearing, MD  D/C Plans:       Home  Post-op Meds:       No Rx given  Tranexamic Acid:      To be given - IV   Decadron:      Is to be given  FYI:     Xarelto (can't tolerate ASA)  Norco  DME:   Pt already has equipment  PT:   No PT    Patient Active Problem List   Diagnosis Date Noted  . COPD (chronic obstructive pulmonary disease) ([place]) 01/22/2017  . Seborrheic keratoses 11/28/2016  . Primary osteoarthritis of right hip 11/03/2016  .  Lumbosacral spondylosis with radiculopathy 07/03/2016  . 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 Positive D dimer 07/13/2015  . Xerosis of skin 05/30/2015  . Chronic radicular pain of lower back 05/07/2015  . 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 Sprain, strain 11/16/2014  . Smoker 07/27/2014  . Anxiety state 01/11/2014  . Essential hypertension, benign 01/11/2014   Past Medical History:  Diagnosis Date  . Anxiety Dx 2007  . Eczema   . Hyperlipidemia Dx 2010  . Hypertension Dx 2010  . Numbness and tingling of lower extremity     Past Surgical History:  Procedure Laterality Date  . ABDOMINAL HYSTERECTOMY    . LIPOMA EXCISION    . LUMBAR LAMINECTOMY/DECOMPRESSION MICRODISCECTOMY N/A 07/03/2016   Procedure: LUMBAR FOUR - LUMBAR FIVE LAMINECTOMY/DISKECTOMY;  Surgeon: [name] [name], MD;  Location: [facility];  Service: Neurosurgery;  Laterality: N/A;  LUMBAR FOUR - LUMBAR FIVE LAMINECTOMY/DISKECTOMY  . SALIVARY GLAND SURGERY    . TIBIA FRACTURE SURGERY     right    No prescriptions prior to admission.   Allergies  Allergen Reactions  . Other Other (See Comments)     Nickel "breaks my skin out"    Social History  Substance Use Topics  . Smoking status: Current Every Day Smoker    Packs/day: 0.50    Types: Cigarettes    Last attempt to quit: 07/18/2014  . Smokeless tobacco: Never Used  Comment: Smokes socially.  . Alcohol use No    Family History  Problem Relation Age of Onset  . Diabetes Mother   . Diabetes Sister   . Cancer Brother        colon cancer      Review of Systems  Constitutional: Negative.   HENT: Negative.   Eyes: Negative.   Respiratory: Negative.   Cardiovascular: Negative.   Gastrointestinal: Negative.   Genitourinary: Negative.   Musculoskeletal: Positive for back pain, joint pain and myalgias.  Skin: Negative.   Neurological: Negative.   Endo/Heme/Allergies: Negative.    Psychiatric/Behavioral: Positive for depression. The patient is nervous/anxious.     Objective:  Physical Exam  Constitutional: She is oriented to person, place, and time. She appears well-developed.  HENT:  Head: Normocephalic.  Eyes: Pupils are equal, round, and reactive to light.  Neck: Neck supple. No JVD present. No tracheal deviation present. No thyromegaly present.  Cardiovascular: Normal rate, regular rhythm and intact distal pulses.   Respiratory: Effort normal and breath sounds normal. No respiratory distress. She has no wheezes.  GI: Soft. There is no tenderness. There is no guarding.  Musculoskeletal:       Right hip: She exhibits decreased range of motion, decreased strength, tenderness and bony tenderness. She exhibits no swelling, no deformity and no laceration.  Lymphadenopathy:    She has no cervical adenopathy.  Neurological: She is alert and oriented to person, place, and time.  Skin: Skin is warm and dry.  Psychiatric: She has a normal mood and affect.      Labs:  Estimated body mass index is 30.07 kg/m as calculated from the following:   Height as of 11/28/16: 5\' 4"  (1.626 m).   Weight as of 11/28/16: 79.5 kg (175 lb 3.2 oz).   Imaging Review Plain radiographs demonstrate severe degenerative joint disease of the right hip(s). The bone quality appears to be good for age and reported activity level.  Assessment/Plan:  End stage arthritis, right hip(s)  The patient history, physical examination, clinical judgement of the provider and imaging studies are consistent with end stage degenerative joint disease of the right hip(s) and total hip arthroplasty is deemed medically necessary. The treatment options including medical management, injection therapy, arthroscopy and arthroplasty were discussed at length. The risks and benefits of total hip arthroplasty were presented and reviewed. The risks due to aseptic loosening, infection, stiffness,  dislocation/subluxation,  thromboembolic complications and other imponderables were discussed.  The patient acknowledged the explanation, agreed to proceed with the plan and consent was signed. Patient is being admitted for inpatient treatment for surgery, pain control, PT, OT, prophylactic antibiotics, VTE prophylaxis, progressive ambulation and ADL's and discharge planning.The patient is planning to be discharged home.      [place] Pugh Herma Uballe   PA-C  02/06/2017, 3:07 PM

## 2017-02-18 ENCOUNTER — Encounter (HOSPITAL_COMMUNITY): Payer: Self-pay

## 2017-02-18 NOTE — Patient Instructions (Signed)
Meredith Church  02/18/2017   Your procedure is scheduled on: 02/24/17  Report to [facility] Main  Entrance .  Report to admitting at     0900 AM   Call this number if you have problems the morning of surgery [phone]   OR  [phone]   Remember: ONLY 1 PERSON MAY GO WITH YOU TO SHORT STAY TO GET  READY MORNING OF [place].  Do not eat food or drink liquids :After Midnight.     Take these medicines the morning of surgery with A SIP OF WATER:amlodipine, metoprolol,xanax if needed, oxycodone if needed, inhaler if needed and bring                                 You may not have any metal on your body including hair pins and              piercings  Do not wear jewelry, make-up, lotions, powders or perfumes, deodorant             Do not wear nail polish.  Do not shave  48 hours prior to surgery.             .   Do not bring valuables to the hospital. [place].  Contacts, dentures or bridgework may not be worn into surgery.  Leave suitcase in the car. After surgery it may be brought to your room.                 Please read over the following fact sheets you were given: _____________________________________________________________________             [facility] - Preparing for Surgery Before surgery, you can play an important role.  Because skin is not sterile, your skin needs to be as free of germs as possible.  You can reduce the number of germs on your skin by washing with CHG (chlorahexidine gluconate) soap before surgery.  CHG is an antiseptic cleaner which kills germs and bonds with the skin to continue killing germs even after washing. Please DO NOT use if you have an allergy to CHG or antibacterial soaps.  If your skin becomes reddened/irritated stop using the CHG and inform your nurse when you arrive at Short Stay. Do not shave (including legs and underarms) for at least 48 hours prior to the  first CHG shower.  You may shave your face/neck. Please follow these instructions carefully:  1.  Shower with CHG Soap the night before surgery and the  morning of Surgery.  2.  If you choose to wash your hair, wash your hair first as usual with your  normal  shampoo.  3.  After you shampoo, rinse your hair and body thoroughly to remove the  shampoo.                           4.  Use CHG as you would any other liquid soap.  You can apply chg directly  to the skin and wash                       Gently with a scrungie or clean  washcloth.  5.  Apply the CHG Soap to your body ONLY FROM THE NECK DOWN.   Do not use on face/ open                           Wound or open sores. Avoid contact with eyes, ears mouth and genitals (private parts).                       Wash face,  Genitals (private parts) with your normal soap.             6.  Wash thoroughly, paying special attention to the area where your surgery  will be performed.  7.  Thoroughly rinse your body with warm water from the neck down.  8.  DO NOT shower/wash with your normal soap after using and rinsing off  the CHG Soap.                9.  Pat yourself dry with a clean towel.            10.  Wear clean pajamas.            11.  Place clean sheets on your bed the night of your first shower and do not  sleep with pets. Day of Surgery : Do not apply any lotions/deodorants the morning of surgery.  Please wear clean clothes to the hospital/surgery center.  FAILURE TO FOLLOW THESE INSTRUCTIONS MAY RESULT IN THE CANCELLATION OF YOUR SURGERY PATIENT SIGNATURE_________________________________  NURSE SIGNATURE__________________________________  ________________________________________________________________________  WHAT IS A BLOOD TRANSFUSION? Blood Transfusion Information  A transfusion is the replacement of blood or some of its parts. Blood is made up of multiple cells which provide different functions.  Red blood cells carry oxygen and are  used for blood loss replacement.  White blood cells fight against infection.  Platelets control bleeding.  Plasma helps clot blood.  Other blood products are available for specialized needs, such as hemophilia or other clotting disorders. BEFORE THE TRANSFUSION  Who gives blood for transfusions?   Healthy volunteers who are fully evaluated to make sure their blood is safe. This is blood bank blood. Transfusion therapy is the safest it has ever been in the practice of medicine. Before blood is taken from a donor, a complete history is taken to make sure that person has no history of diseases nor engages in risky social behavior (examples are intravenous drug use or sexual activity with multiple partners). The donor's travel history is screened to minimize risk of transmitting infections, such as malaria. The donated blood is tested for signs of infectious diseases, such as HIV and hepatitis. The blood is then tested to be sure it is compatible with you in order to minimize the chance of a transfusion reaction. If you or a relative donates blood, this is often done in anticipation of surgery and is not appropriate for emergency situations. It takes many days to process the donated blood. RISKS AND COMPLICATIONS Although transfusion therapy is very safe and saves many lives, the main dangers of transfusion include:   Getting an infectious disease.  Developing a transfusion reaction. This is an allergic reaction to something in the blood you were given. Every precaution is taken to prevent this. The decision to have a blood transfusion has been considered carefully by your caregiver before blood is given. Blood is not given unless the benefits outweigh the risks. AFTER THE TRANSFUSION  Right after receiving a blood transfusion, you will usually feel much better and more energetic. This is especially true if your red blood cells have gotten low (anemic). The transfusion raises the level of the red  blood cells which carry oxygen, and this usually causes an energy increase.  The nurse administering the transfusion will monitor you carefully for complications. HOME CARE INSTRUCTIONS  No special instructions are needed after a transfusion. You may find your energy is better. Speak with your caregiver about any limitations on activity for underlying diseases you may have. SEEK MEDICAL CARE IF:   Your condition is not improving after your transfusion.  You develop redness or irritation at the intravenous (IV) site. SEEK IMMEDIATE MEDICAL CARE IF:  Any of the following symptoms occur over the next 12 hours:  Shaking chills.  You have a temperature by mouth above 102 F (38.9 C), not controlled by medicine.  Chest, back, or muscle pain.  People around you feel you are not acting correctly or are confused.  Shortness of breath or difficulty breathing.  Dizziness and fainting.  You get a rash or develop hives.  You have a decrease in urine output.  Your urine turns a dark color or changes to pink, red, or brown. Any of the following symptoms occur over the next 10 days:  You have a temperature by mouth above 102 F (38.9 C), not controlled by medicine.  Shortness of breath.  Weakness after normal activity.  The white part of the eye turns yellow (jaundice).  You have a decrease in the amount of urine or are urinating less often.  Your urine turns a dark color or changes to pink, red, or brown. Document Released: 08/29/2000 Document Revised: 11/24/2011 Document Reviewed: 04/17/2008 ExitCare Patient Information 2014 [place].  _______________________________________________________________________  Incentive Spirometer  An incentive spirometer is a tool that can help keep your lungs clear and active. This tool measures how well you are filling your lungs with each breath. Taking long deep breaths may help reverse or decrease the chance of developing breathing  (pulmonary) problems (especially infection) following:  A long period of time when you are unable to move or be active. BEFORE THE PROCEDURE   If the spirometer includes an indicator to show your best effort, your nurse or respiratory therapist will set it to a desired goal.  If possible, sit up straight or lean slightly forward. Try not to slouch.  Hold the incentive spirometer in an upright position. INSTRUCTIONS FOR USE  1. Sit on the edge of your bed if possible, or sit up as far as you can in bed or on a chair. 2. Hold the incentive spirometer in an upright position. 3. Breathe out normally. 4. Place the mouthpiece in your mouth and seal your lips tightly around it. 5. Breathe in slowly and as deeply as possible, raising the piston or the ball toward the top of the column. 6. Hold your breath for 3-5 seconds or for as long as possible. Allow the piston or ball to fall to the bottom of the column. 7. Remove the mouthpiece from your mouth and breathe out normally. 8. Rest for a few seconds and repeat Steps 1 through 7 at least 10 times every 1-2 hours when you are awake. Take your time and take a few normal breaths between deep breaths. 9. The spirometer may include an indicator to show your best effort. Use the indicator as a goal to work toward during each repetition. 10. After  each set of 10 deep breaths, practice coughing to be sure your lungs are clear. If you have an incision (the cut made at the time of surgery), support your incision when coughing by placing a pillow or rolled up towels firmly against it. Once you are able to get out of bed, walk around indoors and cough well. You may stop using the incentive spirometer when instructed by your caregiver.  RISKS AND COMPLICATIONS  Take your time so you do not get dizzy or light-headed.  If you are in pain, you may need to take or ask for pain medication before doing incentive spirometry. It is harder to take a deep breath if you  are having pain. AFTER USE  Rest and breathe slowly and easily.  It can be helpful to keep track of a log of your progress. Your caregiver can provide you with a simple table to help with this. If you are using the spirometer at home, follow these instructions: [place] IF:   You are having difficultly using the spirometer.  You have trouble using the spirometer as often as instructed.  Your pain medication is not giving enough relief while using the spirometer.  You develop fever of 100.5 F (38.1 C) or higher. SEEK IMMEDIATE MEDICAL CARE IF:   You cough up bloody sputum that had not been present before.  You develop fever of 102 F (38.9 C) or greater.  You develop worsening pain at or near the incision site. MAKE SURE YOU:   Understand these instructions.  Will watch your condition.  Will get help right away if you are not doing well or get worse. Document Released: 01/12/2007 Document Revised: 11/24/2011 Document Reviewed: 03/15/2007 [facility] Patient Information 2014 [place], [place].   ________________________________________________________________________

## 2017-02-19 ENCOUNTER — Encounter (HOSPITAL_COMMUNITY)
Admission: RE | Admit: 2017-02-19 | Discharge: 2017-02-19 | Disposition: A | Discharge status: Home / Self Care | Payer: Medicaid Other | Source: Ambulatory Visit | Attending: Orthopedic Surgery | Admitting: Orthopedic Surgery

## 2017-02-19 ENCOUNTER — Encounter (HOSPITAL_COMMUNITY): Payer: Self-pay

## 2017-02-19 DIAGNOSIS — Z01812 Encounter for preprocedural laboratory examination: Secondary | ICD-10-CM | POA: Insufficient documentation

## 2017-02-19 HISTORY — DX: Unspecified osteoarthritis, unspecified site: M19.90

## 2017-02-19 HISTORY — DX: Chronic obstructive pulmonary disease, unspecified: J44.9

## 2017-02-19 LAB — BASIC METABOLIC PANEL
Anion gap: 8 (ref 5–15)
BUN: 14 mg/dL (ref 6–20)
CO2: 27 mmol/L (ref 22–32)
Calcium: 9.7 mg/dL (ref 8.9–10.3)
Chloride: 106 mmol/L (ref 101–111)
Creatinine, Ser: 0.69 mg/dL (ref 0.44–1.00)
GFR calc Af Amer: >60 mL/min (ref >60)
GFR calc non Af Amer: >60 mL/min (ref >60)
Glucose, Bld: 108 mg/dL — ABNORMAL HIGH (ref 65–99)
Potassium: 5.6 mmol/L — ABNORMAL HIGH (ref 3.5–5.1)
Sodium: 141 mmol/L (ref 135–145)

## 2017-02-19 LAB — CBC
HCT: 45.6 % (ref 36.0–46.0)
Hemoglobin: 15.7 g/dL — ABNORMAL HIGH (ref 12.0–15.0)
MCH: 34.4 pg — ABNORMAL HIGH (ref 26.0–34.0)
MCHC: 34.4 g/dL (ref 30.0–36.0)
MCV: 100 fL (ref 78.0–100.0)
Platelets: 257 10*3/uL (ref 150–400)
RBC: 4.56 MIL/uL (ref 3.87–5.11)
RDW: 14.7 % (ref 11.5–15.5)
WBC: 6.8 10*3/uL (ref 4.0–10.5)

## 2017-02-19 LAB — SURGICAL PCR SCREEN
MRSA, PCR: NEGATIVE
Staphylococcus aureus: NEGATIVE

## 2017-02-19 LAB — ABO/RH: ABO/RH(D): A POS

## 2017-02-19 NOTE — Progress Notes (Signed)
BMP done 02/19/17 routed to Dr. Alvan Dame via epic

## 2017-02-19 NOTE — Progress Notes (Signed)
07/02/16 ekg epic

## 2017-02-24 ENCOUNTER — Inpatient Hospital Stay (HOSPITAL_COMMUNITY): Payer: Medicaid Other | Admitting: Anesthesiology

## 2017-02-24 ENCOUNTER — Inpatient Hospital Stay (HOSPITAL_COMMUNITY): Payer: Medicaid Other

## 2017-02-24 ENCOUNTER — Inpatient Hospital Stay (HOSPITAL_COMMUNITY)
Admission: RE | Admit: 2017-02-24 | Discharge: 2017-02-26 | DRG: 470 | Disposition: A | Discharge status: Home / Self Care | Payer: Medicaid Other | Source: Ambulatory Visit | Attending: Orthopedic Surgery | Admitting: Orthopedic Surgery

## 2017-02-24 ENCOUNTER — Encounter (HOSPITAL_COMMUNITY)
Admission: RE | Disposition: A | Discharge status: Home / Self Care | Payer: Self-pay | Source: Ambulatory Visit | Attending: Orthopedic Surgery

## 2017-02-24 ENCOUNTER — Encounter (HOSPITAL_COMMUNITY): Payer: Self-pay | Admitting: Anesthesiology

## 2017-02-24 DIAGNOSIS — Z96641 Presence of right artificial hip joint: Secondary | ICD-10-CM

## 2017-02-24 DIAGNOSIS — F419 Anxiety disorder, unspecified: Secondary | ICD-10-CM | POA: Diagnosis present

## 2017-02-24 DIAGNOSIS — F411 Generalized anxiety disorder: Secondary | ICD-10-CM

## 2017-02-24 DIAGNOSIS — M1611 Unilateral primary osteoarthritis, right hip: Secondary | ICD-10-CM | POA: Diagnosis present

## 2017-02-24 DIAGNOSIS — Z6830 Body mass index (BMI) 30.0-30.9, adult: Secondary | ICD-10-CM | POA: Diagnosis not present

## 2017-02-24 DIAGNOSIS — E785 Hyperlipidemia, unspecified: Secondary | ICD-10-CM | POA: Diagnosis present

## 2017-02-24 DIAGNOSIS — Z90710 Acquired absence of both cervix and uterus: Secondary | ICD-10-CM

## 2017-02-24 DIAGNOSIS — J449 Chronic obstructive pulmonary disease, unspecified: Secondary | ICD-10-CM | POA: Diagnosis present

## 2017-02-24 DIAGNOSIS — M25551 Pain in right hip: Secondary | ICD-10-CM

## 2017-02-24 DIAGNOSIS — F17210 Nicotine dependence, cigarettes, uncomplicated: Secondary | ICD-10-CM | POA: Diagnosis not present

## 2017-02-24 DIAGNOSIS — M1612 Unilateral primary osteoarthritis, left hip: Secondary | ICD-10-CM | POA: Diagnosis not present

## 2017-02-24 DIAGNOSIS — E669 Obesity, unspecified: Secondary | ICD-10-CM | POA: Diagnosis not present

## 2017-02-24 DIAGNOSIS — Z91048 Other nonmedicinal substance allergy status: Secondary | ICD-10-CM | POA: Diagnosis not present

## 2017-02-24 DIAGNOSIS — I10 Essential (primary) hypertension: Secondary | ICD-10-CM | POA: Diagnosis not present

## 2017-02-24 DIAGNOSIS — L309 Dermatitis, unspecified: Secondary | ICD-10-CM | POA: Diagnosis present

## 2017-02-24 DIAGNOSIS — Z91038 Other insect allergy status: Secondary | ICD-10-CM

## 2017-02-24 DIAGNOSIS — Z96649 Presence of unspecified artificial hip joint: Secondary | ICD-10-CM

## 2017-02-24 HISTORY — PX: TOTAL HIP ARTHROPLASTY: SHX124

## 2017-02-24 LAB — TYPE AND SCREEN
ABO/RH(D): A POS
Antibody Screen: NEGATIVE

## 2017-02-24 SURGERY — ARTHROPLASTY, HIP, TOTAL, ANTERIOR APPROACH
Anesthesia: Spinal | Site: Hip | Laterality: Right

## 2017-02-24 MED ORDER — OXYCODONE HCL 5 MG PO TABS
5.0000 mg | ORAL_TABLET | ORAL | Status: DC | PRN
  Administered 2017-02-24: 22:00:00 10 mg via ORAL
  Administered 2017-02-24: 18:00:00 5 mg via ORAL
  Administered 2017-02-25 (×2): 10 mg via ORAL
  Filled 2017-02-24: qty 1
  Filled 2017-02-24 (×3): qty 2

## 2017-02-24 MED ORDER — POLYETHYLENE GLYCOL 3350 17 G PO PACK: 17.0000 g | PACK | Freq: Two times a day (BID) | ORAL | 0 refills | Status: DC

## 2017-02-24 MED ORDER — DEXAMETHASONE SODIUM PHOSPHATE 10 MG/ML IJ SOLN
10.0000 mg | Freq: Once | INTRAMUSCULAR | Status: AC
  Administered 2017-02-25: 09:00:00 10 mg via INTRAVENOUS
  Filled 2017-02-24: qty 1

## 2017-02-24 MED ORDER — BUPIVACAINE IN DEXTROSE 0.75-8.25 % IT SOLN
INTRATHECAL | Status: DC | PRN
  Administered 2017-02-24: 2 mL via INTRATHECAL

## 2017-02-24 MED ORDER — METHOCARBAMOL 1000 MG/10ML IJ SOLN
500.0000 mg | Freq: Four times a day (QID) | INTRAMUSCULAR | Status: DC | PRN
  Administered 2017-02-24: 500 mg via INTRAVENOUS
  Filled 2017-02-24: qty 5
  Filled 2017-02-24: qty 550

## 2017-02-24 MED ORDER — MIDAZOLAM HCL 2 MG/2ML IJ SOLN
INTRAMUSCULAR | Status: AC
  Filled 2017-02-24: qty 2

## 2017-02-24 MED ORDER — MAGNESIUM CITRATE PO SOLN: 1.0000 | Freq: Once | ORAL | Status: DC | PRN

## 2017-02-24 MED ORDER — AMLODIPINE BESYLATE 10 MG PO TABS
10.0000 mg | ORAL_TABLET | Freq: Every day | ORAL | Status: DC
  Filled 2017-02-24 (×2): qty 1

## 2017-02-24 MED ORDER — FENTANYL CITRATE (PF) 100 MCG/2ML IJ SOLN
INTRAMUSCULAR | Status: DC | PRN
  Administered 2017-02-24: 100 ug via INTRAVENOUS

## 2017-02-24 MED ORDER — PHENYLEPHRINE HCL 10 MG/ML IJ SOLN
INTRAVENOUS | Status: DC | PRN
  Administered 2017-02-24: 50 ug/min via INTRAVENOUS

## 2017-02-24 MED ORDER — PROPOFOL 10 MG/ML IV BOLUS
INTRAVENOUS | Status: AC
  Filled 2017-02-24: qty 20

## 2017-02-24 MED ORDER — SODIUM CHLORIDE 0.9 % IR SOLN
Status: DC | PRN
  Administered 2017-02-24: 1000 mL

## 2017-02-24 MED ORDER — PROPOFOL 10 MG/ML IV BOLUS
INTRAVENOUS | Status: AC
  Filled 2017-02-24: qty 60

## 2017-02-24 MED ORDER — DIPHENHYDRAMINE HCL 25 MG PO CAPS: 25.0000 mg | ORAL_CAPSULE | Freq: Four times a day (QID) | ORAL | Status: DC | PRN

## 2017-02-24 MED ORDER — STERILE WATER FOR IRRIGATION IR SOLN
Status: DC | PRN
  Administered 2017-02-24: 2000 mL

## 2017-02-24 MED ORDER — MENTHOL 3 MG MT LOZG: 1.0000 | LOZENGE | OROMUCOSAL | Status: DC | PRN

## 2017-02-24 MED ORDER — SODIUM CHLORIDE 0.9 % IV SOLN
INTRAVENOUS | Status: DC
  Administered 2017-02-24: 17:00:00 via INTRAVENOUS

## 2017-02-24 MED ORDER — RIVAROXABAN 10 MG PO TABS: 10.0000 mg | ORAL_TABLET | Freq: Every day | ORAL | 0 refills | Status: DC

## 2017-02-24 MED ORDER — CEFAZOLIN SODIUM-DEXTROSE 2-4 GM/100ML-% IV SOLN
2.0000 g | Freq: Four times a day (QID) | INTRAVENOUS | Status: AC
  Administered 2017-02-24 (×2): 2 g via INTRAVENOUS
  Filled 2017-02-24 (×2): qty 100

## 2017-02-24 MED ORDER — ALPRAZOLAM 0.5 MG PO TABS
0.5000 mg | ORAL_TABLET | Freq: Two times a day (BID) | ORAL | Status: DC | PRN
  Administered 2017-02-24 (×2): 0.5 mg via ORAL
  Filled 2017-02-24 (×2): qty 1

## 2017-02-24 MED ORDER — FENTANYL CITRATE (PF) 100 MCG/2ML IJ SOLN
INTRAMUSCULAR | Status: AC
  Filled 2017-02-24: qty 2

## 2017-02-24 MED ORDER — METOCLOPRAMIDE HCL 5 MG PO TABS: 5.0000 mg | ORAL_TABLET | Freq: Three times a day (TID) | ORAL | Status: DC | PRN

## 2017-02-24 MED ORDER — ONDANSETRON HCL 4 MG PO TABS: 4.0000 mg | ORAL_TABLET | Freq: Four times a day (QID) | ORAL | Status: DC | PRN

## 2017-02-24 MED ORDER — FERROUS SULFATE 325 (65 FE) MG PO TABS: 325.0000 mg | ORAL_TABLET | Freq: Three times a day (TID) | ORAL | Status: DC

## 2017-02-24 MED ORDER — ALBUTEROL SULFATE (2.5 MG/3ML) 0.083% IN NEBU: 2.5000 mg | INHALATION_SOLUTION | Freq: Four times a day (QID) | RESPIRATORY_TRACT | Status: DC | PRN

## 2017-02-24 MED ORDER — POLYETHYLENE GLYCOL 3350 17 G PO PACK
17.0000 g | PACK | Freq: Two times a day (BID) | ORAL | Status: DC
  Administered 2017-02-24 – 2017-02-25 (×2): 17 g via ORAL
  Filled 2017-02-24 (×2): qty 1

## 2017-02-24 MED ORDER — ALUM & MAG HYDROXIDE-SIMETH 200-200-20 MG/5ML PO SUSP
15.0000 mL | ORAL | Status: DC | PRN
  Administered 2017-02-25 – 2017-02-26 (×2): 15 mL via ORAL
  Filled 2017-02-24 (×2): qty 30

## 2017-02-24 MED ORDER — FENTANYL CITRATE (PF) 100 MCG/2ML IJ SOLN
25.0000 ug | INTRAMUSCULAR | Status: DC | PRN
  Administered 2017-02-24 (×2): 50 ug via INTRAVENOUS

## 2017-02-24 MED ORDER — NICOTINE 21 MG/24HR TD PT24
21.0000 mg | MEDICATED_PATCH | Freq: Every day | TRANSDERMAL | Status: DC
  Administered 2017-02-24 – 2017-02-26 (×3): 21 mg via TRANSDERMAL
  Filled 2017-02-24 (×3): qty 1

## 2017-02-24 MED ORDER — HYDROMORPHONE HCL 1 MG/ML IJ SOLN
0.2500 mg | INTRAMUSCULAR | Status: DC | PRN
  Administered 2017-02-24 (×4): 0.5 mg via INTRAVENOUS

## 2017-02-24 MED ORDER — LIDOCAINE 2% (20 MG/ML) 5 ML SYRINGE
INTRAMUSCULAR | Status: DC | PRN
  Administered 2017-02-24: 100 mg via INTRAVENOUS

## 2017-02-24 MED ORDER — HYDROMORPHONE HCL 1 MG/ML IJ SOLN
0.5000 mg | INTRAMUSCULAR | Status: DC | PRN
  Administered 2017-02-24: 0.5 mg via INTRAVENOUS
  Filled 2017-02-24: qty 0.5

## 2017-02-24 MED ORDER — HYDROCODONE-ACETAMINOPHEN 7.5-325 MG PO TABS: 1.0000 | ORAL_TABLET | ORAL | 0 refills | Status: DC | PRN

## 2017-02-24 MED ORDER — HYDROMORPHONE HCL 1 MG/ML IJ SOLN
INTRAMUSCULAR | Status: AC
  Filled 2017-02-24: qty 2

## 2017-02-24 MED ORDER — PROMETHAZINE HCL 25 MG/ML IJ SOLN: 6.2500 mg | INTRAMUSCULAR | Status: DC | PRN

## 2017-02-24 MED ORDER — ONDANSETRON HCL 4 MG/2ML IJ SOLN
INTRAMUSCULAR | Status: AC
  Filled 2017-02-24: qty 2

## 2017-02-24 MED ORDER — ALBUTEROL SULFATE (2.5 MG/3ML) 0.083% IN NEBU
2.5000 mg | INHALATION_SOLUTION | Freq: Once | RESPIRATORY_TRACT | Status: AC
  Administered 2017-02-24: 2.5 mg via RESPIRATORY_TRACT

## 2017-02-24 MED ORDER — CHLORHEXIDINE GLUCONATE 4 % EX LIQD: 60.0000 mL | Freq: Once | CUTANEOUS | Status: DC

## 2017-02-24 MED ORDER — ONDANSETRON HCL 4 MG/2ML IJ SOLN: 4.0000 mg | Freq: Four times a day (QID) | INTRAMUSCULAR | Status: DC | PRN

## 2017-02-24 MED ORDER — METOPROLOL TARTRATE 50 MG PO TABS
50.0000 mg | ORAL_TABLET | Freq: Two times a day (BID) | ORAL | Status: DC
  Administered 2017-02-24 – 2017-02-25 (×2): 50 mg via ORAL
  Filled 2017-02-24 (×4): qty 1

## 2017-02-24 MED ORDER — METHOCARBAMOL 500 MG PO TABS
500.0000 mg | ORAL_TABLET | Freq: Four times a day (QID) | ORAL | 0 refills | Status: DC | PRN
Start: 2017-02-24 — End: 2017-06-10

## 2017-02-24 MED ORDER — BISACODYL 10 MG RE SUPP: 10.0000 mg | Freq: Every day | RECTAL | Status: DC | PRN

## 2017-02-24 MED ORDER — METOCLOPRAMIDE HCL 5 MG/ML IJ SOLN: 5.0000 mg | Freq: Three times a day (TID) | INTRAMUSCULAR | Status: DC | PRN

## 2017-02-24 MED ORDER — SODIUM CHLORIDE 0.9 % IV SOLN
1000.0000 mg | INTRAVENOUS | Status: AC
  Administered 2017-02-24: 1000 mg via INTRAVENOUS
  Filled 2017-02-24: qty 1100

## 2017-02-24 MED ORDER — HYDROCODONE-ACETAMINOPHEN 7.5-325 MG PO TABS
1.0000 | ORAL_TABLET | ORAL | Status: DC
  Administered 2017-02-24: 17:00:00 1 via ORAL
  Filled 2017-02-24: qty 1

## 2017-02-24 MED ORDER — PROPOFOL 10 MG/ML IV BOLUS
INTRAVENOUS | Status: DC | PRN
  Administered 2017-02-24 (×4): 20 mg via INTRAVENOUS

## 2017-02-24 MED ORDER — METHOCARBAMOL 500 MG PO TABS
500.0000 mg | ORAL_TABLET | Freq: Four times a day (QID) | ORAL | Status: DC | PRN
  Administered 2017-02-25 – 2017-02-26 (×4): 500 mg via ORAL
  Filled 2017-02-24 (×4): qty 1

## 2017-02-24 MED ORDER — FENTANYL CITRATE (PF) 100 MCG/2ML IJ SOLN: 25.0000 ug | INTRAMUSCULAR | Status: DC | PRN

## 2017-02-24 MED ORDER — ALBUTEROL SULFATE (2.5 MG/3ML) 0.083% IN NEBU
INHALATION_SOLUTION | RESPIRATORY_TRACT | Status: AC
  Filled 2017-02-24: qty 3

## 2017-02-24 MED ORDER — DEXAMETHASONE SODIUM PHOSPHATE 10 MG/ML IJ SOLN
INTRAMUSCULAR | Status: AC
  Filled 2017-02-24: qty 1

## 2017-02-24 MED ORDER — CEFAZOLIN SODIUM-DEXTROSE 2-4 GM/100ML-% IV SOLN
INTRAVENOUS | Status: AC
  Filled 2017-02-24: qty 100

## 2017-02-24 MED ORDER — RIVAROXABAN 10 MG PO TABS
10.0000 mg | ORAL_TABLET | ORAL | Status: DC
  Administered 2017-02-25 – 2017-02-26 (×2): 10 mg via ORAL
  Filled 2017-02-24 (×2): qty 1

## 2017-02-24 MED ORDER — DOCUSATE SODIUM 100 MG PO CAPS
100.0000 mg | ORAL_CAPSULE | Freq: Two times a day (BID) | ORAL | Status: DC
  Administered 2017-02-24 – 2017-02-26 (×4): 100 mg via ORAL
  Filled 2017-02-24 (×4): qty 1

## 2017-02-24 MED ORDER — ONDANSETRON HCL 4 MG/2ML IJ SOLN
INTRAMUSCULAR | Status: DC | PRN
Start: 2017-02-24 — End: 2017-02-27
  Administered 2017-02-24: 4 mg via INTRAVENOUS

## 2017-02-24 MED ORDER — TRANEXAMIC ACID 1000 MG/10ML IV SOLN
1000.0000 mg | Freq: Once | INTRAVENOUS | Status: AC
  Administered 2017-02-24: 17:00:00 1000 mg via INTRAVENOUS
  Filled 2017-02-24: qty 1100

## 2017-02-24 MED ORDER — LACTATED RINGERS IV SOLN
INTRAVENOUS | Status: DC
  Administered 2017-02-24 (×2): via INTRAVENOUS

## 2017-02-24 MED ORDER — LIDOCAINE 2% (20 MG/ML) 5 ML SYRINGE
INTRAMUSCULAR | Status: AC
  Filled 2017-02-24: qty 5

## 2017-02-24 MED ORDER — PROPOFOL 500 MG/50ML IV EMUL
INTRAVENOUS | Status: DC | PRN
  Administered 2017-02-24: 100 ug/kg/min via INTRAVENOUS

## 2017-02-24 MED ORDER — DEXAMETHASONE SODIUM PHOSPHATE 10 MG/ML IJ SOLN
10.0000 mg | Freq: Once | INTRAMUSCULAR | Status: AC
  Administered 2017-02-24: 10 mg via INTRAVENOUS

## 2017-02-24 MED ORDER — CEFAZOLIN SODIUM-DEXTROSE 2-4 GM/100ML-% IV SOLN
2.0000 g | INTRAVENOUS | Status: AC
  Administered 2017-02-24: 2 g via INTRAVENOUS

## 2017-02-24 MED ORDER — PHENOL 1.4 % MT LIQD: 1.0000 | OROMUCOSAL | Status: DC | PRN

## 2017-02-24 MED ORDER — CELECOXIB 200 MG PO CAPS
200.0000 mg | ORAL_CAPSULE | Freq: Two times a day (BID) | ORAL | Status: DC
  Administered 2017-02-24 – 2017-02-26 (×4): 200 mg via ORAL
  Filled 2017-02-24 (×4): qty 1

## 2017-02-24 MED ORDER — MIDAZOLAM HCL 5 MG/5ML IJ SOLN
INTRAMUSCULAR | Status: DC | PRN
  Administered 2017-02-24: 2 mg via INTRAVENOUS

## 2017-02-24 MED ORDER — DOCUSATE SODIUM 100 MG PO CAPS
100.0000 mg | ORAL_CAPSULE | Freq: Two times a day (BID) | ORAL | 0 refills | Status: DC
Start: 2017-02-24 — End: 2017-06-10

## 2017-02-24 SURGICAL SUPPLY — 37 items
BAG DECANTER FOR FLEXI CONT (MISCELLANEOUS) IMPLANT
BAG ZIPLOCK 12X15 (MISCELLANEOUS) IMPLANT
BLADE SAG 18X100X1.27 (BLADE) ×3 IMPLANT
CAPT HIP TOTAL 2 ×3 IMPLANT
CLOTH BEACON ORANGE TIMEOUT ST (SAFETY) ×3 IMPLANT
COVER PERINEAL POST (MISCELLANEOUS) ×3 IMPLANT
COVER SURGICAL LIGHT HANDLE (MISCELLANEOUS) ×3 IMPLANT
DERMABOND ADVANCED (GAUZE/BANDAGES/DRESSINGS) ×2
DERMABOND ADVANCED .7 DNX12 (GAUZE/BANDAGES/DRESSINGS) ×1 IMPLANT
DRAPE STERI IOBAN 125X83 (DRAPES) ×3 IMPLANT
DRAPE U-SHAPE 47X51 STRL (DRAPES) ×6 IMPLANT
DRESSING AQUACEL AG SP 3.5X10 (GAUZE/BANDAGES/DRESSINGS) ×1 IMPLANT
DRSG AQUACEL AG SP 3.5X10 (GAUZE/BANDAGES/DRESSINGS) ×3
DURAPREP 26ML APPLICATOR (WOUND CARE) ×3 IMPLANT
ELECT REM PT RETURN 15FT ADLT (MISCELLANEOUS) ×3 IMPLANT
GLOVE BIOGEL M STRL SZ7.5 (GLOVE) ×6 IMPLANT
GLOVE BIOGEL PI IND STRL 7.5 (GLOVE) ×2 IMPLANT
GLOVE BIOGEL PI IND STRL 8.5 (GLOVE) IMPLANT
GLOVE BIOGEL PI INDICATOR 7.5 (GLOVE) ×4
GLOVE BIOGEL PI INDICATOR 8.5 (GLOVE)
GLOVE ECLIPSE 8.0 STRL XLNG CF (GLOVE) IMPLANT
GLOVE ORTHO TXT STRL SZ7.5 (GLOVE) ×3 IMPLANT
GOWN STRL REUS W/TWL LRG LVL3 (GOWN DISPOSABLE) ×3 IMPLANT
GOWN STRL REUS W/TWL XL LVL3 (GOWN DISPOSABLE) ×3 IMPLANT
HOLDER FOLEY CATH W/STRAP (MISCELLANEOUS) ×3 IMPLANT
PACK ANTERIOR HIP CUSTOM (KITS) ×3 IMPLANT
SUT MNCRL AB 4-0 PS2 18 (SUTURE) ×3 IMPLANT
SUT STRATAFIX 0 PDS 27 VIOLET (SUTURE)
SUT VIC AB 1 CT1 36 (SUTURE) ×9 IMPLANT
SUT VIC AB 2-0 CT1 27 (SUTURE) ×4
SUT VIC AB 2-0 CT1 TAPERPNT 27 (SUTURE) ×2 IMPLANT
SUT VLOC 180 0 24IN GS25 (SUTURE) ×3 IMPLANT
SUTURE STRATFX 0 PDS 27 VIOLET (SUTURE) IMPLANT
TRAY FOLEY CATH 14FRSI W/METER (CATHETERS) ×3 IMPLANT
TRAY FOLEY W/METER SILVER 16FR (SET/KITS/TRAYS/PACK) IMPLANT
WATER STERILE IRR 1500ML POUR (IV SOLUTION) ×3 IMPLANT
YANKAUER SUCT BULB TIP 10FT TU (MISCELLANEOUS) ×3 IMPLANT

## 2017-02-24 NOTE — Discharge Instructions (Addendum)
INSTRUCTIONS AFTER JOINT REPLACEMENT  ° °o Remove items at home which could result in a fall. This includes throw rugs or furniture in walking pathways °o ICE to the affected joint every three hours while awake for 30 minutes at a time, for at least the first 3-5 days, and then as needed for pain and swelling.  Continue to use ice for pain and swelling. You may notice swelling that will progress down to the foot and ankle.  This is normal after surgery.  Elevate your leg when you are not up walking on it.   °o Continue to use the breathing machine you got in the hospital (incentive spirometer) which will help keep your temperature down.  It is common for your temperature to cycle up and down following surgery, especially at night when you are not up moving around and exerting yourself.  The breathing machine keeps your lungs expanded and your temperature down. ° ° °DIET:  As you were doing prior to hospitalization, we recommend a well-balanced diet. ° °DRESSING / WOUND CARE / SHOWERING ° °Keep the surgical dressing until follow up.  The dressing is water proof, so you can shower without any extra covering.  IF THE DRESSING FALLS OFF or the wound gets wet inside, change the dressing with sterile gauze.  Please use good hand washing techniques before changing the dressing.  Do not use any lotions or creams on the incision until instructed by your surgeon.   ° °ACTIVITY ° °o Increase activity slowly as tolerated, but follow the weight bearing instructions below.   °o No driving for 6 weeks or until further direction given by your physician.  You cannot drive while taking narcotics.  °o No lifting or carrying greater than 10 lbs. until further directed by your surgeon. °o Avoid periods of inactivity such as sitting longer than an hour when not asleep. This helps prevent blood clots.  °o You may return to work once you are authorized by your doctor.  ° ° ° °WEIGHT BEARING  ° °Weight bearing as tolerated with assist  device (walker, cane, etc) as directed, use it as long as suggested by your surgeon or therapist, typically at least 4-6 weeks. ° ° °EXERCISES ° °Results after joint replacement surgery are often greatly improved when you follow the exercise, range of motion and muscle strengthening exercises prescribed by your doctor. Safety measures are also important to protect the joint from further injury. Any time any of these exercises cause you to have increased pain or swelling, decrease what you are doing until you are comfortable again and then slowly increase them. If you have problems or questions, call your caregiver or physical therapist for advice.  ° °Rehabilitation is important following a joint replacement. After just a few days of immobilization, the muscles of the leg can become weakened and shrink (atrophy).  These exercises are designed to build up the tone and strength of the thigh and leg muscles and to improve motion. Often times heat used for twenty to thirty minutes before working out will loosen up your tissues and help with improving the range of motion but do not use heat for the first two weeks following surgery (sometimes heat can increase post-operative swelling).  ° °These exercises can be done on a training (exercise) mat, on the floor, on a table or on a bed. Use whatever works the best and is most comfortable for you.    Use music or television while you are exercising so that   the exercises are a pleasant break in your day. This will make your life better with the exercises acting as a break in your routine that you can look forward to.   Perform all exercises about fifteen times, three times per day or as directed.  You should exercise both the operative leg and the other leg as well. ° °Exercises include: °  °• Quad Sets - Tighten up the muscle on the front of the thigh (Quad) and hold for 5-10 seconds.   °• Straight Leg Raises - With your knee straight (if you were given a brace, keep it on),  lift the leg to 60 degrees, hold for 3 seconds, and slowly lower the leg.  Perform this exercise against resistance later as your leg gets stronger.  °• Leg Slides: Lying on your back, slowly slide your foot toward your buttocks, bending your knee up off the floor (only go as far as is comfortable). Then slowly slide your foot back down until your leg is flat on the floor again.  °• Angel Wings: Lying on your back spread your legs to the side as far apart as you can without causing discomfort.  °• Hamstring Strength:  Lying on your back, push your heel against the floor with your leg straight by tightening up the muscles of your buttocks.  Repeat, but this time bend your knee to a comfortable angle, and push your heel against the floor.  You may put a pillow under the heel to make it more comfortable if necessary.  ° °A rehabilitation program following joint replacement surgery can speed recovery and prevent re-injury in the future due to weakened muscles. Contact your doctor or a physical therapist for more information on knee rehabilitation.  ° ° °CONSTIPATION ° °Constipation is defined medically as fewer than three stools per week and severe constipation as less than one stool per week.  Even if you have a regular bowel pattern at home, your normal regimen is likely to be disrupted due to multiple reasons following surgery.  Combination of anesthesia, postoperative narcotics, change in appetite and fluid intake all can affect your bowels.  ° °YOU MUST use at least one of the following options; they are listed in order of increasing strength to get the job done.  They are all available over the counter, and you may need to use some, POSSIBLY even all of these options:   ° °Drink plenty of fluids (prune juice may be helpful) and high fiber foods °Colace 100 mg by mouth twice a day  °Senokot for constipation as directed and as needed Dulcolax (bisacodyl), take with full glass of water  °Miralax (polyethylene glycol)  once or twice a day as needed. ° °If you have tried all these things and are unable to have a bowel movement in the first 3-4 days after surgery call either your surgeon or your primary doctor.   ° °If you experience loose stools or diarrhea, hold the medications until you stool forms back up.  If your symptoms do not get better within 1 week or if they get worse, check with your doctor.  If you experience "the worst abdominal pain ever" or develop nausea or vomiting, please contact the office immediately for further recommendations for treatment. ° ° °ITCHING:  If you experience itching with your medications, try taking only a single pain pill, or even half a pain pill at a time.  You can also use Benadryl over the counter for itching or also to   help with sleep.  ° °TED HOSE STOCKINGS:  Use stockings on both legs until for at least 2 weeks or as directed by physician office. They may be removed at night for sleeping. ° °MEDICATIONS:  See your medication summary on the “After Visit Summary” that nursing will review with you.  You may have some home medications which will be placed on hold until you complete the course of blood thinner medication.  It is important for you to complete the blood thinner medication as prescribed. ° °PRECAUTIONS:  If you experience chest pain or shortness of breath - call 911 immediately for transfer to the hospital emergency department.  ° °If you develop a fever greater that 101 F, purulent drainage from wound, increased redness or drainage from wound, foul odor from the wound/dressing, or calf pain - CONTACT YOUR SURGEON.   °                                                °FOLLOW-UP APPOINTMENTS:  If you do not already have a post-op appointment, please call the office for an appointment to be seen by your surgeon.  Guidelines for how soon to be seen are listed in your “After Visit Summary”, but are typically between 1-4 weeks after surgery. ° °OTHER INSTRUCTIONS:  ° °Knee  Replacement:  Do not place pillow under knee, focus on keeping the knee straight while resting.  ° °MAKE SURE YOU:  °• Understand these instructions.  °• Get help right away if you are not doing well or get worse.  ° ° °Thank you for letting us be a part of your medical care team.  It is a privilege we respect greatly.  We hope these instructions will help you stay on track for a fast and full recovery!  ° _________________________ ° °Information on my medicine - XARELTO® (Rivaroxaban) ° °This medication education was reviewed with me or my healthcare representative as part of my discharge preparation.  ° °Why was Xarelto® prescribed for you? °Xarelto® was prescribed for you to reduce the risk of blood clots forming after orthopedic surgery. The medical term for these abnormal blood clots is venous thromboembolism (VTE). ° °What do you need to know about xarelto® ? °Take your Xarelto® ONCE DAILY at the same time every day. °You may take it either with or without food. ° °If you have difficulty swallowing the tablet whole, you may crush it and mix in applesauce just prior to taking your dose. ° °Take Xarelto® exactly as prescribed by your doctor and DO NOT stop taking Xarelto® without talking to the doctor who prescribed the medication.  Stopping without other VTE prevention medication to take the place of Xarelto® may increase your risk of developing a clot. ° °After discharge, you should have regular check-up appointments with your healthcare provider that is prescribing your Xarelto®.   ° °What do you do if you miss a dose? °If you miss a dose, take it as soon as you remember on the same day then continue your regularly scheduled once daily regimen the next day. Do not take two doses of Xarelto® on the same day.  ° °Important Safety Information °A possible side effect of Xarelto® is bleeding. You should call your healthcare provider right away if you experience any of the following: °? Bleeding from an injury or  your nose that   does not stop. °? Unusual colored urine (red or dark brown) or unusual colored stools (red or black). °? Unusual bruising for unknown reasons. °? A serious fall or if you hit your head (even if there is no bleeding). ° °Some medicines may interact with Xarelto® and might increase your risk of bleeding while on Xarelto®. To help avoid this, consult your healthcare provider or pharmacist prior to using any new prescription or non-prescription medications, including herbals, vitamins, non-steroidal anti-inflammatory drugs (NSAIDs) and supplements. ° °This website has more information on Xarelto®: www.xarelto.com. ° ° °

## 2017-02-24 NOTE — Progress Notes (Signed)
Dr Conrad Sheldon at bedside.  Ok with d/c to room at this time

## 2017-02-24 NOTE — Anesthesia Procedure Notes (Signed)
Spinal  Patient location during procedure: OR Staffing Anesthesiologist: Chonte Ricke Performed: anesthesiologist  Preanesthetic Checklist Completed: patient identified, site marked, surgical consent, pre-op evaluation, timeout performed, IV checked, risks and benefits discussed and monitors and equipment checked Spinal Block Patient position: sitting Prep: Betadine Patient monitoring: heart rate, continuous pulse ox and blood pressure Injection technique: single-shot Needle Needle type: [place]  Needle gauge: 22 G Needle length: 9 cm Additional Notes Expiration date of kit checked and confirmed. Patient tolerated procedure well, without complications.

## 2017-02-24 NOTE — Transfer of Care (Signed)
Immediate Anesthesia Transfer of Care Note  Patient: Meredith Church  Procedure(s) Performed: Procedure(s): RIGHT TOTAL HIP ARTHROPLASTY ANTERIOR APPROACH (Right)  Patient Location: PACU  Anesthesia Type:Spinal  Level of Consciousness: awake and oriented  Airway & Oxygen Therapy: Patient Spontanous Breathing and Patient connected to face mask oxygen  Post-op Assessment: Report given to RN and Post -op Vital signs reviewed and stable  Post vital signs: Reviewed and stable  Last Vitals:  Vitals:   02/24/17 0901  BP: (!) 119/92  Pulse: 90  Resp: 16  Temp: 37 C    Last Pain:  Vitals:   02/24/17 0940  TempSrc:   PainSc: 9          Complications: No apparent anesthesia complications

## 2017-02-24 NOTE — Interval H&P Note (Signed)
History and Physical Interval Note:  02/24/2017 10:11 AM  Meredith Church  has presented today for surgery, with the diagnosis of Right hip osteoarthritis  The various methods of treatment have been discussed with the patient and family. After consideration of risks, benefits and other options for treatment, the patient has consented to  Procedure(s): RIGHT TOTAL HIP ARTHROPLASTY ANTERIOR APPROACH (Right) as a surgical intervention .  The patient's history has been reviewed, patient examined, no change in status, stable for surgery.  I have reviewed the patient's chart and labs.  Questions were answered to the patient's satisfaction.     Mauri Pole

## 2017-02-24 NOTE — Anesthesia Preprocedure Evaluation (Signed)
Anesthesia Evaluation  Patient identified by MRN, date of birth, ID band Patient awake    Reviewed: Allergy & Precautions, NPO status , Patient's Chart, lab work & pertinent test results  Airway Mallampati: II  TM Distance: >3 FB Neck ROM: Full    Dental no notable dental hx.    Pulmonary COPD, former smoker,    Pulmonary exam normal breath sounds clear to auscultation       Cardiovascular hypertension, Normal cardiovascular exam Rhythm:Regular Rate:Normal     Neuro/Psych negative neurological ROS  negative psych ROS   GI/Hepatic negative GI ROS, Neg liver ROS,   Endo/Other  negative endocrine ROS  Renal/GU negative Renal ROS  negative genitourinary   Musculoskeletal negative musculoskeletal ROS (+)   Abdominal   Peds negative pediatric ROS (+)  Hematology negative hematology ROS (+)   Anesthesia Other Findings   Reproductive/Obstetrics negative OB ROS                             Anesthesia Physical Anesthesia Plan  ASA: II  Anesthesia Plan: Spinal   Post-op Pain Management:    Induction: Intravenous  PONV Risk Score and Plan: 1 and Ondansetron and Dexamethasone  Airway Management Planned: Simple Face Mask  Additional Equipment:   Intra-op Plan:   Post-operative Plan:   Informed Consent: I have reviewed the patients History and Physical, chart, labs and discussed the procedure including the risks, benefits and alternatives for the proposed anesthesia with the patient or authorized representative who has indicated his/her understanding and acceptance.   Dental advisory given  Plan Discussed with: CRNA and Surgeon  Anesthesia Plan Comments:         Anesthesia Quick Evaluation

## 2017-02-24 NOTE — Op Note (Addendum)
NAME:  Meredith Church NO.: [id number]      MEDICAL RECORD NO.: [id number]      FACILITY:  [facility]      PHYSICIAN:  Paralee Cancel D  DATE OF BIRTH:  [birth year]     DATE OF PROCEDURE:  02/24/2017                                 OPERATIVE REPORT         PREOPERATIVE DIAGNOSIS: Right  hip osteoarthritis.      POSTOPERATIVE DIAGNOSIS:  Right hip osteoarthritis.      PROCEDURE:  Right total hip replacement through an anterior approach   utilizing DePuy THR system, component size 50 mm pinnacle cup, a size 32+4 neutral   Altrex liner, a size 4 Hi Tri Lock stem with a 32+1 delta ceramic   ball.      SURGEON:  Pietro Cassis. Alvan Dame, M.D.      ASSISTANT:  Nehemiah Massed, PA-C     ANESTHESIA:  Spinal.      SPECIMENS:  None.      COMPLICATIONS:  None.      BLOOD LOSS:  200 cc     DRAINS:  None.      INDICATION OF THE PROCEDURE:  Meredith Church is a 61 y.o. female who had   presented to office for evaluation of right hip pain.  Radiographs revealed   progressive degenerative changes with bone-on-bone   articulation to the  hip joint.  The patient had painful limited range of   motion significantly affecting their overall quality of life.  The patient was failing to    respond to conservative measures, and at this point was ready   to proceed with more definitive measures.  The patient has noted progressive   degenerative changes in his hip, progressive problems and dysfunction   with regarding the hip prior to surgery.  Consent was obtained for   benefit of pain relief.  Specific risk of infection, DVT, component   failure, dislocation, need for revision surgery, as well discussion of   the anterior versus posterior approach were reviewed.  Consent was   obtained for benefit of anterior pain relief through an anterior   approach.      PROCEDURE IN DETAIL:  The patient was brought to operative theater.   Once adequate anesthesia,  preoperative antibiotics, 2 gm of Ancef, 1 gm of Tranexamic Acid, and 10 mg of Decadron administered.   The patient was positioned supine on the OSI Hanna table.  Once adequate   padding of boney process was carried out, we had predraped out the hip, and  used fluoroscopy to confirm orientation of the pelvis and position.      The right hip was then prepped and draped from proximal iliac crest to   mid thigh with shower curtain technique.      Time-out was performed identifying the patient, planned procedure, and   extremity.     An incision was then made 2 cm distal and lateral to the   anterior superior iliac spine extending over the orientation of the   tensor fascia lata muscle and sharp dissection was carried down to the   fascia of the muscle and protractor placed in the soft tissues.      The  fascia was then incised.  The muscle belly was identified and swept   laterally and retractor placed along the superior neck.  Following   cauterization of the circumflex vessels and removing some pericapsular   fat, a second cobra retractor was placed on the inferior neck.  A third   retractor was placed on the anterior acetabulum after elevating the   anterior rectus.  A L-capsulotomy was along the line of the   superior neck to the trochanteric fossa, then extended proximally and   distally.  Tag sutures were placed and the retractors were then placed   intracapsular.  We then identified the trochanteric fossa and   orientation of my neck cut, confirmed this radiographically   and then made a neck osteotomy with the femur on traction.  The femoral   head was removed without difficulty or complication.  Traction was let   off and retractors were placed posterior and anterior around the   acetabulum.      The labrum and foveal tissue were debrided.  I began reaming with a 20mm   reamer and reamed up to 24mm reamer with good bony bed preparation and a 4mm   cup was chosen.  The final  32mm Pinnacle cup was then impacted under fluoroscopy  to confirm the depth of penetration and orientation with respect to   abduction.  A screw was placed followed by the hole eliminator.  The final   32+4 neutral Altrex liner was impacted with good visualized rim fit.  The cup was positioned anatomically within the acetabular portion of the pelvis.      At this point, the femur was rolled at 80 degrees.  Further capsule was   released off the inferior aspect of the femoral neck.  I then   released the superior capsule proximally.  The hook was placed laterally   along the femur and elevated manually and held in position with the bed   hook.  The leg was then extended and adducted with the leg rolled to 100   degrees of external rotation.  Once the proximal femur was fully   exposed, I used a box osteotome to set orientation.  I then began   broaching with the starting chili pepper broach and passed this by hand and then broached up to 4.  With the 4 broach in place I chose a high offset neck and did several trial reductions.  The offset was appropriate, leg lengths   appeared to be equal best matched with the +1 head ball confirmed radiographically.   Given these findings, I went ahead and dislocated the hip, repositioned all   retractors and positioned the right hip in the extended and abducted position.  The final 4 Hi Tri Lock stem was   chosen and it was impacted down to the level of neck cut.  Based on this   and the trial reduction, a 32+1 delta ceramic ball was chosen and   impacted onto a clean and dry trunnion, and the hip was reduced.  The   hip had been irrigated throughout the case again at this point.  I did   reapproximate the superior capsular leaflet to the anterior leaflet   using #1 Vicryl.  The fascia of the   tensor fascia lata muscle was then reapproximated using #1 Vicryl and #0 V-lock sutures.  The   remaining wound was closed with 2-0 Vicryl and running 4-0 Monocryl.    The hip was cleaned,  dried, and dressed sterilely using Dermabond and   Aquacel dressing.  She was then brought   to recovery room in stable condition tolerating the procedure well.    Nehemiah Massed, PA-C was present for the entirety of the case involved from   preoperative positioning, perioperative retractor management, general   facilitation of the case, as well as primary wound closure as assistant.            Pietro Cassis Alvan Dame, M.D.        02/24/2017 12:47 PM

## 2017-02-25 ENCOUNTER — Encounter (HOSPITAL_COMMUNITY): Payer: Self-pay | Admitting: Orthopedic Surgery

## 2017-02-25 DIAGNOSIS — E669 Obesity, unspecified: Secondary | ICD-10-CM | POA: Diagnosis present

## 2017-02-25 LAB — BASIC METABOLIC PANEL
Anion gap: 8 (ref 5–15)
BUN: 7 mg/dL (ref 6–20)
CO2: 25 mmol/L (ref 22–32)
Calcium: 8.7 mg/dL — ABNORMAL LOW (ref 8.9–10.3)
Chloride: 104 mmol/L (ref 101–111)
Creatinine, Ser: 0.51 mg/dL (ref 0.44–1.00)
GFR calc Af Amer: >60 mL/min (ref >60)
GFR calc non Af Amer: >60 mL/min (ref >60)
Glucose, Bld: 132 mg/dL — ABNORMAL HIGH (ref 65–99)
Potassium: 4 mmol/L (ref 3.5–5.1)
Sodium: 137 mmol/L (ref 135–145)

## 2017-02-25 LAB — CBC
HCT: 38.3 % (ref 36.0–46.0)
Hemoglobin: 12.8 g/dL (ref 12.0–15.0)
MCH: 33.1 pg (ref 26.0–34.0)
MCHC: 33.4 g/dL (ref 30.0–36.0)
MCV: 99 fL (ref 78.0–100.0)
Platelets: 225 10*3/uL (ref 150–400)
RBC: 3.87 MIL/uL (ref 3.87–5.11)
RDW: 14.1 % (ref 11.5–15.5)
WBC: 10.5 10*3/uL (ref 4.0–10.5)

## 2017-02-25 MED ORDER — OXYCODONE HCL 5 MG PO TABS
5.0000 mg | ORAL_TABLET | ORAL | Status: DC | PRN
  Administered 2017-02-25 – 2017-02-26 (×4): 10 mg via ORAL
  Administered 2017-02-26: 5 mg via ORAL
  Filled 2017-02-25 (×5): qty 2

## 2017-02-25 NOTE — Progress Notes (Signed)
Physical Therapy Treatment Patient Details Name: Meredith Church MRN: [id number] DOB: May 13, 1956 Today's Date: 02/25/2017    History of Present Illness 61 yo female s/p R THA-direct anterior 02/24/17.  H/o back sx and sciatica and anxiety    PT Comments    Progressing with mobility. Plan is for possible d/c on tomorrow.    Follow Up Recommendations  DC plan and follow up therapy as arranged by surgeon;Supervision - Intermittent     Equipment Recommendations  Rolling walker with 5" wheels    Recommendations for Other Services       Precautions / Restrictions Precautions Precautions: Fall Precaution Comments: pt still has some back pain and sciatica pain at times Restrictions Weight Bearing Restrictions: No    Mobility  Bed Mobility Overal bed mobility: Needs Assistance Bed Mobility: Sit to Supine       Sit to supine: Min guard   General bed mobility comments: for safety  Transfers Overall transfer level: Needs assistance Equipment used: Rolling walker (2 wheeled) Transfers: Sit to/from Stand Sit to Stand: Min guard         General transfer comment: close guard for safety. VCs safety, hand placement  Ambulation/Gait Ambulation/Gait assistance: Min guard Ambulation Distance (Feet): 150 Feet Assistive device: Rolling walker (2 wheeled) Gait Pattern/deviations: Step-to pattern;Step-through pattern;Decreased stride length     General Gait Details: close guard for safety. slow gait speed.    Stairs            Wheelchair Mobility    Modified Rankin (Stroke Patients Only)       Balance                                            Cognition Arousal/Alertness: Awake/alert Behavior During Therapy: WFL for tasks assessed/performed Overall Cognitive Status: Within Functional Limits for tasks assessed                                        Exercises Total Joint Exercises Ankle Circles/Pumps: AROM;Both;10  reps;Supine Quad Sets: AROM;Both;10 reps;Supine Heel Slides: Right;10 reps;Supine;AAROM Hip ABduction/ADduction: AAROM;Right;10 reps;Supine    General Comments        Pertinent Vitals/Pain Pain Assessment: 0-10 Faces Pain Scale: Hurts little more Pain Location: R hip, back Pain Descriptors / Indicators: Sore Pain Intervention(s): Monitored during session;Repositioned;Ice applied    Home Living                      Prior Function            PT Goals (current goals can now be found in the care plan section) Acute Rehab PT Goals Patient Stated Goal: home PT Goal Formulation: With patient Time For Goal Achievement: 03/11/17 Potential to Achieve Goals: Good Progress towards PT goals: Progressing toward goals    Frequency    7X/week      PT Plan Current plan remains appropriate    Co-evaluation              AM-PAC PT "6 Clicks" Daily Activity  Outcome Measure  Difficulty turning over in bed (including adjusting bedclothes, sheets and blankets)?: A Little Difficulty moving from lying on back to sitting on the side of the bed? : A Little Difficulty sitting down on and standing up from a  chair with arms (e.g., wheelchair, bedside commode, etc,.)?: A Little Help needed moving to and from a bed to chair (including a wheelchair)?: A Little Help needed walking in hospital room?: A Little Help needed climbing 3-5 steps with a railing? : A Little 6 Click Score: 18    End of Session Equipment Utilized During Treatment: Gait belt Activity Tolerance: Patient tolerated treatment well Patient left: in bed;with call bell/phone within reach;with bed alarm set   PT Visit Diagnosis: Muscle weakness (generalized) (M62.81);Difficulty in walking, not elsewhere classified (R26.2)     Time: 2248-2500 PT Time Calculation (min) (ACUTE ONLY): 18 min  Charges:  $Gait Training: 8-22 mins                    G Codes:          Weston Anna, MPT Pager:  [phone]

## 2017-02-25 NOTE — Progress Notes (Addendum)
Dishcharge plan:  Patient is to discharge with no therapy. She has a 3in1 and walker at home. Requesting tub seat. Order received and [facility] rep alerted. Tub seat to be shipped to home. Marney Doctor RN,BSN,NCM [phone]

## 2017-02-25 NOTE — Evaluation (Signed)
Physical Therapy Evaluation Patient Details Name: Meredith Church MRN: [id number] DOB: 02-15-1956 Today's Date: 02/25/2017   History of Present Illness  61 yo female s/p R THA-direct anterior 02/24/17.  H/o back sx and sciatica and anxiety  Clinical Impression  On eval, pt was Min guard assist for mobility. She walked ~125 feet with a RW. Minimal pain during session. Will continue to progress as tolerated.     Follow Up Recommendations DC plan and follow up therapy as arranged by surgeon;Supervision - Intermittent    Equipment Recommendations  Rolling walker with 5" wheels    Recommendations for Other Services       Precautions / Restrictions Precautions Precautions: Fall Precaution Comments: pt still has some back pain and sciatica pain at times Restrictions Weight Bearing Restrictions: No      Mobility  Bed Mobility Overal bed mobility: Needs Assistance Bed Mobility: Supine to Sit     Supine to sit: Supervision     General bed mobility comments: oob in recliner  Transfers Overall transfer level: Needs assistance Equipment used: Rolling walker (2 wheeled) Transfers: Sit to/from Stand Sit to Stand: Min guard         General transfer comment: close guard for safety. VCs safety, hand placement  Ambulation/Gait Ambulation/Gait assistance: Min guard Ambulation Distance (Feet): 125 Feet Assistive device: Rolling walker (2 wheeled) Gait Pattern/deviations: Step-to pattern;Step-through pattern;Decreased stride length     General Gait Details: close guard for safety. slow gait speed.   Stairs            Wheelchair Mobility    Modified Rankin (Stroke Patients Only)       Balance                                             Pertinent Vitals/Pain Pain Assessment: 0-10 Faces Pain Scale: Hurts a little bit Pain Location: R hip Pain Descriptors / Indicators: Sore Pain Intervention(s): Monitored during session;Repositioned    Home  Living Family/patient expects to be discharged to:: Private residence Living Arrangements: Alone Available Help at Discharge: Friend(s);Available PRN/intermittently Type of Home: House Home Access: Stairs to enter Entrance Stairs-Rails: Right Entrance Stairs-Number of Steps: 3 Home Layout: One level Home Equipment: Grab bars - tub/shower;Bedside commode      Prior Function Level of Independence: Independent               Hand Dominance        Extremity/Trunk Assessment   Upper Extremity Assessment Upper Extremity Assessment: Defer to OT evaluation    Lower Extremity Assessment Lower Extremity Assessment: Generalized weakness (s/p R THA)    Cervical / Trunk Assessment Cervical / Trunk Assessment: Normal  Communication   Communication: No difficulties  Cognition Arousal/Alertness: Awake/alert Behavior During Therapy: WFL for tasks assessed/performed Overall Cognitive Status: Within Functional Limits for tasks assessed                                        General Comments      Exercises Total Joint Exercises Ankle Circles/Pumps: AROM;Both;10 reps;Supine Quad Sets: AROM;Both;10 reps;Supine Heel Slides: Right;10 reps;Supine;AAROM Hip ABduction/ADduction: AAROM;Right;10 reps;Supine   Assessment/Plan    PT Assessment Patient needs continued PT services  PT Problem List Decreased mobility;Decreased range of motion;Decreased strength;Decreased activity tolerance;Decreased balance;Decreased knowledge of  use of DME;Pain       PT Treatment Interventions DME instruction;Therapeutic activities;Gait training;Therapeutic exercise;Patient/family education;Functional mobility training;Balance training;Stair training    PT Goals (Current goals can be found in the Care Plan section)  Acute Rehab PT Goals Patient Stated Goal: home PT Goal Formulation: With patient Time For Goal Achievement: 03/11/17 Potential to Achieve Goals: Good    Frequency  7X/week   Barriers to discharge        Co-evaluation               AM-PAC PT "6 Clicks" Daily Activity  Outcome Measure Difficulty turning over in bed (including adjusting bedclothes, sheets and blankets)?: A Little Difficulty moving from lying on back to sitting on the side of the bed? : A Little Difficulty sitting down on and standing up from a chair with arms (e.g., wheelchair, bedside commode, etc,.)?: A Little Help needed moving to and from a bed to chair (including a wheelchair)?: A Little Help needed walking in hospital room?: A Little Help needed climbing 3-5 steps with a railing? : A Little 6 Click Score: 18    End of Session Equipment Utilized During Treatment: Gait belt Activity Tolerance: Patient tolerated treatment well Patient left: in chair;with call bell/phone within reach;with chair alarm set   PT Visit Diagnosis: Muscle weakness (generalized) (M62.81);Difficulty in walking, not elsewhere classified (R26.2)    Time: 4196-2229 PT Time Calculation (min) (ACUTE ONLY): 26 min   Charges:   PT Evaluation $PT Eval Low Complexity: 1 Procedure PT Treatments $Gait Training: 8-22 mins   PT G Codes:          Weston Anna, MPT Pager: [phone]

## 2017-02-25 NOTE — Progress Notes (Signed)
     Subjective: 1 Day Post-Op Procedure(s) (LRB): RIGHT TOTAL HIP ARTHROPLASTY ANTERIOR APPROACH (Right)   Patient reports pain as mild, pain controlled. No events throughout the night. Plan for discharge tomorrow due to the need for inpatient therapy to meet goal of being discharged home safely with family/caregiver.   Objective:   VITALS:   Vitals:   02/25/17 0126 02/25/17 0454  BP: 130/71 113/68  Pulse: 73 74  Resp: 16 16  Temp: 97.7 F (36.5 C) 98.2 F (36.8 C)    Dorsiflexion/Plantar flexion intact Incision: dressing C/D/I No cellulitis present Compartment soft  LABS  Recent Labs  02/25/17 0512  HGB 12.8  HCT 38.3  WBC 10.5  PLT 225     Recent Labs  02/25/17 0512  NA 137  K 4.0  BUN 7  CREATININE 0.51  GLUCOSE 132*     Assessment/Plan: 1 Day Post-Op Procedure(s) (LRB): RIGHT TOTAL HIP ARTHROPLASTY ANTERIOR APPROACH (Right) Foley cath d/c'ed Advance diet Up with therapy D/C IV fluids Discharge home probably tomorrow  Obese (BMI 30-39.9) Estimated body mass index is 30.04 kg/m as calculated from the following:   Height as of this encounter: 5\' 4"  (1.626 m).   Weight as of this encounter: 79.4 kg (175 lb). Patient also counseled that weight may inhibit the healing process Patient counseled that losing weight will help with future health issues       West Pugh. Vaanya Shambaugh   PAC  02/25/2017, 8:40 AM

## 2017-02-25 NOTE — Evaluation (Signed)
Occupational Therapy Evaluation Patient Details Name: Meredith Church MRN: [id number] DOB: [birth year] Today's Date: 02/25/2017    History of Present Illness pt was admitted for R DA THA.  H/o back sx and sciatica and anxiety   Clinical Impression   This 60 year old female was admitted for the above sx. She will benefit from continued OT in acute to increase safety and independence with adls. Pt will have intermittent assistance at home.  She needs up to max A for LB adls and goals are for supervision with AE.    Follow Up Recommendations  Supervision - Intermittent    Equipment Recommendations  Tub/shower seat    Recommendations for Other Services       Precautions / Restrictions Precautions Precautions: Fall Precaution Comments: pt still has some back pain and sciatica pain at times Restrictions Weight Bearing Restrictions: No      Mobility Bed Mobility Overal bed mobility: Needs Assistance Bed Mobility: Supine to Sit     Supine to sit: Supervision     General bed mobility comments: extra time, HOB raised ans use of rails  Transfers Overall transfer level: Needs assistance Equipment used: Rolling walker (2 wheeled) Transfers: Sit to/from Stand Sit to Stand: Min guard         General transfer comment: for safety. Cues for UE/LE placement    Balance                                           ADL either performed or assessed with clinical judgement   ADL Overall ADL's : Needs assistance/impaired Eating/Feeding: Independent;Bed level   Grooming: Wash/dry hands;Wash/dry face;Oral care;Standing;Supervision/safety   Upper Body Bathing: Set up;Sitting   Lower Body Bathing: Sit to/from stand;Moderate assistance   Upper Body Dressing : Set up;Sitting   Lower Body Dressing: Maximal assistance;Bed level   Toilet Transfer: Min guard;Ambulation;BSC;RW   Toileting- Clothing Manipulation and Hygiene: Supervision/safety;Sit to/from stand         General ADL Comments: pt does not have any AE.  She was able to cross legs for adls prior to sx with extra time and effort.  Will provide this forr her on next visit.  She is familiar with tub bench and doesn't think this will work for her. She is interested in a tub seat     Vision         Perception     Praxis      Pertinent Vitals/Pain Pain Assessment: Faces Faces Pain Scale: Hurts a little bit Pain Location: R hip Pain Descriptors / Indicators: Sore Pain Intervention(s): Limited activity within patient's tolerance;Repositioned;Premedicated before session;Monitored during session     Hand Dominance     Extremity/Trunk Assessment Upper Extremity Assessment Upper Extremity Assessment: Overall WFL for tasks assessed           Communication Communication Communication: No difficulties   Cognition Arousal/Alertness: Awake/alert Behavior During Therapy: WFL for tasks assessed/performed Overall Cognitive Status: Within Functional Limits for tasks assessed                                     General Comments       Exercises     Shoulder Instructions      Home Living Family/patient expects to be discharged to:: Private residence Living Arrangements: Alone Available  Help at Discharge: Friend(s);Available PRN/intermittently               Bathroom Shower/Tub: Tub/shower unit   Bathroom Toilet: Handicapped height     Home Equipment: Grab bars - tub/shower;Bedside commode          Prior Functioning/Environment Level of Independence: Independent                 OT Problem List: Decreased strength;Decreased activity tolerance;Decreased knowledge of use of DME or AE;Pain      OT Treatment/Interventions: Self-care/ADL training;DME and/or AE instruction;Patient/family education    OT Goals(Current goals can be found in the care plan section) Acute Rehab OT Goals Patient Stated Goal: home OT Goal Formulation: With patient Time For  Goal Achievement: 03/04/17 Potential to Achieve Goals: Good ADL Goals Pt Will Perform Lower Body Bathing: with supervision;sit to/from stand;with adaptive equipment Pt Will Perform Lower Body Dressing: with supervision;with adaptive equipment;sit to/from stand Pt Will Transfer to Toilet: with supervision;ambulating;bedside commode Pt Will Perform Tub/Shower Transfer: Tub transfer;tub bench;grab bars;ambulating (verbalize vs min guard) Additional ADL Goal #1: pt will perform bed mobility from flat bed at supervision level in preparation for adls  OT Frequency: Min 2X/week   Barriers to D/C:            Co-evaluation              AM-PAC PT "6 Clicks" Daily Activity     Outcome Measure Help from another person eating meals?: None Help from another person taking care of personal grooming?: None Help from another person toileting, which includes using toliet, bedpan, or urinal?: A Little Help from another person bathing (including washing, rinsing, drying)?: A Lot Help from another person to put on and taking off regular upper body clothing?: A Little Help from another person to put on and taking off regular lower body clothing?: A Little 6 Click Score: 19   End of Session    Activity Tolerance: Patient tolerated treatment well Patient left: in chair;with call bell/phone within reach;with chair alarm set  OT Visit Diagnosis: Pain Pain - Right/Left: Right Pain - part of body: Hip                Time: 6283-1517 OT Time Calculation (min): 24 min Charges:  OT General Charges $OT Visit: 1 Procedure OT Evaluation $OT Eval Low Complexity: 1 Procedure OT Treatments $Self Care/Home Management : 8-22 mins G-Codes:     [place], OTR/L [phone] 02/25/2017  Meredith Church 02/25/2017, 10:36 AM

## 2017-02-26 LAB — CBC
HCT: 40.2 % (ref 36.0–46.0)
Hemoglobin: 13.3 g/dL (ref 12.0–15.0)
MCH: 33.8 pg (ref 26.0–34.0)
MCHC: 33.1 g/dL (ref 30.0–36.0)
MCV: 102 fL — ABNORMAL HIGH (ref 78.0–100.0)
Platelets: 225 10*3/uL (ref 150–400)
RBC: 3.94 MIL/uL (ref 3.87–5.11)
RDW: 14.5 % (ref 11.5–15.5)
WBC: 12.4 10*3/uL — ABNORMAL HIGH (ref 4.0–10.5)

## 2017-02-26 LAB — BASIC METABOLIC PANEL
Anion gap: 8 (ref 5–15)
BUN: 12 mg/dL (ref 6–20)
CO2: 26 mmol/L (ref 22–32)
Calcium: 9 mg/dL (ref 8.9–10.3)
Chloride: 104 mmol/L (ref 101–111)
Creatinine, Ser: 0.53 mg/dL (ref 0.44–1.00)
GFR calc Af Amer: >60 mL/min (ref >60)
GFR calc non Af Amer: >60 mL/min (ref >60)
Glucose, Bld: 124 mg/dL — ABNORMAL HIGH (ref 65–99)
Potassium: 4.1 mmol/L (ref 3.5–5.1)
Sodium: 138 mmol/L (ref 135–145)

## 2017-02-26 MED ORDER — ALPRAZOLAM 0.5 MG PO TABS: 0.5000 mg | ORAL_TABLET | Freq: Every day | ORAL | 2 refills | Status: DC | PRN

## 2017-02-26 MED ORDER — OXYCODONE HCL 5 MG PO TABS: 5.0000 mg | ORAL_TABLET | ORAL | 0 refills | Status: DC | PRN

## 2017-02-26 NOTE — Progress Notes (Signed)
Physical Therapy Treatment Patient Details Name: Meredith Church MRN: [id number] DOB: [birth year] Today's Date: 02/26/2017    History of Present Illness 61 yo female s/p R THA-direct anterior 02/24/17.  H/o back sx and sciatica and anxiety    PT Comments    Pt progressed to stair training with 1 rail and 1 cane with step to pattern.  Making good progress with ambulation with RW.    Follow Up Recommendations  DC plan and follow up therapy as arranged by surgeon;Supervision - Intermittent     Equipment Recommendations  Rolling walker with 5" wheels    Recommendations for Other Services       Precautions / Restrictions Precautions Precautions: Fall Precaution Comments: pt still has some back pain and sciatica pain at times Restrictions Weight Bearing Restrictions: No    Mobility  Bed Mobility               General bed mobility comments: Pt was up in recliner upon arrival  Transfers Overall transfer level: Needs assistance Equipment used: Rolling walker (2 wheeled) Transfers: Sit to/from Stand Sit to Stand: Supervision         General transfer comment: cues for UE placement  Ambulation/Gait Ambulation/Gait assistance: Min guard;Supervision Ambulation Distance (Feet): 375 Feet Assistive device: Rolling walker (2 wheeled) Gait Pattern/deviations: Step-through pattern;Decreased stride length     General Gait Details: improved speed today   Stairs Stairs: Yes   Stair Management: One rail Left;Step to pattern;Forwards;With cane Number of Stairs: 5 General stair comments: cues for safe technique with use of rail and cane  Wheelchair Mobility    Modified Rankin (Stroke Patients Only)       Balance                                            Cognition Arousal/Alertness: Awake/alert Behavior During Therapy: WFL for tasks assessed/performed Overall Cognitive Status: Within Functional Limits for tasks assessed                                         Exercises      General Comments        Pertinent Vitals/Pain Faces Pain Scale: Hurts a little bit Pain Location: R hip Pain Descriptors / Indicators: Sore Pain Intervention(s): Limited activity within patient's tolerance;Monitored during session    Home Living                      Prior Function            PT Goals (current goals can now be found in the care plan section) Acute Rehab PT Goals PT Goal Formulation: With patient Time For Goal Achievement: 03/11/17 Potential to Achieve Goals: Good Progress towards PT goals: Progressing toward goals    Frequency    7X/week      PT Plan Current plan remains appropriate    Co-evaluation              AM-PAC PT "6 Clicks" Daily Activity  Outcome Measure  Difficulty turning over in bed (including adjusting bedclothes, sheets and blankets)?: A Little Difficulty moving from lying on back to sitting on the side of the bed? : A Little Difficulty sitting down on and standing up from a chair with arms (e.g.,  wheelchair, bedside commode, etc,.)?: A Little Help needed moving to and from a bed to chair (including a wheelchair)?: A Little Help needed walking in hospital room?: A Little Help needed climbing 3-5 steps with a railing? : A Little 6 Click Score: 18    End of Session Equipment Utilized During Treatment: Gait belt Activity Tolerance: Patient tolerated treatment well Patient left: in chair;with call bell/phone within reach;with chair alarm set Nurse Communication: Mobility status PT Visit Diagnosis: Muscle weakness (generalized) (M62.81);Difficulty in walking, not elsewhere classified (R26.2)     Time: 1040-1108 PT Time Calculation (min) (ACUTE ONLY): 28 min  Charges:  $Gait Training: 23-37 mins                    G Codes:       Meredith Church, [place] Pager [phone] 02/26/2017    Galen Manila 02/26/2017, 12:28 PM

## 2017-02-26 NOTE — Progress Notes (Signed)
     Subjective: 2 Days Post-Op Procedure(s) (LRB): RIGHT TOTAL HIP ARTHROPLASTY ANTERIOR APPROACH (Right)   Patient reports pain as mild, pain controlled. No events throughout the night.  Feels that she is doing well today.  Ready to be discharged home.  Objective:   VITALS:   Vitals:   02/26/17 0930 02/26/17 1312  BP: 97/60 106/74  Pulse: 60   Resp:    Temp:      Dorsiflexion/Plantar flexion intact Incision: dressing C/D/I No cellulitis present Compartment soft  LABS  Recent Labs  02/25/17 0512 02/26/17 0459  HGB 12.8 13.3  HCT 38.3 40.2  WBC 10.5 12.4*  PLT 225 225     Recent Labs  02/25/17 0512 02/26/17 0459  NA 137 138  K 4.0 4.1  BUN 7 12  CREATININE 0.51 0.53  GLUCOSE 132* 124*     Assessment/Plan: 2 Days Post-Op Procedure(s) (LRB): RIGHT TOTAL HIP ARTHROPLASTY ANTERIOR APPROACH (Right) Up with therapy Discharge home Follow up in 2 weeks at [facility]. Follow up with OLIN,Drake Wuertz D in 2 weeks.  Contact information:  [facility] [address] [place] Karmela Bram   PAC  02/26/2017, 1:39 PM

## 2017-02-26 NOTE — Progress Notes (Signed)
Occupational Therapy Treatment Patient Details Name: Meredith Church MRN: [id number] DOB: 01/10/1956 Today's Date: 02/26/2017    History of present illness 61 yo female s/p R THA-direct anterior 02/24/17.  H/o back sx and sciatica and anxiety   OT comments  Pt is making good progress with OT.  Mostly supervision level with cues for safety.  Issued AE kit  Follow Up Recommendations  Supervision/Assistance - 24 hour (initially)    Equipment Recommendations  Tub/shower seat    Recommendations for Other Services      Precautions / Restrictions Precautions Precautions: Fall Precaution Comments: pt still has some back pain and sciatica pain at times Restrictions Weight Bearing Restrictions: No       Mobility Bed Mobility               General bed mobility comments: pt was sitting EOB  Transfers   Equipment used: Rolling walker (2 wheeled)   Sit to Stand: Supervision         General transfer comment: cues for UE placement    Balance                                           ADL either performed or assessed with clinical judgement   ADL       Grooming: Wash/dry hands;Wash/dry face;Standing;Supervision/safety   Upper Body Bathing: Set up;Sitting   Lower Body Bathing: Supervison/ safety;Sit to/from stand;With adaptive equipment   Upper Body Dressing : Set up;Sitting   Lower Body Dressing: Minimal assistance;With adaptive equipment;Sit to/from stand   Toilet Transfer: Supervision/safety;Ambulation;RW;Comfort height toilet;Grab bars   Toileting- Clothing Manipulation and Hygiene: Supervision/safety;Sit to/from stand         General ADL Comments: worked through ADL with AE: pt needed assistance only to tie shoes:  tried to tie prior to donning but they were too loose.  Issued AE.  Cues sit to for LB adls.     Vision       Perception     Praxis      Cognition Arousal/Alertness: Awake/alert Behavior During Therapy: WFL for tasks  assessed/performed Overall Cognitive Status: Within Functional Limits for tasks assessed                                          Exercises     Shoulder Instructions       General Comments      Pertinent Vitals/ Pain       Faces Pain Scale: Hurts a little bit Pain Location: R hip Pain Descriptors / Indicators: Sore Pain Intervention(s): Limited activity within patient's tolerance;Monitored during session;Premedicated before session;Repositioned  Home Living                                          Prior Functioning/Environment              Frequency           Progress Toward Goals  OT Goals(current goals can now be found in the care plan section)  Progress towards OT goals: Progressing toward goals (all goals met except for bed mobility)     Plan      Co-evaluation  AM-PAC PT "6 Clicks" Daily Activity     Outcome Measure   Help from another person eating meals?: None Help from another person taking care of personal grooming?: None Help from another person toileting, which includes using toliet, bedpan, or urinal?: A Little Help from another person bathing (including washing, rinsing, drying)?: A Lot Help from another person to put on and taking off regular upper body clothing?: A Little Help from another person to put on and taking off regular lower body clothing?: A Little 6 Click Score: 19    End of Session    OT Visit Diagnosis: Pain Pain - Right/Left: Right Pain - part of body: Hip   Activity Tolerance Patient tolerated treatment well   Patient Left in chair;with call bell/phone within reach;with chair alarm set   Nurse Communication          Time: 0315-9458 OT Time Calculation (min): 31 min  Charges: OT General Charges $OT Visit: 1 Procedure OT Treatments $Self Care/Home Management : 23-37 mins  Lesle Chris, OTR/L [phone] 02/26/2017   [place] 02/26/2017,  10:59 AM

## 2017-02-27 ENCOUNTER — Encounter (HOSPITAL_COMMUNITY): Payer: Self-pay | Admitting: Orthopedic Surgery

## 2017-03-01 NOTE — Discharge Summary (Signed)
Physician Discharge Summary  Patient ID: Meredith Church MRN: [id number] DOB/AGE: 1956-07-13 61 y.o.  Admit date: 02/24/2017 Discharge date: 02/26/2017   Procedures:  Procedure(s) (LRB): RIGHT TOTAL HIP ARTHROPLASTY ANTERIOR APPROACH (Right)  Attending Physician:  Dr. Paralee Cancel   Admission Diagnoses:    Right hip primary OA / pain  Discharge Diagnoses:  Principal Problem:   S/P right THA, AA Active Problems:   Obese  Past Medical History:  Diagnosis Date  . Anxiety Dx 2007  . Arthritis   . COPD (chronic obstructive pulmonary disease) ([place])   . Eczema   . Hyperlipidemia Dx 2010  . Hypertension Dx 2010  . Numbness and tingling of lower extremity     HPI:    Meredith Church, 61 y.o. female, has a history of pain and functional disability in the right hip(s) due to arthritis and patient has failed non-surgical conservative treatments for greater than 12 weeks to include NSAID's and/or analgesics, corticosteriod injections and activity modification.  Onset of symptoms was gradual starting 2+ years ago with gradually worsening course since that time.The patient noted prior procedures of the hip to include sciatic release on the right hip(s).  Patient currently rates pain in the right hip at 10 out of 10 with activity. Patient has night pain, worsening of pain with activity and weight bearing, trendelenberg gait, pain that interfers with activities of daily living and pain with passive range of motion. Patient has evidence of periarticular osteophytes and joint space narrowing by imaging studies. This condition presents safety issues increasing the risk of falls. There is no current active infection.   Risks, benefits and expectations were discussed with the patient.  Risks including but not limited to the risk of anesthesia, blood clots, nerve damage, blood vessel damage, failure of the prosthesis, infection and up to and including death.  Patient understand the risks, benefits and  expectations and wishes to proceed with surgery.   PCP: Boykin Nearing, MD   Discharged Condition: good  Hospital Course:  Patient underwent the above stated procedure on 02/24/2017. Patient tolerated the procedure well and brought to the recovery room in good condition and subsequently to the floor.  POD #1 BP: 113/68 ; Pulse: 74 ; Temp: 98.2 F (36.8 C) ; Resp: 16 Patient reports pain as mild, pain controlled. No events throughout the night. Plan for discharge tomorrowdue to the need for inpatient therapy to meet goal of being discharged home safely with family/caregiver. Dorsiflexion/plantar flexion intact, incision: dressing C/D/I, no cellulitis present and compartment soft.   LABS  Basename    HGB     12.8  HCT     38.3   POD #2  BP: 106/74 ; Pulse: 65 ; Temp: 97.9 F (36.6 C) ; Resp: 17 Patient reports pain as mild, pain controlled. No events throughout the night.  Feels that she is doing well today.  Ready to be discharged home. Dorsiflexion/plantar flexion intact, incision: dressing C/D/I, no cellulitis present and compartment soft.   LABS  Basename    HGB     13.3  HCT     40.2    Discharge Exam: General appearance: alert, cooperative and no distress Extremities: Homans sign is negative, no sign of DVT, no edema, redness or tenderness in the calves or thighs and no ulcers, gangrene or trophic changes  Disposition: Home with follow up in 2 weeks   Follow-up Information    Paralee Cancel, MD. Schedule an appointment as soon as possible for a  visit in 2 week(s).   Specialty:  Orthopedic Surgery Contact information: [address] [place] [address] [phone]           Discharge Instructions    Call MD / Call 911    Complete by:  As directed    If you experience chest pain or shortness of breath, CALL 911 and be transported to the hospital emergency room.  If you develope a fever above 101 F, pus (white drainage) or increased drainage  or redness at the wound, or calf pain, call your surgeon's office.   Change dressing    Complete by:  As directed    Maintain surgical dressing until follow up in the clinic. If the edges start to pull up, may reinforce with tape. If the dressing is no longer working, may remove and cover with gauze and tape, but must keep the area dry and clean.  Call with any questions or concerns.   Constipation Prevention    Complete by:  As directed    Drink plenty of fluids.  Prune juice may be helpful.  You may use a stool softener, such as Colace (over the counter) 100 mg twice a day.  Use MiraLax (over the counter) for constipation as needed.   Diet - low sodium heart healthy    Complete by:  As directed    Discharge instructions    Complete by:  As directed    Maintain surgical dressing until follow up in the clinic. If the edges start to pull up, may reinforce with tape. If the dressing is no longer working, may remove and cover with gauze and tape, but must keep the area dry and clean.  Follow up in 2 weeks at [facility]. Call with any questions or concerns.   Increase activity slowly as tolerated    Complete by:  As directed    Weight bearing as tolerated with assist device (walker, cane, etc) as directed, use it as long as suggested by your surgeon or therapist, typically at least 4-6 weeks.   TED hose    Complete by:  As directed    Use stockings (TED hose) for 2 weeks on both leg(s).  You may remove them at night for sleeping.      Allergies as of 02/26/2017      Reactions   Other Other (See Comments)   Nickel "breaks my skin out" Fire ants      Medication List    STOP taking these medications   amitriptyline 75 MG tablet Commonly known as:  ELAVIL   fluocinonide-emollient 0.05 % cream Commonly known as:  LIDEX-E   gabapentin 300 MG capsule Commonly known as:  NEURONTIN     TAKE these medications   albuterol 108 (90 Base) MCG/ACT inhaler Commonly known as:   PROVENTIL HFA;VENTOLIN HFA Inhale 2 puffs into the lungs every 6 (six) hours as needed for wheezing or shortness of breath.   ALPRAZolam 0.5 MG tablet Commonly known as:  XANAX Take 1 tablet (0.5 mg total) by mouth daily as needed for anxiety or sleep.   amLODipine 10 MG tablet Commonly known as:  NORVASC Take 1 tablet (10 mg total) by mouth daily.   ammonium lactate 12 % cream Commonly known as:  LAC-HYDRIN Apply topically as needed for dry skin. What changed:  how much to take  when to take this  reasons to take this   Biotin 1000 MCG tablet Take 1,000 mcg by mouth daily.  Cane Misc 1 each by Does not apply route daily.   HUGO ROLLING WALKER BASIC Misc 1 each by Does not apply route daily.   docusate sodium 100 MG capsule Commonly known as:  COLACE Take 1 capsule (100 mg total) by mouth 2 (two) times daily.   ferrous sulfate 325 (65 FE) MG tablet Commonly known as:  FERROUSUL Take 1 tablet (325 mg total) by mouth 3 (three) times daily with meals.   L-LYSINE PO Take 1 tablet by mouth daily.   methocarbamol 500 MG tablet Commonly known as:  ROBAXIN Take 1 tablet (500 mg total) by mouth every 6 (six) hours as needed for muscle spasms. What changed:  medication strength  how much to take  when to take this  reasons to take this   metoprolol tartrate 50 MG tablet Commonly known as:  LOPRESSOR TAKE 1 TABLET BY MOUTH 2 TIMES DAILY   nicotine 21 mg/24hr patch Commonly known as:  NICODERM CQ - dosed in mg/24 hours Place 1 patch (21 mg total) onto the skin daily. What changed:  Another medication with the same name was removed. Continue taking this medication, and follow the directions you see here.   oxyCODONE 5 MG immediate release tablet Commonly known as:  Oxy IR/ROXICODONE Take 1-2 tablets (5-10 mg total) by mouth every 4 (four) hours as needed for moderate pain or severe pain. What changed:  when to take this  reasons to take this   polyethylene  glycol packet Commonly known as:  MIRALAX / GLYCOLAX Take 17 g by mouth 2 (two) times daily.   rivaroxaban 10 MG Tabs tablet Commonly known as:  XARELTO Take 1 tablet (10 mg total) by mouth daily.        Signed: [place] Pugh. Meredith Stillman   PA-C  03/01/2017, 12:22 PM

## 2017-03-02 NOTE — Addendum Note (Signed)
Addendum  created 03/02/17 1712 by Lollie Sails, CRNA   Anesthesia Staff edited

## 2017-03-02 NOTE — Anesthesia Postprocedure Evaluation (Signed)
Anesthesia Post Note  Patient: ALAN RILES  Procedure(s) Performed: Procedure(s) (LRB): RIGHT TOTAL HIP ARTHROPLASTY ANTERIOR APPROACH (Right)     Patient location during evaluation: PACU Anesthesia Type: Spinal Level of consciousness: oriented and awake and alert Pain management: pain level controlled Vital Signs Assessment: post-procedure vital signs reviewed and stable Respiratory status: spontaneous breathing, respiratory function stable and patient connected to nasal cannula oxygen Cardiovascular status: blood pressure returned to baseline and stable Postop Assessment: no headache and no backache Anesthetic complications: no    Last Vitals:  Vitals:   02/26/17 0930 02/26/17 1312  BP: 97/60 106/74  Pulse: 60   Resp:    Temp:      Last Pain:  Vitals:   02/26/17 1405  TempSrc:   PainSc: 2                  Benecio Kluger S

## 2017-03-09 ENCOUNTER — Telehealth: Payer: Self-pay | Admitting: Family Medicine

## 2017-03-09 ENCOUNTER — Ambulatory Visit: Payer: Medicaid Other

## 2017-03-09 NOTE — Telephone Encounter (Signed)
Will route to PCP 

## 2017-03-09 NOTE — Telephone Encounter (Signed)
PT called to inform you that she had Hip replacement surgery and she need a prescription for a cane, can you please fax a prescription to [facility] fax [phone]...please follow up with pt

## 2017-03-11 NOTE — Telephone Encounter (Signed)
Prescription has been written.

## 2017-03-11 NOTE — Telephone Encounter (Signed)
Script has been faxed over

## 2017-04-07 ENCOUNTER — Ambulatory Visit: Payer: Medicaid Other

## 2017-04-13 ENCOUNTER — Encounter: Payer: Self-pay | Admitting: Family Medicine

## 2017-04-13 ENCOUNTER — Other Ambulatory Visit: Payer: Self-pay | Admitting: Family Medicine

## 2017-04-13 ENCOUNTER — Ambulatory Visit: Payer: Medicaid Other | Attending: Family Medicine | Admitting: Family Medicine

## 2017-04-13 VITALS — BP 131/80 | HR 108 | Temp 98.4°F | Ht 64.0 in | Wt 171.8 lb

## 2017-04-13 DIAGNOSIS — L853 Xerosis cutis: Secondary | ICD-10-CM

## 2017-04-13 DIAGNOSIS — L810 Postinflammatory hyperpigmentation: Secondary | ICD-10-CM | POA: Diagnosis not present

## 2017-04-13 DIAGNOSIS — I10 Essential (primary) hypertension: Secondary | ICD-10-CM

## 2017-04-13 DIAGNOSIS — Z7901 Long term (current) use of anticoagulants: Secondary | ICD-10-CM | POA: Diagnosis not present

## 2017-04-13 DIAGNOSIS — L821 Other seborrheic keratosis: Secondary | ICD-10-CM | POA: Insufficient documentation

## 2017-04-13 DIAGNOSIS — F17200 Nicotine dependence, unspecified, uncomplicated: Secondary | ICD-10-CM

## 2017-04-13 DIAGNOSIS — F411 Generalized anxiety disorder: Secondary | ICD-10-CM

## 2017-04-13 DIAGNOSIS — F419 Anxiety disorder, unspecified: Secondary | ICD-10-CM | POA: Insufficient documentation

## 2017-04-13 DIAGNOSIS — M25551 Pain in right hip: Secondary | ICD-10-CM | POA: Diagnosis not present

## 2017-04-13 DIAGNOSIS — Z96641 Presence of right artificial hip joint: Secondary | ICD-10-CM | POA: Diagnosis not present

## 2017-04-13 MED ORDER — METOPROLOL TARTRATE 50 MG PO TABS: 50.0000 mg | ORAL_TABLET | Freq: Two times a day (BID) | ORAL | 11 refills | Status: DC

## 2017-04-13 MED ORDER — VARENICLINE TARTRATE 1 MG PO TABS: 1.0000 mg | ORAL_TABLET | Freq: Two times a day (BID) | ORAL | 1 refills | Status: DC

## 2017-04-13 MED ORDER — AMMONIUM LACTATE 12 % EX CREA: TOPICAL_CREAM | CUTANEOUS | 0 refills | Status: DC | PRN

## 2017-04-13 MED ORDER — VARENICLINE TARTRATE 0.5 MG X 11 & 1 MG X 42 PO MISC
ORAL | 0 refills | Status: DC
Start: 2017-04-13 — End: 2017-10-12

## 2017-04-13 MED ORDER — AMLODIPINE BESYLATE 10 MG PO TABS: 10.0000 mg | ORAL_TABLET | Freq: Every day | ORAL | 11 refills | Status: DC

## 2017-04-13 MED ORDER — HYDROQUINONE 4 % EX CREA: TOPICAL_CREAM | Freq: Two times a day (BID) | CUTANEOUS | 0 refills | Status: AC

## 2017-04-13 MED ORDER — ALPRAZOLAM 0.5 MG PO TABS: 0.5000 mg | ORAL_TABLET | Freq: Two times a day (BID) | ORAL | 2 refills | Status: DC | PRN

## 2017-04-13 MED FILL — AMMONIUM LACTATE 12% CREAM: 12 | 15 days supply | Qty: 385 | Fill #0

## 2017-04-13 MED FILL — AMLODIPINE BESYLATE 10 MG T: 10 | 30 days supply | Qty: 30 | Fill #0

## 2017-04-13 MED FILL — METOPROLOL TARTRATE 50 MG T: 50 | 30 days supply | Qty: 60 | Fill #7

## 2017-04-13 NOTE — Assessment & Plan Note (Signed)
On face following cryotherapy for seborrheic keratoses Hydroquinone 4% cream with SPF 30-50 1-2 times daily to hyperpigmented areas

## 2017-04-13 NOTE — Assessment & Plan Note (Signed)
Chronic anxiety Refilled low dose xanax

## 2017-04-13 NOTE — Patient Instructions (Addendum)
Miyani was seen today for hip pain.  Diagnoses and all orders for this visit:  Smoker -     varenicline (CHANTIX STARTING MONTH PAK) 0.5 MG X 11 & 1 MG X 42 tablet; Taper per packet insert -     varenicline (CHANTIX CONTINUING MONTH PAK) 1 MG tablet; Take 1 tablet (1 mg total) by mouth 2 (two) times daily.  Anxiety state -     ALPRAZolam (XANAX) 0.5 MG tablet; Take 1 tablet (0.5 mg total) by mouth 2 (two) times daily as needed for anxiety or sleep.  Essential hypertension, benign -     amLODipine (NORVASC) 10 MG tablet; Take 1 tablet (10 mg total) by mouth daily. -     metoprolol tartrate (LOPRESSOR) 50 MG tablet; Take 1 tablet (50 mg total) by mouth 2 (two) times daily.  Xerosis of skin -     ammonium lactate (LAC-HYDRIN) 12 % cream; Apply topically as needed for dry skin.  Postinflammatory hyperpigmentation -     hydroquinone 4 % cream; Apply topically 2 (two) times daily. To dark areas of face. Use of SPF 30 or 50 sunscreen moisturizer  Start chantix: Take one 0.5 mg tablet by mouth once daily for 3 days, then increase to one 0.5 mg tablet twice daily for 4 days, then increase to one 1 mg tablet twice daily. Set quit date for 8-35 days after starting chantix.  Use hydroquinone once daily for first week if tolerating well increase to twice daily Stop if you have redness, soreness or peeling of skin   F/u in 6 weeks for smoking cessaiton   Dr. Adrian Blackwater

## 2017-04-13 NOTE — Progress Notes (Signed)
Subjective:  Patient ID: Meredith Church, female    DOB: January 07, 1956  Age: 61 y.o. MRN: [id number]  CC: Hip Pain   HPI Meredith Church presents for    1. R leg pain: she reports swelling and pain in her R leg. She is being managed by Dr. Alvan Dame (Ortho). She is 5 weeks s/p right total hip replacement on 02/24/2017. She is walking with a cane. She has not yet but will start therapy after 04/22/2017.   2. Anxiety: this is chronic. Worsened after hip replacement. She had a lot of pain and anxiety after the procedure. Taking xanax 0.5 mg every morning stating she wakes up with stiffness and this triggers anxiety. Xanax 0.5 mg prn as night.   3. 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 She would like treatment for the dark spots.  4. Smoking: she desires to quit. She quit prior to surgery. She request chantix.    Past Surgical History:  Procedure Laterality Date  . ABDOMINAL HYSTERECTOMY    . FRACTURE SURGERY     Left pinky finger  . LIPOMA EXCISION    . LUMBAR LAMINECTOMY/DECOMPRESSION MICRODISCECTOMY N/A 07/03/2016   Procedure: LUMBAR FOUR - LUMBAR FIVE LAMINECTOMY/DISKECTOMY;  Surgeon: [name] [name], MD;  Location: [facility];  Service: Neurosurgery;  Laterality: N/A;  LUMBAR FOUR - LUMBAR FIVE LAMINECTOMY/DISKECTOMY  . SALIVARY GLAND SURGERY    . sciatica     Dr. Sharmaine Base oct. 2017  . TIBIA FRACTURE SURGERY     right  . TOTAL HIP ARTHROPLASTY Right 02/24/2017   Procedure: RIGHT TOTAL HIP ARTHROPLASTY ANTERIOR APPROACH;  Surgeon: Paralee Cancel, MD;  Location: [facility];  Service: Orthopedics;  Laterality: Right;    Outpatient Medications Prior to Visit  Medication Sig Dispense Refill  . albuterol (PROVENTIL HFA;VENTOLIN HFA) 108 (90 Base) MCG/ACT inhaler Inhale 2 puffs into the lungs every 6 (six) hours as needed for wheezing or shortness of breath. 1 Inhaler 11  . ALPRAZolam (XANAX) 0.5 MG tablet Take 1 tablet  (0.5 mg total) by mouth daily as needed for anxiety or sleep. 20 tablet 2  . amLODipine (NORVASC) 10 MG tablet Take 1 tablet (10 mg total) by mouth daily. 30 tablet 3  . amLODipine (NORVASC) 10 MG tablet TAKE 1 TABLET BY MOUTH DAILY 30 tablet 0  . ammonium lactate (LAC-HYDRIN) 12 % cream Apply topically as needed for dry skin. (Patient taking differently: Apply 1 g topically 2 (two) times daily as needed (for ezcema.). ) 385 g 0  . Biotin 1000 MCG tablet Take 1,000 mcg by mouth daily.     Marland Kitchen docusate sodium (COLACE) 100 MG capsule Take 1 capsule (100 mg total) by mouth 2 (two) times daily. 10 capsule 0  . ferrous sulfate (FERROUSUL) 325 (65 FE) MG tablet Take 1 tablet (325 mg total) by mouth 3 (three) times daily with meals.    . L-LYSINE PO Take 1 tablet by mouth daily.     . methocarbamol (ROBAXIN) 500 MG tablet Take 1 tablet (500 mg total) by mouth every 6 (six) hours as needed for muscle spasms. 40 tablet 0  . metoprolol (LOPRESSOR) 50 MG tablet TAKE 1 TABLET BY MOUTH 2 TIMES DAILY 60 tablet 11  . Misc. Devices (CANE) MISC 1 each by Does not apply route daily. 1 each 0  . Misc. Devices (HUGO ROLLING WALKER BASIC) MISC 1 each by Does not apply route daily. 1 each 0  .  nicotine (NICODERM CQ - DOSED IN MG/24 HOURS) 21 mg/24hr patch Place 1 patch (21 mg total) onto the skin daily. 21 patch 1  . oxyCODONE (OXY IR/ROXICODONE) 5 MG immediate release tablet Take 1-2 tablets (5-10 mg total) by mouth every 4 (four) hours as needed for moderate pain or severe pain. 60 tablet 0  . polyethylene glycol (MIRALAX / GLYCOLAX) packet Take 17 g by mouth 2 (two) times daily. 14 each 0  . rivaroxaban (XARELTO) 10 MG TABS tablet Take 1 tablet (10 mg total) by mouth daily. 14 tablet 0   No facility-administered medications prior to visit.     ROS Review of Systems  Constitutional: Negative for chills and fever.  Eyes: Negative for visual disturbance.  Respiratory: Negative for shortness of breath.     Cardiovascular: Negative for chest pain.  Gastrointestinal: Negative for abdominal pain and blood in stool.  Musculoskeletal: Positive for arthralgias and back pain.  Skin: Positive for color change. Negative for rash.  Allergic/Immunologic: Negative for immunocompromised state.  Hematological: Negative for adenopathy. Does not bruise/bleed easily.  Psychiatric/Behavioral: Negative for dysphoric mood and suicidal ideas.    Objective:  BP 131/80   Pulse (!) 108   Temp 98.4 F (36.9 C) (Oral)   Ht 5\' 4"  (1.626 m)   Wt 171 lb 12.8 oz (77.9 kg)   SpO2 93%   BMI 29.49 kg/m   BP/Weight 04/13/2017 02/26/2017 01/01/3789  Systolic BP 240 973 -  Diastolic BP 80 74 -  Wt. (Lbs) 171.8 - 175  BMI 29.49 - 30.04   Physical Exam  Constitutional: She is oriented to person, place, and time. She appears well-developed and well-nourished. No distress.  HENT:  Head: Normocephalic and atraumatic.  Cardiovascular: Normal rate, regular rhythm, normal heart sounds and intact distal pulses.   Pulmonary/Chest: Effort normal and breath sounds normal.  Musculoskeletal: She exhibits no edema.       Legs: Neurological: She is alert and oriented to person, place, and time.  Skin: Skin is warm and dry. No rash noted.     Psychiatric: She has a normal mood and affect.    Assessment & Plan:  Meredith Church was seen today for hip pain.  Diagnoses and all orders for this visit:  Smoker -     varenicline (CHANTIX STARTING MONTH PAK) 0.5 MG X 11 & 1 MG X 42 tablet; Taper per packet insert -     varenicline (CHANTIX CONTINUING MONTH PAK) 1 MG tablet; Take 1 tablet (1 mg total) by mouth 2 (two) times daily.  Anxiety state -     ALPRAZolam (XANAX) 0.5 MG tablet; Take 1 tablet (0.5 mg total) by mouth 2 (two) times daily as needed for anxiety or sleep.  Essential hypertension, benign -     amLODipine (NORVASC) 10 MG tablet; Take 1 tablet (10 mg total) by mouth daily. -     metoprolol tartrate (LOPRESSOR) 50 MG  tablet; Take 1 tablet (50 mg total) by mouth 2 (two) times daily.  Xerosis of skin -     ammonium lactate (LAC-HYDRIN) 12 % cream; Apply topically as needed for dry skin.  Postinflammatory hyperpigmentation -     hydroquinone 4 % cream; Apply topically 2 (two) times daily. To dark areas of face. Use of SPF 30 or 50 sunscreen moisturizer    No orders of the defined types were placed in this encounter.   Follow-up: Return in about 6 weeks (around 05/25/2017) for smoking cessation .   Boykin Nearing MD

## 2017-04-13 NOTE — Assessment & Plan Note (Signed)
Smoker desires to quit 3 minutes spent with cessation counseling and discussing risk and benefits of chantix chantix ordered

## 2017-04-15 MED FILL — CHANTIX STARTING MONTH BOX: 0.5 MG X 11 | 28 days supply | Qty: 53 | Fill #0

## 2017-04-16 ENCOUNTER — Other Ambulatory Visit: Payer: Self-pay | Admitting: Family Medicine

## 2017-04-16 DIAGNOSIS — L301 Dyshidrosis [pompholyx]: Secondary | ICD-10-CM

## 2017-06-10 ENCOUNTER — Encounter: Payer: Self-pay | Admitting: Family Medicine

## 2017-06-10 ENCOUNTER — Ambulatory Visit: Payer: Medicaid Other | Attending: Family Medicine | Admitting: Family Medicine

## 2017-06-10 ENCOUNTER — Other Ambulatory Visit: Payer: Self-pay | Admitting: Family Medicine

## 2017-06-10 VITALS — BP 115/78 | HR 82 | Temp 97.8°F | Ht 64.0 in | Wt 167.8 lb

## 2017-06-10 DIAGNOSIS — M25551 Pain in right hip: Secondary | ICD-10-CM | POA: Diagnosis not present

## 2017-06-10 DIAGNOSIS — I10 Essential (primary) hypertension: Secondary | ICD-10-CM | POA: Insufficient documentation

## 2017-06-10 DIAGNOSIS — J42 Unspecified chronic bronchitis: Secondary | ICD-10-CM

## 2017-06-10 DIAGNOSIS — R262 Difficulty in walking, not elsewhere classified: Secondary | ICD-10-CM | POA: Insufficient documentation

## 2017-06-10 DIAGNOSIS — Z720 Tobacco use: Secondary | ICD-10-CM | POA: Diagnosis not present

## 2017-06-10 DIAGNOSIS — L309 Dermatitis, unspecified: Secondary | ICD-10-CM | POA: Insufficient documentation

## 2017-06-10 DIAGNOSIS — F411 Generalized anxiety disorder: Secondary | ICD-10-CM | POA: Diagnosis not present

## 2017-06-10 DIAGNOSIS — L853 Xerosis cutis: Secondary | ICD-10-CM | POA: Diagnosis not present

## 2017-06-10 DIAGNOSIS — F17200 Nicotine dependence, unspecified, uncomplicated: Secondary | ICD-10-CM | POA: Diagnosis not present

## 2017-06-10 DIAGNOSIS — Z7901 Long term (current) use of anticoagulants: Secondary | ICD-10-CM | POA: Insufficient documentation

## 2017-06-10 DIAGNOSIS — Z96641 Presence of right artificial hip joint: Secondary | ICD-10-CM | POA: Insufficient documentation

## 2017-06-10 DIAGNOSIS — E785 Hyperlipidemia, unspecified: Secondary | ICD-10-CM | POA: Diagnosis not present

## 2017-06-10 DIAGNOSIS — J449 Chronic obstructive pulmonary disease, unspecified: Secondary | ICD-10-CM | POA: Insufficient documentation

## 2017-06-10 MED ORDER — AMMONIUM LACTATE 12 % EX CREA: TOPICAL_CREAM | CUTANEOUS | 0 refills | Status: AC | PRN

## 2017-06-10 MED ORDER — AMLODIPINE BESYLATE 10 MG PO TABS: 10.0000 mg | ORAL_TABLET | Freq: Every day | ORAL | 6 refills | Status: DC

## 2017-06-10 MED ORDER — METOPROLOL TARTRATE 50 MG PO TABS: 50.0000 mg | ORAL_TABLET | Freq: Two times a day (BID) | ORAL | 11 refills | Status: AC

## 2017-06-10 MED ORDER — ALBUTEROL SULFATE HFA 108 (90 BASE) MCG/ACT IN AERS: 2.0000 | INHALATION_SPRAY | Freq: Four times a day (QID) | RESPIRATORY_TRACT | 6 refills | Status: DC | PRN

## 2017-06-10 MED FILL — AMMONIUM LACTATE 12% CREAM: 12 | 15 days supply | Qty: 385 | Fill #0

## 2017-06-10 MED FILL — PROAIR HFA 90 MCG INHALER: 108 (90 BAS | 25 days supply | Qty: 9 | Fill #0

## 2017-06-10 MED FILL — AMLODIPINE BESYLATE 10 MG T: 10 | 30 days supply | Qty: 30 | Fill #0

## 2017-06-10 NOTE — Patient Instructions (Signed)

## 2017-06-10 NOTE — Progress Notes (Signed)
Subjective:  Patient ID: Meredith Church, female    DOB: [birth year]  Age: 61 y.o. MRN: [id number]  CC: Establish Care   HPI Meredith Church a 61 year old female with a history of COPD, anxiety, tobacco abuse, hypertension, right hip replacement in 02/2017 who presents today to establish care with me. Previously followed by Dr. Adrian Blackwater.  She denies exacerbation of COPD and uses her albuterol MDI as needed. Continues to smoke and is using Chantix with the hope of quitting.  For anxiety she was receiving Xanax from her previous PCP which she states she used as needed. She never tried SSRIs or any form of other medication but states she has been on Xanax for the past 30 years and has been doing well on it. Denies depression, suicidal ideation or intentions or worsening of her anxiety.  She does have intermittent right hip pain which causes her to ambulate with a cane. Physical therapy postsurgery will be commencing next month and she is closely followed by her orthopedics.  She tolerates antihypertensive and denies any side effects from it. Requests a refill of Lac-Hydrin which she uses for dry skin and eczema.  Past Medical History:  Diagnosis Date  . Anxiety Dx 2007  . Arthritis   . COPD (chronic obstructive pulmonary disease) ([place])   . Eczema   . Hyperlipidemia Dx 2010  . Hypertension Dx 2010  . Numbness and tingling of lower extremity     Past Surgical History:  Procedure Laterality Date  . ABDOMINAL HYSTERECTOMY    . FRACTURE SURGERY     Left pinky finger  . LIPOMA EXCISION    . LUMBAR LAMINECTOMY/DECOMPRESSION MICRODISCECTOMY N/A 07/03/2016   Procedure: LUMBAR FOUR - LUMBAR FIVE LAMINECTOMY/DISKECTOMY;  Surgeon: [name] [name], MD;  Location: [facility];  Service: Neurosurgery;  Laterality: N/A;  LUMBAR FOUR - LUMBAR FIVE LAMINECTOMY/DISKECTOMY  . SALIVARY GLAND SURGERY    . sciatica     Dr. Sharmaine Base oct. 2017  . TIBIA FRACTURE SURGERY     right  . TOTAL HIP ARTHROPLASTY  Right 02/24/2017   Procedure: RIGHT TOTAL HIP ARTHROPLASTY ANTERIOR APPROACH;  Surgeon: Paralee Cancel, MD;  Location: [facility];  Service: Orthopedics;  Laterality: Right;    Allergies  Allergen Reactions  . Other Other (See Comments)     Nickel "breaks my skin out" Fire ants     Outpatient Medications Prior to Visit  Medication Sig Dispense Refill  . ALPRAZolam (XANAX) 0.5 MG tablet Take 1 tablet (0.5 mg total) by mouth 2 (two) times daily as needed for anxiety or sleep. 60 tablet 2  . Biotin 1000 MCG tablet Take 1,000 mcg by mouth daily.     . Misc. Devices (CANE) MISC 1 each by Does not apply route daily. 1 each 0  . Misc. Devices (HUGO ROLLING WALKER BASIC) MISC 1 each by Does not apply route daily. 1 each 0  . triamcinolone ointment (KENALOG) 0.5 % APPLY 1 APPLICATION TOPICALLY 2 TIMES DAILY. APPLY TO HANDS 30 g 2  . albuterol (PROVENTIL HFA;VENTOLIN HFA) 108 (90 Base) MCG/ACT inhaler Inhale 2 puffs into the lungs every 6 (six) hours as needed for wheezing or shortness of breath. 1 Inhaler 11  . amLODipine (NORVASC) 10 MG tablet Take 1 tablet (10 mg total) by mouth daily. 30 tablet 11  . metoprolol tartrate (LOPRESSOR) 50 MG tablet Take 1 tablet (50 mg total) by mouth 2 (two) times daily. 60 tablet 11  . hydroquinone 4 % cream Apply topically 2 (  two) times daily. To dark areas of face. Use of SPF 30 or 50 sunscreen moisturizer (Patient not taking: Reported on 06/10/2017) 28.35 g 0  . L-LYSINE PO Take 1 tablet by mouth daily.     . varenicline (CHANTIX CONTINUING MONTH PAK) 1 MG tablet Take 1 tablet (1 mg total) by mouth 2 (two) times daily. (Patient not taking: Reported on 06/10/2017) 60 tablet 1  . varenicline (CHANTIX STARTING MONTH PAK) 0.5 MG X 11 & 1 MG X 42 tablet Taper per packet insert (Patient not taking: Reported on 06/10/2017) 53 tablet 0  . ammonium lactate (LAC-HYDRIN) 12 % cream Apply topically as needed for dry skin. (Patient not taking: Reported on 06/10/2017) 385 g 0  .  docusate sodium (COLACE) 100 MG capsule Take 1 capsule (100 mg total) by mouth 2 (two) times daily. (Patient not taking: Reported on 06/10/2017) 10 capsule 0  . ferrous sulfate (FERROUSUL) 325 (65 FE) MG tablet Take 1 tablet (325 mg total) by mouth 3 (three) times daily with meals. (Patient not taking: Reported on 04/13/2017)    . methocarbamol (ROBAXIN) 500 MG tablet Take 1 tablet (500 mg total) by mouth every 6 (six) hours as needed for muscle spasms. (Patient not taking: Reported on 06/10/2017) 40 tablet 0  . oxyCODONE (OXY IR/ROXICODONE) 5 MG immediate release tablet Take 1-2 tablets (5-10 mg total) by mouth every 4 (four) hours as needed for moderate pain or severe pain. (Patient not taking: Reported on 06/10/2017) 60 tablet 0  . polyethylene glycol (MIRALAX / GLYCOLAX) packet Take 17 g by mouth 2 (two) times daily. (Patient not taking: Reported on 06/10/2017) 14 each 0  . rivaroxaban (XARELTO) 10 MG TABS tablet Take 1 tablet (10 mg total) by mouth daily. 14 tablet 0   No facility-administered medications prior to visit.     ROS Review of Systems  Constitutional: Negative for activity change, appetite change and fatigue.  HENT: Negative for congestion, sinus pressure and sore throat.   Eyes: Negative for visual disturbance.  Respiratory: Negative for cough, chest tightness, shortness of breath and wheezing.   Cardiovascular: Negative for chest pain and palpitations.  Gastrointestinal: Negative for abdominal distention, abdominal pain and constipation.  Endocrine: Negative for polydipsia.  Genitourinary: Negative for dysuria and frequency.  Musculoskeletal: Negative for back pain.       See hpi  Skin: Negative for rash.  Neurological: Negative for tremors, light-headedness and numbness.  Hematological: Does not bruise/bleed easily.  Psychiatric/Behavioral: Negative for agitation and behavioral problems.    Objective:  BP 115/78   Pulse 82   Temp 97.8 F (36.6 C) (Oral)   Ht '5\' 4"'$   (1.626 m)   Wt 167 lb 12.8 oz (76.1 kg)   SpO2 93%   BMI 28.80 kg/m   BP/Weight 06/10/2017 04/13/2017 3/66/4403  Systolic BP 474 259 563  Diastolic BP 78 80 74  Wt. (Lbs) 167.8 171.8 -  BMI 28.8 29.49 -      Physical Exam  Constitutional: She is oriented to person, place, and time. She appears well-developed and well-nourished.  Cardiovascular: Normal rate, normal heart sounds and intact distal pulses.   No murmur heard. Pulmonary/Chest: Effort normal and breath sounds normal. She has no wheezes. She has no rales. She exhibits no tenderness.  Abdominal: Soft. Bowel sounds are normal. She exhibits no distension and no mass. There is no tenderness.  Musculoskeletal:  Restricted range of motion of right hip  Neurological: She is alert and oriented to person, place, and  time.  Psychiatric: She has a normal mood and affect.     Assessment & Plan:   1. Essential hypertension, benign Controlled Low-sodium diet - metoprolol tartrate (LOPRESSOR) 50 MG tablet; Take 1 tablet (50 mg total) by mouth 2 (two) times daily.  Dispense: 60 tablet; Refill: 11 - CMP14+EGFR; Future - Lipid panel; Future - amLODipine (NORVASC) 10 MG tablet; Take 1 tablet (10 mg total) by mouth daily.  Dispense: 30 tablet; Refill: 6  2. Chronic bronchitis, unspecified chronic bronchitis type ([place]) No acute flares Advised on smoking cessation - albuterol (PROVENTIL HFA;VENTOLIN HFA) 108 (90 Base) MCG/ACT inhaler; Inhale 2 puffs into the lungs every 6 (six) hours as needed for wheezing or shortness of breath.  Dispense: 1 Inhaler; Refill: 6  3. Smoker Currently using Chantix  4. Xerosis of skin - ammonium lactate (LAC-HYDRIN) 12 % cream; Apply topically as needed for dry skin.  Dispense: 385 g; Refill: 0  5. Anxiety state We have discussed the habit forming nature of Xanax and that it is not indicated in long-term management of anxiety; she does have some Xanax pills at home I have informed her I will not be  providing refills She declines initiation of SSRI or BuSpar Would love to be referred to psychiatry. - Ambulatory referral to Psychiatry   Meds ordered this encounter  Medications  . metoprolol tartrate (LOPRESSOR) 50 MG tablet    Sig: Take 1 tablet (50 mg total) by mouth 2 (two) times daily.    Dispense:  60 tablet    Refill:  11  . amLODipine (NORVASC) 10 MG tablet    Sig: Take 1 tablet (10 mg total) by mouth daily.    Dispense:  30 tablet    Refill:  6  . ammonium lactate (LAC-HYDRIN) 12 % cream    Sig: Apply topically as needed for dry skin.    Dispense:  385 g    Refill:  0  . albuterol (PROVENTIL HFA;VENTOLIN HFA) 108 (90 Base) MCG/ACT inhaler    Sig: Inhale 2 puffs into the lungs every 6 (six) hours as needed for wheezing or shortness of breath.    Dispense:  1 Inhaler    Refill:  6    Follow-up: Return in about 6 months (around 12/08/2017) for Follow-up on hypertension.   Arnoldo Morale MD

## 2017-08-24 ENCOUNTER — Ambulatory Visit (HOSPITAL_COMMUNITY): Payer: Medicaid Other | Admitting: Psychiatry

## 2017-08-31 ENCOUNTER — Ambulatory Visit (HOSPITAL_COMMUNITY): Payer: Medicaid Other | Admitting: Psychiatry

## 2017-10-08 ENCOUNTER — Other Ambulatory Visit: Payer: Self-pay | Admitting: Internal Medicine

## 2017-10-08 DIAGNOSIS — Z1231 Encounter for screening mammogram for malignant neoplasm of breast: Secondary | ICD-10-CM

## 2017-10-08 DIAGNOSIS — E2839 Other primary ovarian failure: Secondary | ICD-10-CM

## 2017-10-12 ENCOUNTER — Ambulatory Visit (INDEPENDENT_AMBULATORY_CARE_PROVIDER_SITE_OTHER): Payer: Medicaid Other | Admitting: Psychiatry

## 2017-10-12 ENCOUNTER — Encounter (HOSPITAL_COMMUNITY): Payer: Self-pay | Admitting: Psychiatry

## 2017-10-12 VITALS — BP 126/68 | HR 69 | Ht 64.5 in | Wt 171.0 lb

## 2017-10-12 DIAGNOSIS — G4709 Other insomnia: Secondary | ICD-10-CM | POA: Diagnosis not present

## 2017-10-12 DIAGNOSIS — G8929 Other chronic pain: Secondary | ICD-10-CM | POA: Diagnosis not present

## 2017-10-12 DIAGNOSIS — F332 Major depressive disorder, recurrent severe without psychotic features: Secondary | ICD-10-CM

## 2017-10-12 DIAGNOSIS — M1990 Unspecified osteoarthritis, unspecified site: Secondary | ICD-10-CM | POA: Diagnosis not present

## 2017-10-12 DIAGNOSIS — M5430 Sciatica, unspecified side: Secondary | ICD-10-CM | POA: Diagnosis not present

## 2017-10-12 DIAGNOSIS — F17210 Nicotine dependence, cigarettes, uncomplicated: Secondary | ICD-10-CM | POA: Diagnosis not present

## 2017-10-12 DIAGNOSIS — Z79899 Other long term (current) drug therapy: Secondary | ICD-10-CM | POA: Diagnosis not present

## 2017-10-12 MED ORDER — VENLAFAXINE HCL ER 37.5 MG PO CP24: 37.5000 mg | ORAL_CAPSULE | Freq: Every day | ORAL | 1 refills | Status: DC

## 2017-10-12 MED ORDER — TRAZODONE HCL 50 MG PO TABS: 50.0000 mg | ORAL_TABLET | Freq: Every day | ORAL | 1 refills | Status: DC

## 2017-10-12 NOTE — Progress Notes (Signed)
Psychiatric Initial Adult Assessment   Patient Identification: Meredith Church MRN:  [id number] Date of Evaluation:  10/12/2017 Referral Source: self Chief Complaint:  depressed Visit Diagnosis:    ICD-10-CM   1. Severe episode of recurrent major depressive disorder, without psychotic features (HCC) F33.2 venlafaxine XR (EFFEXOR XR) 37.5 MG 24 hr capsule    traZODone (DESYREL) 50 MG tablet  2. Other insomnia G47.09     History of Present Illness:  Meredith Church is a 62 year old female who was generally healthy until approximately 1-1/2 years ago, when she began struggling with his chronic musculoskeletal pain, sciatica, and worsening arthritis.  She identifies as a very independent and hard-working individual, and reports that this past year has been quite difficult in terms of grieving the loss of her quality of life.  She has not had any thoughts about wanting to be dead, and has substantial fears about death and dying.  She presents today at the behest of her primary care doctor for psychiatric care.  She does feel that it would be beneficial for her to have a talk therapist, and is agreeable to consider medications for depression and insomnia.  She struggles with low energy, pessimistic mood, poor motivation, anhedonia, difficulty with concentration, poor appetite, and feels slower and sluggish.  I spent time educating her about major depressive disorder.  Reviewing other psychiatric symptoms, she does not present any symptoms of acute psychosis or mania, and does not have any history of substance abuse, or other psychiatric treatment.  I educated her on Effexor, and its utility in treating both major depressive disorder, and helping with neuropathic pain.  We discussed the modality of action on the brain, and the treatment of major depressive disorder.  I educated her about trazodone, and its effect with prolonged getting sleep and reducing middle night insomnia.  She was agreeable to start  both medications and follow-up in this clinic in 3 months, and we have provided referral for individual therapy.  She reports that she has a good support system from her friends and church community, and has a good relationship with her daughter and granddaughter who live in [place].  Associated Signs/Symptoms: Depression Symptoms:  depressed mood, anhedonia, insomnia, psychomotor retardation, fatigue, feelings of worthlessness/guilt, difficulty concentrating, recurrent thoughts of death, (Hypo) Manic Symptoms:  Irritable Mood, Anxiety Symptoms:  Excessive Worry, Psychotic Symptoms:  none PTSD Symptoms: Negative  Past Psychiatric History: No prior psychiatric treatment  Previous Psychotropic Medications: No   Substance Abuse History in the last 12 months:  No.  Consequences of Substance Abuse: Negative  Past Medical History:  Past Medical History:  Diagnosis Date  . Anxiety Dx 2007  . Arthritis   . COPD (chronic obstructive pulmonary disease) ([place])   . Eczema   . Hyperlipidemia Dx 2010  . Hypertension Dx 2010  . Numbness and tingling of lower extremity     Past Surgical History:  Procedure Laterality Date  . ABDOMINAL HYSTERECTOMY    . FRACTURE SURGERY     Left pinky finger  . LIPOMA EXCISION    . LUMBAR LAMINECTOMY/DECOMPRESSION MICRODISCECTOMY N/A 07/03/2016   Procedure: LUMBAR FOUR - LUMBAR FIVE LAMINECTOMY/DISKECTOMY;  Surgeon: [name] [name], MD;  Location: [facility];  Service: Neurosurgery;  Laterality: N/A;  LUMBAR FOUR - LUMBAR FIVE LAMINECTOMY/DISKECTOMY  . SALIVARY GLAND SURGERY    . sciatica     Dr. Sharmaine Base oct. 2017  . TIBIA FRACTURE SURGERY     right  . TOTAL HIP ARTHROPLASTY Right 02/24/2017  Procedure: RIGHT TOTAL HIP ARTHROPLASTY ANTERIOR APPROACH;  Surgeon: Paralee Cancel, MD;  Location: [facility];  Service: Orthopedics;  Laterality: Right;    Family Psychiatric History: Denies  Family History:  Family History  Problem Relation Age of Onset   . Diabetes Mother   . Diabetes Sister   . Cancer Brother        colon cancer     Social History:   Social History   Socioeconomic History  . Marital status: Single    Spouse name: None  . Number of children: None  . Years of education: None  . Highest education level: None  Social Needs  . Financial resource strain: None  . Food insecurity - worry: None  . Food insecurity - inability: None  . Transportation needs - medical: None  . Transportation needs - non-medical: None  Occupational History  . None  Tobacco Use  . Smoking status: Current Every Day Smoker    Packs/day: 0.20    Years: 30.00    Pack years: 6.00    Types: Cigarettes  . Smokeless tobacco: Never Used  . Tobacco comment: Smokes socially.  trying to quit with nic. patch  Substance and Sexual Activity  . Alcohol use: Yes    Alcohol/week: 0.0 oz    Comment: social  . Drug use: No  . Sexual activity: Yes    Partners: Male  Other Topics Concern  . None  Social History Narrative  . None    Additional Social History: Retired, never married, 2 children  Allergies:   Allergies  Allergen Reactions  . Other Other (See Comments)     Nickel "breaks my skin out" Fire ants    Metabolic Disorder Labs: Lab Results  Component Value Date   HGBA1C 5.9 10/03/2013   No results found for: PROLACTIN Lab Results  Component Value Date   CHOL 188 10/03/2013   TRIG 267 (H) 10/03/2013   HDL 42 10/03/2013   CHOLHDL 4.5 10/03/2013   VLDL 53 (H) 10/03/2013   LDLCALC 93 10/03/2013     Current Medications: Current Outpatient Medications  Medication Sig Dispense Refill  . albuterol (PROVENTIL HFA;VENTOLIN HFA) 108 (90 Base) MCG/ACT inhaler Inhale 2 puffs into the lungs every 6 (six) hours as needed for wheezing or shortness of breath. 1 Inhaler 6  . amLODipine (NORVASC) 10 MG tablet Take 1 tablet (10 mg total) by mouth daily. 30 tablet 6  . ammonium lactate (LAC-HYDRIN) 12 % cream Apply topically as needed for  dry skin. 385 g 0  . Biotin 1000 MCG tablet Take 1,000 mcg by mouth daily.     . hydroquinone 4 % cream Apply topically 2 (two) times daily. To dark areas of face. Use of SPF 30 or 50 sunscreen moisturizer 28.35 g 0  . L-LYSINE PO Take 1 tablet by mouth daily.     . metoprolol tartrate (LOPRESSOR) 50 MG tablet Take 1 tablet (50 mg total) by mouth 2 (two) times daily. 60 tablet 11  . Misc. Devices (CANE) MISC 1 each by Does not apply route daily. 1 each 0  . Misc. Devices (HUGO ROLLING WALKER BASIC) MISC 1 each by Does not apply route daily. 1 each 0  . triamcinolone ointment (KENALOG) 0.5 % APPLY 1 APPLICATION TOPICALLY 2 TIMES DAILY. APPLY TO HANDS 30 g 2  . traZODone (DESYREL) 50 MG tablet Take 1 tablet (50 mg total) by mouth at bedtime. 90 tablet 1  . venlafaxine XR (EFFEXOR XR) 37.5 MG 24 hr  capsule Take 1 capsule (37.5 mg total) by mouth daily. 90 capsule 1   No current facility-administered medications for this visit.     Neurologic: Headache: Negative Seizure: Negative Paresthesias:Negative  Musculoskeletal: Strength & Muscle Tone: within normal limits Gait & Station: shuffle Patient leans: N/A  Psychiatric Specialty Exam: Review of Systems  Constitutional: Negative.   HENT: Negative.   Eyes: Negative.   Respiratory: Negative.   Cardiovascular: Negative.   Musculoskeletal: Positive for back pain, joint pain and myalgias.  Neurological: Negative.   Psychiatric/Behavioral: Positive for depression. The patient has insomnia.     Blood pressure 126/68, pulse 69, height 5' 4.5" (1.638 m), weight 171 lb (77.6 kg), SpO2 95 %.Body mass index is 28.9 kg/m.  General Appearance: Casual and Fairly Groomed  Eye Contact:  Fair  Speech:  Clear and Coherent  Volume:  Normal  Mood:  Depressed and Dysphoric  Affect:  Constricted and Depressed  Thought Process:  Coherent and Descriptions of Associations: Intact  Orientation:  Full (Time, Place, and Person)  Thought Content:  Logical   Suicidal Thoughts:  No  Homicidal Thoughts:  No  Memory:  Immediate;   Good  Judgement:  Good  Insight:  Fair  Psychomotor Activity:  Normal  Concentration:  Concentration: Fair  Recall:  [place] of Knowledge:Good  Language: Good  Akathisia:  Negative  Handed:  Right  AIMS (if indicated):  na  Assets:  Communication Skills Desire for Improvement Financial Resources/Insurance Housing Leisure Time Social Support Transportation Vocational/Educational  ADL's:  Intact  Cognition: WNL  Sleep:  Poor, awakenings    Treatment Plan Summary: MONICK RENA is a 62 year old female with a recent history of increased medical problems, sciatic nerve pain, and increased debility due to arthritis, who presents today for psychiatric intake assessment.  She has had increasingly depressed mood over the past 6-12 months, and presents with symptoms consistent with an episode of major depression.  She has no prior psychiatric treatment history, and no history of substance abuse or other contributing factors at this time.  I educated her about major depressive disorder and suggested we proceed as below.  She is agreeable to the offered interventions and will follow-up in 3 months.  1. Severe episode of recurrent major depressive disorder, without psychotic features ([place])   2. Other insomnia     Status of current problems: new  Labs Ordered: No orders of the defined types were placed in this encounter.   Plan:  Initiate Effexor 37.5 mg XR daily Initiate trazodone 50 mg nightly Return to clinic in 3 months Referral for individual therapy  I spent 40 minutes with the patient in direct face-to-face clinical care.  Greater than 50% of this time was spent in counseling and coordination of care with the patient.    Aundra Dubin, MD 1/28/20193:57 PM

## 2017-10-26 ENCOUNTER — Ambulatory Visit (HOSPITAL_COMMUNITY): Payer: Medicaid Other | Admitting: Psychiatry

## 2017-10-28 ENCOUNTER — Inpatient Hospital Stay
Admission: RE | Admit: 2017-10-28 | Discharge: 2017-10-28 | Disposition: A | Discharge status: Home / Self Care | Payer: Self-pay | Source: Ambulatory Visit | Attending: Internal Medicine | Admitting: Internal Medicine

## 2017-10-28 ENCOUNTER — Ambulatory Visit: Payer: Self-pay

## 2017-11-16 ENCOUNTER — Ambulatory Visit: Payer: Self-pay

## 2017-11-16 ENCOUNTER — Inpatient Hospital Stay
Admission: RE | Admit: 2017-11-16 | Discharge: 2017-11-16 | Disposition: A | Discharge status: Home / Self Care | Payer: Self-pay | Source: Ambulatory Visit | Attending: Internal Medicine | Admitting: Internal Medicine

## 2017-12-03 ENCOUNTER — Inpatient Hospital Stay
Admission: RE | Admit: 2017-12-03 | Discharge: 2017-12-03 | Disposition: A | Discharge status: Home / Self Care | Payer: Self-pay | Source: Ambulatory Visit | Attending: Internal Medicine | Admitting: Internal Medicine

## 2017-12-03 ENCOUNTER — Ambulatory Visit: Payer: Self-pay

## 2017-12-21 ENCOUNTER — Encounter (HOSPITAL_COMMUNITY): Payer: Self-pay | Admitting: Psychiatry

## 2017-12-21 ENCOUNTER — Ambulatory Visit (INDEPENDENT_AMBULATORY_CARE_PROVIDER_SITE_OTHER): Payer: Medicaid Other | Admitting: Psychiatry

## 2017-12-21 VITALS — BP 118/78 | HR 80 | Ht 64.5 in | Wt 173.8 lb

## 2017-12-21 DIAGNOSIS — M5416 Radiculopathy, lumbar region: Secondary | ICD-10-CM | POA: Diagnosis not present

## 2017-12-21 DIAGNOSIS — F17210 Nicotine dependence, cigarettes, uncomplicated: Secondary | ICD-10-CM

## 2017-12-21 DIAGNOSIS — F332 Major depressive disorder, recurrent severe without psychotic features: Secondary | ICD-10-CM | POA: Diagnosis not present

## 2017-12-21 DIAGNOSIS — M25511 Pain in right shoulder: Secondary | ICD-10-CM | POA: Diagnosis not present

## 2017-12-21 DIAGNOSIS — G8929 Other chronic pain: Secondary | ICD-10-CM | POA: Diagnosis not present

## 2017-12-21 DIAGNOSIS — M25512 Pain in left shoulder: Secondary | ICD-10-CM

## 2017-12-21 MED ORDER — AMITRIPTYLINE HCL 10 MG PO TABS: 10.0000 mg | ORAL_TABLET | Freq: Every day | ORAL | 2 refills | Status: DC

## 2017-12-21 NOTE — Progress Notes (Signed)
BH MD/PA/NP OP Progress Note  12/21/2017 3:23 PM Meredith Church  MRN:  [id number]  Chief Complaint: med management follow-up  HPI: Meredith Church use with irritability, depression, as a function of chronic pain, difficulty with sleeping, and feeling discouraged about her physical deterioration.  She reports that the Effexor and trazodone made her feel very weird, and she would like to discontinue.  We discussed the use of Elavil for chronic pain and insomnia, and agreed to start a very low dose of 10 mg nightly.  She continues with chronic bilateral shoulder pain, difficulty with moving her arms due to pain in her shoulders.  She reports that if her pain and muscle aches and bone aches were better, a lot of her depression would be better.  I referred her to rheumatology which she was very agreeable to.  Visit Diagnosis:    ICD-10-CM   1. Severe episode of recurrent major depressive disorder, without psychotic features ([place]) F33.2 amitriptyline (ELAVIL) 10 MG tablet  2. Chronic radicular pain of lower back M54.16 amitriptyline (ELAVIL) 10 MG tablet   G89.29 Ambulatory referral to Rheumatology  3. Pain of both shoulder joints M25.511 Ambulatory referral to Rheumatology   M25.512     Past Psychiatric History: See intake H&P for full details. Reviewed, with no updates at this time.   Past Medical History:  Past Medical History:  Diagnosis Date  . Anxiety Dx 2007  . Arthritis   . COPD (chronic obstructive pulmonary disease) ([place])   . Eczema   . Hyperlipidemia Dx 2010  . Hypertension Dx 2010  . Numbness and tingling of lower extremity     Past Surgical History:  Procedure Laterality Date  . ABDOMINAL HYSTERECTOMY    . FRACTURE SURGERY     Left pinky finger  . LIPOMA EXCISION    . LUMBAR LAMINECTOMY/DECOMPRESSION MICRODISCECTOMY N/A 07/03/2016   Procedure: LUMBAR FOUR - LUMBAR FIVE LAMINECTOMY/DISKECTOMY;  Surgeon: [name] [name], MD;  Location: [facility];  Service:  Neurosurgery;  Laterality: N/A;  LUMBAR FOUR - LUMBAR FIVE LAMINECTOMY/DISKECTOMY  . SALIVARY GLAND SURGERY    . sciatica     Dr. Sharmaine Base oct. 2017  . TIBIA FRACTURE SURGERY     right  . TOTAL HIP ARTHROPLASTY Right 02/24/2017   Procedure: RIGHT TOTAL HIP ARTHROPLASTY ANTERIOR APPROACH;  Surgeon: Paralee Cancel, MD;  Location: [facility];  Service: Orthopedics;  Laterality: Right;    Family Psychiatric History: See intake H&P for full details. Reviewed, with no updates at this time.   Family History:  Family History  Problem Relation Age of Onset  . Diabetes Mother   . Diabetes Sister   . Cancer Brother        colon cancer     Social History:  Social History   Socioeconomic History  . Marital status: Single    Spouse name: Not on file  . Number of children: Not on file  . Years of education: Not on file  . Highest education level: Not on file  Occupational History  . Not on file  Social Needs  . Financial resource strain: Not on file  . Food insecurity:    Worry: Not on file    Inability: Not on file  . Transportation needs:    Medical: Not on file    Non-medical: Not on file  Tobacco Use  . Smoking status: Current Every Day Smoker    Packs/day: 0.20    Years: 30.00    Pack years: 6.00  Types: Cigarettes  . Smokeless tobacco: Never Used  . Tobacco comment: Smokes socially.  trying to quit with nic. patch  Substance and Sexual Activity  . Alcohol use: Yes    Alcohol/week: 0.0 oz    Comment: social  . Drug use: No  . Sexual activity: Yes    Partners: Male  Lifestyle  . Physical activity:    Days per week: Not on file    Minutes per session: Not on file  . Stress: Not on file  Relationships  . Social connections:    Talks on phone: Not on file    Gets together: Not on file    Attends religious service: Not on file    Active member of club or organization: Not on file    Attends meetings of clubs or organizations: Not on file    Relationship status: Not on  file  Other Topics Concern  . Not on file  Social History Narrative  . Not on file    Allergies:  Allergies  Allergen Reactions  . Other Other (See Comments)     Nickel "breaks my skin out" Fire ants    Metabolic Disorder Labs: Lab Results  Component Value Date   HGBA1C 5.9 10/03/2013   No results found for: PROLACTIN Lab Results  Component Value Date   CHOL 188 10/03/2013   TRIG 267 (H) 10/03/2013   HDL 42 10/03/2013   CHOLHDL 4.5 10/03/2013   VLDL 53 (H) 10/03/2013   LDLCALC 93 10/03/2013   Lab Results  Component Value Date   TSH 0.936 10/03/2013    Therapeutic Level Labs: No results found for: LITHIUM No results found for: VALPROATE No components found for:  CBMZ  Current Medications: Current Outpatient Medications  Medication Sig Dispense Refill  . albuterol (PROVENTIL HFA;VENTOLIN HFA) 108 (90 Base) MCG/ACT inhaler Inhale 2 puffs into the lungs every 6 (six) hours as needed for wheezing or shortness of breath. 1 Inhaler 6  . amLODipine (NORVASC) 10 MG tablet Take 1 tablet (10 mg total) by mouth daily. 30 tablet 6  . ammonium lactate (LAC-HYDRIN) 12 % cream Apply topically as needed for dry skin. 385 g 0  . Biotin 1000 MCG tablet Take 1,000 mcg by mouth daily.     . hydroquinone 4 % cream Apply topically 2 (two) times daily. To dark areas of face. Use of SPF 30 or 50 sunscreen moisturizer 28.35 g 0  . L-LYSINE PO Take 1 tablet by mouth daily.     . metoprolol tartrate (LOPRESSOR) 50 MG tablet Take 1 tablet (50 mg total) by mouth 2 (two) times daily. 60 tablet 11  . Misc. Devices (CANE) MISC 1 each by Does not apply route daily. 1 each 0  . Misc. Devices (HUGO ROLLING WALKER BASIC) MISC 1 each by Does not apply route daily. 1 each 0  . triamcinolone ointment (KENALOG) 0.5 % APPLY 1 APPLICATION TOPICALLY 2 TIMES DAILY. APPLY TO HANDS 30 g 2  . amitriptyline (ELAVIL) 10 MG tablet Take 1 tablet (10 mg total) by mouth at bedtime. 30 tablet 2   No current  facility-administered medications for this visit.      Musculoskeletal: Strength & Muscle Tone: decreased Gait & Station: unsteady, shuffle Patient leans: N/A  Psychiatric Specialty Exam: ROS  Blood pressure 118/78, pulse 80, height 5' 4.5" (1.638 m), weight 173 lb 12.8 oz (78.8 kg).Body mass index is 29.37 kg/m.  General Appearance: Casual and Fairly Groomed  Eye Contact:  Fair  Speech:  Clear and Coherent, Normal Rate and wears dentures  Volume:  Normal  Mood:  Dysphoric  Affect:  Congruent  Thought Process:  Goal Directed and Descriptions of Associations: Intact  Orientation:  Full (Time, Place, and Person)  Thought Content: Logical   Suicidal Thoughts:  No  Homicidal Thoughts:  No  Memory:  Immediate;   Fair  Judgement:  Fair  Insight:  Fair  Psychomotor Activity:  Normal  Concentration:  Concentration: Poor  Recall:  [employer] of Knowledge: Fair  Language: Fair  Akathisia:  Negative  Handed:  Right  AIMS (if indicated): not done  Assets:  Communication Skills Desire for Improvement Financial Resources/Insurance Housing  ADL's:  Intact  Cognition: WNL  Sleep:  Poor   Screenings: GAD-7     Office Visit from 04/13/2017 in [facility] Office Visit from 11/28/2016 in [facility] Office Visit from 11/14/2016 in [facility] Office Visit from 09/02/2016 in [facility] Office Visit from 08/15/2016 in [facility]  Total GAD-7 Score  18  18  20  19  20     PHQ2-9     Office Visit from 04/13/2017 in [facility] Office Visit from 11/28/2016 in [facility] Office Visit from 11/14/2016 in [facility] Office Visit from 09/02/2016 in [facility] Office Visit from 08/15/2016 in [facility]   PHQ-2 Total Score  6  5  6  4  4   PHQ-9 Total Score  18  17  21  14  16        Assessment and Plan:  Meredith Church presents for medication management for depression primarily due to chronic medical issues, which have substantially affected her quality of life and contributed to chronic pain.  She had a negative reaction to Effexor and felt more irritable.  We discussed initiating Elavil for antidepressant, chronic pain, insomnia and to discontinue trazodone and Effexor.  She agrees to proceed as below and follow-up in 6-8 weeks.  I also made a rheumatology referral for her, given her chronic musculoskeletal pain and ongoing concerns about inflammatory arthropathies.  1. Severe episode of recurrent major depressive disorder, without psychotic features ([place])   2. Chronic radicular pain of lower back   3. Pain of both shoulder joints     Status of current problems: unchanged  Labs Ordered: Orders Placed This Encounter  Procedures  . Ambulatory referral to Rheumatology    Referral Priority:   Routine    Referral Type:   Consultation    Referral Reason:   Specialty Services Required    Referred to Provider:   Bo Merino, MD    Requested Specialty:   Rheumatology    Number of Visits Requested:   1    Labs Reviewed: n/a  Collateral Obtained/Records Reviewed: n/a  Plan:  Elavil 10 mg nightly Trazodone and Effexor discontinued Turn to clinic in 6-8 weeks  I spent 20 minutes with the patient in direct face-to-face clinical care.  Greater than 50% of this time was spent in counseling and coordination of care with the patient.    Aundra Dubin, MD 12/21/2017, 3:23 PM

## 2017-12-22 ENCOUNTER — Ambulatory Visit (INDEPENDENT_AMBULATORY_CARE_PROVIDER_SITE_OTHER): Payer: Medicaid Other | Admitting: Licensed Clinical Social Worker

## 2017-12-22 ENCOUNTER — Encounter (HOSPITAL_COMMUNITY): Payer: Self-pay | Admitting: Licensed Clinical Social Worker

## 2017-12-22 DIAGNOSIS — F332 Major depressive disorder, recurrent severe without psychotic features: Secondary | ICD-10-CM
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
## 2017-12-29 ENCOUNTER — Inpatient Hospital Stay
Admission: RE | Admit: 2017-12-29 | Discharge: 2017-12-29 | Disposition: A | Discharge status: Home / Self Care | Payer: Self-pay | Source: Ambulatory Visit | Attending: Internal Medicine | Admitting: Internal Medicine

## 2017-12-29 ENCOUNTER — Ambulatory Visit: Payer: Self-pay

## 2018-01-11 ENCOUNTER — Telehealth (HOSPITAL_COMMUNITY): Payer: Self-pay

## 2018-01-11 NOTE — Telephone Encounter (Signed)
Patient called because she was checking on referral to Dr. Estanislado Pandy. I explained to the patient that she was denied by Dr. Estanislado Pandy because she has osteoarthritis and not rheumatoid arthritis. Patient wants to [facility] where to go from here. She was seeing Dr. Erlinda Hong at [facility], but did not care for him. She is already in pain management. I told her I would discuss with you and get back to her.

## 2018-01-12 NOTE — Telephone Encounter (Signed)
Apparently her pcp office has a rheumatologist on staff, so id suggest she ask her pcp if she can consult with them.

## 2018-01-26 ENCOUNTER — Encounter

## 2018-01-26 ENCOUNTER — Encounter (HOSPITAL_COMMUNITY): Payer: Self-pay | Admitting: Licensed Clinical Social Worker

## 2018-01-26 ENCOUNTER — Ambulatory Visit (INDEPENDENT_AMBULATORY_CARE_PROVIDER_SITE_OTHER): Payer: Medicaid Other | Admitting: Licensed Clinical Social Worker

## 2018-01-26 ENCOUNTER — Telehealth (HOSPITAL_COMMUNITY): Payer: Self-pay

## 2018-01-26 DIAGNOSIS — F332 Major depressive disorder, recurrent severe without psychotic features: Secondary | ICD-10-CM | POA: Diagnosis not present

## 2018-01-26 NOTE — Progress Notes (Signed)
   THERAPIST PROGRESS NOTE  Session Time: 3:10-4pm  Participation Level: Active  Behavioral Response: CasualAlertDepressed  Type of Therapy: Individual Therapy  Treatment Goals addressed:  Depressive symptoms  Interventions: CBT and Motivational Interviewing  Summary: Meredith Church is a 62 y.o. female who presents for her individual counseling session. Spent a considerable amount of time building a trusting therapeutic relationship. Pt has chronic pain which she terms as overwhelming. She continuously rubbed her arms and shoulders in session. She sees Dr. Daron Offer psychiatrist who referred her for therapy. Discussed the diagnosis of MDD and depressive symptoms. Discussed the importance of self care. Pt completed PHQ9 screening tool.  Suicidal/Homicidal: Nowithout intent/plan  Therapist Response: Assessed pt'Church current functioning and reviewed progress. Assisted pt building a trusting therapeutic relationship. Provided psycheducation on MDD and depressive symptoms. Assisted pt processing the importance of self care.  Plan: Return again in 2 weeks. Review PHQ9 results.  Diagnosis: Axis I: MDD    Meredith Church, LCAS 01/26/2018

## 2018-01-26 NOTE — Telephone Encounter (Signed)
Patient came in to see Beth today, she was denied service by Dr. Estanislado Pandy whom we had referred her to - Dr. Estanislado Pandy stated the patient needs an Ortho for Osteo arthritis. I had explained all of this to the patient, also explaining that because of her medicaid, she would need to be referred by PCP. I called the PCP today because when patient came to see Beth she had some confusion about what she was supposed to do. I was not able to get a live person, but I left a voice mail on the nurse line.

## 2018-02-03 ENCOUNTER — Ambulatory Visit: Payer: Self-pay

## 2018-02-03 ENCOUNTER — Inpatient Hospital Stay: Admission: RE | Admit: 2018-02-03 | Payer: Self-pay | Source: Ambulatory Visit

## 2018-02-10 ENCOUNTER — Encounter (HOSPITAL_COMMUNITY): Payer: Self-pay | Admitting: Licensed Clinical Social Worker

## 2018-02-10 ENCOUNTER — Ambulatory Visit (INDEPENDENT_AMBULATORY_CARE_PROVIDER_SITE_OTHER): Payer: Medicaid Other | Admitting: Licensed Clinical Social Worker

## 2018-02-10 DIAGNOSIS — F332 Major depressive disorder, recurrent severe without psychotic features: Secondary | ICD-10-CM | POA: Diagnosis not present

## 2018-02-10 NOTE — Progress Notes (Signed)
   THERAPIST PROGRESS NOTE  Session Time: 3:10-4pm  Participation Level: Active  Behavioral Response: CasualAlertDepressed  Type of Therapy: Individual Therapy  Treatment Goals addressed:  Depressive symptoms  Interventions: CBT and Motivational Interviewing  Summary: Meredith Church is a 63 y.o. female who presents for her individual counseling session.  Pt reports she is taking her medications as prescribed. Pt discussed her psychiatric symptoms and current life events. Pt reports her air conditioning is currently not working. It has made her chronic pain much worse, not sleeping, frustrated. Pt is angry at herself for not being prepared for emergencies. Assisted pt with "preparing for emergency self care list." Discussed with pt the importance of pre planning for emergencies. Pt became so stressed assisted her with deep breathing. Pt completed the PHQ9 and reviewed in session.       Suicidal/Homicidal: Nowithout intent/plan  Therapist Response: Assessed pt's current functioning and reviewed progress.Assisted pt with pre planning for emergencies, importance of self care, reviewed PHQ9 . Assisted pt processing  For the management of her stressors.  Plan: Return again in 2 weeks.    Diagnosis: Axis I: MDD    MACKENZIE,LISBETH S, LCAS 02/10/2018

## 2018-02-17 ENCOUNTER — Encounter (HOSPITAL_COMMUNITY): Payer: Self-pay | Admitting: Psychiatry

## 2018-02-17 ENCOUNTER — Ambulatory Visit (INDEPENDENT_AMBULATORY_CARE_PROVIDER_SITE_OTHER): Payer: Medicaid Other | Admitting: Psychiatry

## 2018-02-17 DIAGNOSIS — F332 Major depressive disorder, recurrent severe without psychotic features: Secondary | ICD-10-CM | POA: Diagnosis not present

## 2018-02-17 DIAGNOSIS — Z598 Other problems related to housing and economic circumstances: Secondary | ICD-10-CM | POA: Diagnosis not present

## 2018-02-17 DIAGNOSIS — M5416 Radiculopathy, lumbar region: Secondary | ICD-10-CM | POA: Diagnosis not present

## 2018-02-17 DIAGNOSIS — G8929 Other chronic pain: Secondary | ICD-10-CM

## 2018-02-17 DIAGNOSIS — F17210 Nicotine dependence, cigarettes, uncomplicated: Secondary | ICD-10-CM | POA: Diagnosis not present

## 2018-02-17 MED ORDER — AMITRIPTYLINE HCL 25 MG PO TABS: 25.0000 mg | ORAL_TABLET | Freq: Every day | ORAL | 0 refills | Status: DC

## 2018-02-17 NOTE — Progress Notes (Signed)
[place] MD/PA/NP OP Progress Note  02/17/2018 4:46 PM [name]  MRN:  [id number]  Chief Complaint: med management follow-up  HPI: Meredith Church continues to reflect on significant financial constraints and how this affects her life quite negatively.  I spent time with her processing this and validating that financial stressors are certainly a contributing factor to depression.  She has not had any side effects from the Elavil so we agreed to increase further to 25 mg for her chronic back pain and severe depressive symptoms.  She has had some slight benefits with sleep.  She denies any suicidality and continues to try to work on noticing the positive things in her life, and is working on trying to find a job.  Visit Diagnosis:    ICD-10-CM   1. Severe episode of recurrent major depressive disorder, without psychotic features ([place]) F33.2 amitriptyline (ELAVIL) 25 MG tablet  2. Chronic radicular pain of lower back M54.16 amitriptyline (ELAVIL) 25 MG tablet   G89.29     Past Psychiatric History: See intake H&P for full details. Reviewed, with no updates at this time.   Past Medical History:  Past Medical History:  Diagnosis Date  . Anxiety Dx 2007  . Arthritis   . COPD (chronic obstructive pulmonary disease) ([place])   . Eczema   . Hyperlipidemia Dx 2010  . Hypertension Dx 2010  . Numbness and tingling of lower extremity     Past Surgical History:  Procedure Laterality Date  . ABDOMINAL HYSTERECTOMY    . FRACTURE SURGERY     Left pinky finger  . LIPOMA EXCISION    . LUMBAR LAMINECTOMY/DECOMPRESSION MICRODISCECTOMY N/A 07/03/2016   Procedure: LUMBAR FOUR - LUMBAR FIVE LAMINECTOMY/DISKECTOMY;  Surgeon: [name] [name], MD;  Location: [facility];  Service: Neurosurgery;  Laterality: N/A;  LUMBAR FOUR - LUMBAR FIVE LAMINECTOMY/DISKECTOMY  . SALIVARY GLAND SURGERY    . sciatica     Dr. Sharmaine Base oct. 2017  . TIBIA FRACTURE SURGERY     right  . TOTAL HIP ARTHROPLASTY Right 02/24/2017    Procedure: RIGHT TOTAL HIP ARTHROPLASTY ANTERIOR APPROACH;  Surgeon: Paralee Cancel, MD;  Location: [facility];  Service: Orthopedics;  Laterality: Right;    Family Psychiatric History: See intake H&P for full details. Reviewed, with no updates at this time.   Family History:  Family History  Problem Relation Age of Onset  . Diabetes Mother   . Diabetes Sister   . Cancer Brother        colon cancer     Social History:  Social History   Socioeconomic History  . Marital status: Single    Spouse name: Not on file  . Number of children: Not on file  . Years of education: Not on file  . Highest education level: Not on file  Occupational History  . Not on file  Social Needs  . Financial resource strain: Not on file  . Food insecurity:    Worry: Not on file    Inability: Not on file  . Transportation needs:    Medical: Not on file    Non-medical: Not on file  Tobacco Use  . Smoking status: Current Every Day Smoker    Packs/day: 0.20    Years: 30.00    Pack years: 6.00    Types: Cigarettes  . Smokeless tobacco: Never Used  . Tobacco comment: Smokes socially.  trying to quit with nic. patch  Substance and Sexual Activity  . Alcohol use: Yes  Alcohol/week: 0.0 oz    Comment: social  . Drug use: No  . Sexual activity: Yes    Partners: Male  Lifestyle  . Physical activity:    Days per week: Not on file    Minutes per session: Not on file  . Stress: Not on file  Relationships  . Social connections:    Talks on phone: Not on file    Gets together: Not on file    Attends religious service: Not on file    Active member of club or organization: Not on file    Attends meetings of clubs or organizations: Not on file    Relationship status: Not on file  Other Topics Concern  . Not on file  Social History Narrative  . Not on file    Allergies:  Allergies  Allergen Reactions  . Other Other (See Comments)     Nickel "breaks my skin out" Fire ants    Metabolic  Disorder Labs: Lab Results  Component Value Date   HGBA1C 5.9 10/03/2013   No results found for: PROLACTIN Lab Results  Component Value Date   CHOL 188 10/03/2013   TRIG 267 (H) 10/03/2013   HDL 42 10/03/2013   CHOLHDL 4.5 10/03/2013   VLDL 53 (H) 10/03/2013   LDLCALC 93 10/03/2013   Lab Results  Component Value Date   TSH 0.936 10/03/2013    Therapeutic Level Labs: No results found for: LITHIUM No results found for: VALPROATE No components found for:  CBMZ  Current Medications: Current Outpatient Medications  Medication Sig Dispense Refill  . albuterol (PROVENTIL HFA;VENTOLIN HFA) 108 (90 Base) MCG/ACT inhaler Inhale 2 puffs into the lungs every 6 (six) hours as needed for wheezing or shortness of breath. 1 Inhaler 6  . amitriptyline (ELAVIL) 25 MG tablet Take 1 tablet (25 mg total) by mouth at bedtime. 90 tablet 0  . amLODipine (NORVASC) 10 MG tablet Take 1 tablet (10 mg total) by mouth daily. 30 tablet 6  . ammonium lactate (LAC-HYDRIN) 12 % cream Apply topically as needed for dry skin. 385 g 0  . Biotin 1000 MCG tablet Take 1,000 mcg by mouth daily.     . hydroquinone 4 % cream Apply topically 2 (two) times daily. To dark areas of face. Use of SPF 30 or 50 sunscreen moisturizer 28.35 g 0  . L-LYSINE PO Take 1 tablet by mouth daily.     . metoprolol tartrate (LOPRESSOR) 50 MG tablet Take 1 tablet (50 mg total) by mouth 2 (two) times daily. 60 tablet 11  . Misc. Devices (CANE) MISC 1 each by Does not apply route daily. 1 each 0  . Misc. Devices (HUGO ROLLING WALKER BASIC) MISC 1 each by Does not apply route daily. 1 each 0  . triamcinolone ointment (KENALOG) 0.5 % APPLY 1 APPLICATION TOPICALLY 2 TIMES DAILY. APPLY TO HANDS 30 g 2   No current facility-administered medications for this visit.      Musculoskeletal: Strength & Muscle Tone: decreased Gait & Station: unsteady, shuffle Patient leans: N/A  Psychiatric Specialty Exam: ROS  Blood pressure 127/82, pulse 79,  height 5' 4.5" (1.638 m), weight 169 lb 6.4 oz (76.8 kg), SpO2 94 %.Body mass index is 28.63 kg/m.  General Appearance: Casual and Fairly Groomed  Eye Contact:  Fair  Speech:  Clear and Coherent, Normal Rate and wears dentures  Volume:  Normal  Mood:  Dysphoric  Affect:  Congruent  Thought Process:  Goal Directed and Descriptions of Associations:  Intact  Orientation:  Full (Time, Place, and Person)  Thought Content: Logical   Suicidal Thoughts:  No  Homicidal Thoughts:  No  Memory:  Immediate;   Fair  Judgement:  Fair  Insight:  Fair  Psychomotor Activity:  Normal  Concentration:  Concentration: Poor  Recall:  [employer] of Knowledge: Fair  Language: Fair  Akathisia:  Negative  Handed:  Right  AIMS (if indicated): not done  Assets:  Communication Skills Desire for Improvement Financial Resources/Insurance Housing  ADL's:  Intact  Cognition: WNL  Sleep:  Poor   Screenings: GAD-7     Office Visit from 04/13/2017 in [place] Office Visit from 11/28/2016 in [place] Office Visit from 11/14/2016 in [place] Office Visit from 09/02/2016 in [place] Office Visit from 08/15/2016 in [place]  Total GAD-7 Score  18  18  20  19  20     PHQ2-9     Counselor from 02/10/2018 in [place] Counselor from 01/26/2018 in [place] Office Visit from 04/13/2017 in [place] Office Visit from 11/28/2016 in [facility] Office Visit from 11/14/2016 in [facility] [place]  PHQ-2 Total Score  6  6  6  5  6   PHQ-9 Total Score  16  17  18  17  21        Assessment and Plan:  ZANNIE LOCASTRO presents for medication management.  She has tolerated Elavil and we agreed to increase to 25 mg  nightly.  She does not present with any safety concerns or suicidality.  Spent time with her validating some of her external stressors and financial stressors but also redirecting to be able to think about what elements of the mental health care we can work on and control in the context of the care setting. she continues to work with her individual therapist on noticing some of the positive things in her life.  1. Severe episode of recurrent major depressive disorder, without psychotic features ([place])   2. Chronic radicular pain of lower back     Status of current problems: unchanged  Labs Ordered: No orders of the defined types were placed in this encounter.   Labs Reviewed: n/a  Collateral Obtained/Records Reviewed: n/a  Plan:  Elavil 25 mg nightly Turn to clinic in 6-8 weeks  I spent 20 minutes with the patient in direct face-to-face clinical care.  Greater than 50% of this time was spent in counseling and coordination of care with the patient.

## 2018-02-24 ENCOUNTER — Ambulatory Visit (INDEPENDENT_AMBULATORY_CARE_PROVIDER_SITE_OTHER): Payer: Medicaid Other | Admitting: Licensed Clinical Social Worker

## 2018-02-24 ENCOUNTER — Encounter (HOSPITAL_COMMUNITY): Payer: Self-pay | Admitting: Licensed Clinical Social Worker

## 2018-02-24 DIAGNOSIS — F332 Major depressive disorder, recurrent severe without psychotic features: Secondary | ICD-10-CM | POA: Diagnosis not present

## 2018-02-24 NOTE — Progress Notes (Signed)
   THERAPIST PROGRESS NOTE  Session Time: 3:10-4pm  Participation Level: Active  Behavioral Response: CasualAlertDepressed  Type of Therapy: Individual Therapy  Treatment Goals addressed:  Depressive symptoms  Interventions: CBT and Motivational Interviewing  Summary: Meredith Church is a 62 y.o. female who presents for her individual counseling session.  Pt reports she is taking her medications as prescribed. Pt discussed her psychiatric symptoms and current life events. Pt reports she just saw her psychiatrist and no medications were changed. She thinks her moods have stabilized somewhat. Pt acted on suggestions of last session: seeing life in a more positive light, making a gratitude list. Pt reports she heard from SS/Disability and her hearing is set for 8/1. Asked open ended questions. Pt is interested in getting a part time job. Gave pt information on job hunting and also suggested she go the the [employer] where they will assist her. Pt was open to suggestions and recommendations. Gave pt journal to assist in her journaling and gratitude list.         Suicidal/Homicidal: Nowithout intent/plan  Therapist Response: Assessed pt's current functioning and reviewed progress.Assisted pt processing journaling, gratitude list, job hunting, referral to [facility],  Assisted pt processing  For the management of her stressors.  Plan: Return again in 2 weeks.    Diagnosis: Axis I: MDD    Zanaiya Calabria S, LCAS 02/24/2018

## 2018-03-08 ENCOUNTER — Encounter: Payer: Self-pay | Admitting: Obstetrics and Gynecology

## 2018-03-08 ENCOUNTER — Other Ambulatory Visit (HOSPITAL_COMMUNITY)
Admission: RE | Admit: 2018-03-08 | Discharge: 2018-03-08 | Disposition: A | Discharge status: Home / Self Care | Payer: Medicaid Other | Source: Ambulatory Visit | Attending: Obstetrics and Gynecology | Admitting: Obstetrics and Gynecology

## 2018-03-08 ENCOUNTER — Ambulatory Visit: Payer: Medicaid Other | Admitting: Obstetrics and Gynecology

## 2018-03-08 VITALS — BP 139/88 | HR 62 | Ht 64.0 in | Wt 168.2 lb

## 2018-03-08 DIAGNOSIS — Z01419 Encounter for gynecological examination (general) (routine) without abnormal findings: Secondary | ICD-10-CM

## 2018-03-08 DIAGNOSIS — Z113 Encounter for screening for infections with a predominantly sexual mode of transmission: Secondary | ICD-10-CM | POA: Insufficient documentation

## 2018-03-08 DIAGNOSIS — Z0000 Encounter for general adult medical examination without abnormal findings: Secondary | ICD-10-CM

## 2018-03-08 NOTE — Progress Notes (Signed)
Pt presents for annual. Pt requests to check for bacteria infection. She does not have sx's; however, was told by PCP to check for bacteria infections even is she doesn't have sx's.   MGM/Bone Density scheduled 03/24/18. Colonoscopy UTD per pt.

## 2018-03-08 NOTE — Progress Notes (Signed)
GYNECOLOGY ANNUAL PREVENTATIVE CARE ENCOUNTER NOTE  Subjective:   Meredith Church is a 62 y.o. [id number] female here for a annual gynecologic exam. Current complaints: none. Would like to be checked for any infection, denies vaginal discharge or discomfort. Denies abnormal vaginal bleeding, discharge, pelvic pain, problems with intercourse or other gynecologic concerns.    Gynecologic History No LMP recorded. Patient has had a hysterectomy. Contraception: status post hysterectomy Last Pap: many years ago. Results were: normal Last mammogram: 02/2016. Results were: Birads 1  Obstetric History OB History  Gravida Para Term Preterm AB Living  2 2 2     2   SAB TAB Ectopic Multiple Live Births          2    # Outcome Date GA Lbr Len/2nd Weight Sex Delivery Anes PTL Lv  2 Term      Vag-Spont     1 Term      Vag-Spont       Past Medical History:  Diagnosis Date  . Anxiety Dx 2007  . Arthritis   . COPD (chronic obstructive pulmonary disease) ([place])   . Eczema   . Hyperlipidemia Dx 2010  . Hypertension Dx 2010  . Numbness and tingling of lower extremity     Past Surgical History:  Procedure Laterality Date  . ABDOMINAL HYSTERECTOMY    . FRACTURE SURGERY     Left pinky finger  . LIPOMA EXCISION    . LUMBAR LAMINECTOMY/DECOMPRESSION MICRODISCECTOMY N/A 07/03/2016   Procedure: LUMBAR FOUR - LUMBAR FIVE LAMINECTOMY/DISKECTOMY;  Surgeon: [name] [name], MD;  Location: [facility];  Service: Neurosurgery;  Laterality: N/A;  LUMBAR FOUR - LUMBAR FIVE LAMINECTOMY/DISKECTOMY  . SALIVARY GLAND SURGERY    . sciatica     Dr. Sharmaine Base oct. 2017  . TIBIA FRACTURE SURGERY     right  . TOTAL HIP ARTHROPLASTY Right 02/24/2017   Procedure: RIGHT TOTAL HIP ARTHROPLASTY ANTERIOR APPROACH;  Surgeon: Paralee Cancel, MD;  Location: [facility];  Service: Orthopedics;  Laterality: Right;    Current Outpatient Medications on File Prior to Visit  Medication Sig Dispense Refill  . amitriptyline (ELAVIL) 25  MG tablet Take 1 tablet (25 mg total) by mouth at bedtime. 90 tablet 0  . amLODipine (NORVASC) 10 MG tablet Take 1 tablet (10 mg total) by mouth daily. 30 tablet 6  . ammonium lactate (LAC-HYDRIN) 12 % cream Apply topically as needed for dry skin. 385 g 0  . Biotin 1000 MCG tablet Take 1,000 mcg by mouth daily.     . hydroquinone 4 % cream Apply topically 2 (two) times daily. To dark areas of face. Use of SPF 30 or 50 sunscreen moisturizer 28.35 g 0  . L-LYSINE PO Take 1 tablet by mouth daily.     . metoprolol tartrate (LOPRESSOR) 50 MG tablet Take 1 tablet (50 mg total) by mouth 2 (two) times daily. 60 tablet 11  . Misc. Devices (CANE) MISC 1 each by Does not apply route daily. 1 each 0  . Misc. Devices (HUGO ROLLING WALKER BASIC) MISC 1 each by Does not apply route daily. 1 each 0  . triamcinolone ointment (KENALOG) 0.5 % APPLY 1 APPLICATION TOPICALLY 2 TIMES DAILY. APPLY TO HANDS 30 g 2  . albuterol (PROVENTIL HFA;VENTOLIN HFA) 108 (90 Base) MCG/ACT inhaler Inhale 2 puffs into the lungs every 6 (six) hours as needed for wheezing or shortness of breath. (Patient not taking: Reported on 03/08/2018) 1 Inhaler 6  . [DISCONTINUED] pravastatin (PRAVACHOL)  40 MG tablet Take 1 tablet (40 mg total) by mouth daily. 30 tablet 3   No current facility-administered medications on file prior to visit.     Allergies  Allergen Reactions  . Other Other (See Comments)     Nickel "breaks my skin out" Fire ants    Social History   Socioeconomic History  . Marital status: Single    Spouse name: Not on file  . Number of children: Not on file  . Years of education: Not on file  . Highest education level: Not on file  Occupational History  . Not on file  Social Needs  . Financial resource strain: Not on file  . Food insecurity:    Worry: Not on file    Inability: Not on file  . Transportation needs:    Medical: Not on file    Non-medical: Not on file  Tobacco Use  . Smoking status: Current Some  Day Smoker    Packs/day: 0.20    Years: 30.00    Pack years: 6.00    Types: Cigarettes  . Smokeless tobacco: Never Used  . Tobacco comment: Wears Nicotine patch   Substance and Sexual Activity  . Alcohol use: Yes    Alcohol/week: 0.0 oz    Comment: social  . Drug use: No  . Sexual activity: Yes    Partners: Male  Lifestyle  . Physical activity:    Days per week: Not on file    Minutes per session: Not on file  . Stress: Not on file  Relationships  . Social connections:    Talks on phone: Not on file    Gets together: Not on file    Attends religious service: Not on file    Active member of club or organization: Not on file    Attends meetings of clubs or organizations: Not on file    Relationship status: Not on file  . Intimate partner violence:    Fear of current or ex partner: Not on file    Emotionally abused: Not on file    Physically abused: Not on file    Forced sexual activity: Not on file  Other Topics Concern  . Not on file  Social History Narrative  . Not on file    Family History  Problem Relation Age of Onset  . Diabetes Mother   . Diabetes Sister   . Cancer Brother        colon cancer    Diet: states she has a good diet Exercise: plays pickleball at the community center, gardens  The following portions of the patient's history were reviewed and updated as appropriate: allergies, current medications, past family history, past medical history, past social history, past surgical history and problem list.  Review of Systems Pertinent items are noted in HPI.   Objective:  BP 139/88   Pulse 62   Ht 5\' 4"  (1.626 m)   Wt 168 lb 3.2 oz (76.3 kg)   BMI 28.87 kg/m  CONSTITUTIONAL: Well-developed, well-nourished female in no acute distress.  HENT:  Normocephalic, atraumatic, External right and left ear normal. Oropharynx is clear and moist EYES: Conjunctivae and EOM are normal. Pupils are equal, round, and reactive to light. No scleral icterus.  NECK:  Normal range of motion, supple, no masses.  Normal thyroid.  SKIN: Skin is warm and dry. No rash noted. Not diaphoretic. No erythema. No pallor. NEUROLOGIC: Alert and oriented to person, place, and time. Normal reflexes, muscle tone coordination. No  cranial nerve deficit noted. PSYCHIATRIC: Normal mood and affect. Normal behavior. Normal judgment and thought content. CARDIOVASCULAR: Normal heart rate noted, regular rhythm RESPIRATORY: Clear to auscultation bilaterally. Effort and breath sounds normal, no problems with respiration noted. BREASTS: Symmetric in size. No masses, skin changes, nipple drainage, or lymphadenopathy. ABDOMEN: Soft, normal bowel sounds, no distention noted.  No tenderness, rebound or guarding.  PELVIC: Normal appearing external genitalia; normal appearing vaginal mucosa, cervix surgically absent, No abnormal discharge noted. Uterus surgically absent, no other palpable masses, minimal left adnexal tenderness. MUSCULOSKELETAL: Normal range of motion. No tenderness.  No cyanosis, clubbing, or edema.  2+ distal pulses.   Assessment and Plan:   1. Well woman exam Benign exam Okay for STI screen Declines HIV/syphilis/Hep B&C  2. Routine screening for STI (sexually transmitted infection) - Cervicovaginal ancillary only   Will follow up results of STI screen and manage accordingly. Encouraged improvement in diet and exercise.  Mammogram scheduled Colonoscopy UTD   Routine preventative health maintenance measures emphasized. Please refer to After Visit Summary for other counseling recommendations.    Feliz Beam, M.D. Attending [place], [facility] for [employer], [place]

## 2018-03-09 LAB — CERVICOVAGINAL ANCILLARY ONLY
Bacterial vaginitis: POSITIVE — AB
Candida vaginitis: NEGATIVE
Chlamydia: NEGATIVE
Neisseria Gonorrhea: NEGATIVE
Trichomonas: NEGATIVE

## 2018-03-10 ENCOUNTER — Ambulatory Visit (HOSPITAL_COMMUNITY): Payer: Self-pay | Admitting: Licensed Clinical Social Worker

## 2018-03-11 ENCOUNTER — Telehealth: Payer: Self-pay | Admitting: Family Medicine

## 2018-03-11 ENCOUNTER — Other Ambulatory Visit: Payer: Self-pay

## 2018-03-11 DIAGNOSIS — I10 Essential (primary) hypertension: Secondary | ICD-10-CM

## 2018-03-11 MED ORDER — AMLODIPINE BESYLATE 10 MG PO TABS: 10.0000 mg | ORAL_TABLET | Freq: Every day | ORAL | 3 refills | Status: DC

## 2018-03-11 MED ORDER — METRONIDAZOLE 0.75 % VA GEL: 1.0000 | Freq: Every day | VAGINAL | 1 refills | Status: DC

## 2018-03-11 NOTE — Telephone Encounter (Signed)
Refill request for amLODipine (NORVASC) 10 MG tablet [[id number]]

## 2018-03-11 NOTE — Telephone Encounter (Signed)
Done

## 2018-03-11 NOTE — Addendum Note (Signed)
Addended by: Vivien Rota on: 03/11/2018 11:50 AM   Modules accepted: Orders

## 2018-03-24 ENCOUNTER — Encounter

## 2018-03-24 ENCOUNTER — Ambulatory Visit (INDEPENDENT_AMBULATORY_CARE_PROVIDER_SITE_OTHER): Payer: Medicaid Other | Admitting: Licensed Clinical Social Worker

## 2018-03-24 ENCOUNTER — Ambulatory Visit
Admission: RE | Admit: 2018-03-24 | Discharge: 2018-03-24 | Disposition: A | Discharge status: Home / Self Care | Payer: Medicaid Other | Source: Ambulatory Visit | Attending: Internal Medicine | Admitting: Internal Medicine

## 2018-03-24 ENCOUNTER — Inpatient Hospital Stay: Admission: RE | Admit: 2018-03-24 | Payer: Self-pay | Source: Ambulatory Visit

## 2018-03-24 ENCOUNTER — Encounter (HOSPITAL_COMMUNITY): Payer: Self-pay | Admitting: Licensed Clinical Social Worker

## 2018-03-24 DIAGNOSIS — F332 Major depressive disorder, recurrent severe without psychotic features: Secondary | ICD-10-CM

## 2018-03-24 DIAGNOSIS — Z1231 Encounter for screening mammogram for malignant neoplasm of breast: Secondary | ICD-10-CM

## 2018-03-24 NOTE — Progress Notes (Signed)
   THERAPIST PROGRESS NOTE  Session Time: 3:10-4pm  Participation Level: Active  Behavioral Response: CasualAlertDepressed  Type of Therapy: Individual Therapy  Treatment Goals addressed:  Depressive symptoms  Interventions: CBT and Motivational Interviewing  Summary: Meredith Church is a 62 y.o. female who presents for her individual counseling session.  Pt reports she is taking her medications as prescribed. Pt was moving slowly today as she is in extreme pain today. She sees her PCP tomorrow and will ask to be referred to a orthopedic dr. Pt reports her meds are making her groggy in the morning. Suggested she speak with her psychiatrist about the side effects. Pt Pt discussed how her chronic pain has increased her change in moods. Asked open ended questions. Discussed mindfulness skills with pt and practiced them in session. Suggested she practice between sessions. Pt's disability hearing is 8/1. Asked open ended questions.       Suicidal/Homicidal: Nowithout intent/plan  Therapist Response: Assessed pt's current functioning and reviewed progress.Assisted pt processing chronic pain, mood changes, medication side effects, upcoming disability hearing.Assisted pt processing  For the management of her stressors.  Plan: Return again in 2 weeks.    Diagnosis: Axis I: MDD    MACKENZIE,LISBETH S, LCAS 03/24/2018

## 2018-03-29 ENCOUNTER — Telehealth: Payer: Self-pay

## 2018-03-29 DIAGNOSIS — B379 Candidiasis, unspecified: Secondary | ICD-10-CM

## 2018-03-29 MED ORDER — FLUCONAZOLE 150 MG PO TABS
ORAL_TABLET | ORAL | 0 refills | Status: DC
Start: 2018-03-29 — End: 2019-05-15

## 2018-03-29 NOTE — Telephone Encounter (Signed)
Pt called stating the medication to treat the BV has given her a yeast infection. Sent rx diflucan to pts pharmacy per protocol, pt verbalized understanding.

## 2018-04-01 ENCOUNTER — Ambulatory Visit (INDEPENDENT_AMBULATORY_CARE_PROVIDER_SITE_OTHER): Payer: Medicaid Other | Admitting: Psychiatry

## 2018-04-01 ENCOUNTER — Encounter (HOSPITAL_COMMUNITY): Payer: Self-pay | Admitting: Psychiatry

## 2018-04-01 VITALS — BP 136/86 | HR 90 | Ht 64.0 in | Wt 167.0 lb

## 2018-04-01 DIAGNOSIS — G4709 Other insomnia: Secondary | ICD-10-CM | POA: Diagnosis not present

## 2018-04-01 DIAGNOSIS — F17210 Nicotine dependence, cigarettes, uncomplicated: Secondary | ICD-10-CM | POA: Diagnosis not present

## 2018-04-01 DIAGNOSIS — F411 Generalized anxiety disorder: Secondary | ICD-10-CM | POA: Diagnosis not present

## 2018-04-01 MED ORDER — ALPRAZOLAM 0.25 MG PO TABS
0.2500 mg | ORAL_TABLET | Freq: Two times a day (BID) | ORAL | 1 refills | Status: DC | PRN
Start: 2018-04-01 — End: 2018-07-07

## 2018-04-01 NOTE — Progress Notes (Signed)
[facility] MD/PA/NP OP Progress Note  04/01/2018 4:03 PM Meredith Church  MRN:  [id number]  Chief Complaint: med management follow-up  HPI: Meredith Church continues ongoing difficulty sleeping and feels that the Elavil lingers in her system so she does not like taking it.  She has been on Xanax in the past and reports that she knows it is very addictive and does not tend to be safe for long-term use but her quality of life is more important.  I educated her on the risks of benzodiazepines and we agreed to initiate low-dose nightly for insomnia and anxiety.  She would like to discontinue Elavil.  Denies any other acute concerns or safety issues and continues to work with Eustaquio Maize for individual therapy.    Visit Diagnosis:    ICD-10-CM   1. Other insomnia G47.09 ALPRAZolam (XANAX) 0.25 MG tablet  2. GAD (generalized anxiety disorder) F41.1 ALPRAZolam (XANAX) 0.25 MG tablet    Past Psychiatric History: See intake H&P for full details. Reviewed, with no updates at this time.   Past Medical History:  Past Medical History:  Diagnosis Date  . Anxiety Dx 2007  . Arthritis   . COPD (chronic obstructive pulmonary disease) ([place])   . Eczema   . Hyperlipidemia Dx 2010  . Hypertension Dx 2010  . Numbness and tingling of lower extremity     Past Surgical History:  Procedure Laterality Date  . ABDOMINAL HYSTERECTOMY    . FRACTURE SURGERY     Left pinky finger  . LIPOMA EXCISION    . LUMBAR LAMINECTOMY/DECOMPRESSION MICRODISCECTOMY N/A 07/03/2016   Procedure: LUMBAR FOUR - LUMBAR FIVE LAMINECTOMY/DISKECTOMY;  Surgeon: [name] [name], MD;  Location: [facility];  Service: Neurosurgery;  Laterality: N/A;  LUMBAR FOUR - LUMBAR FIVE LAMINECTOMY/DISKECTOMY  . SALIVARY GLAND SURGERY    . sciatica     Dr. Sharmaine Base oct. 2017  . TIBIA FRACTURE SURGERY     right  . TOTAL HIP ARTHROPLASTY Right 02/24/2017   Procedure: RIGHT TOTAL HIP ARTHROPLASTY ANTERIOR APPROACH;  Surgeon: Paralee Cancel, MD;  Location: [facility];   Service: Orthopedics;  Laterality: Right;    Family Psychiatric History: See intake H&P for full details. Reviewed, with no updates at this time.   Family History:  Family History  Problem Relation Age of Onset  . Diabetes Mother   . Diabetes Sister   . Cancer Brother        colon cancer     Social History:  Social History   Socioeconomic History  . Marital status: Single    Spouse name: Not on file  . Number of children: Not on file  . Years of education: Not on file  . Highest education level: Not on file  Occupational History  . Not on file  Social Needs  . Financial resource strain: Not on file  . Food insecurity:    Worry: Not on file    Inability: Not on file  . Transportation needs:    Medical: Not on file    Non-medical: Not on file  Tobacco Use  . Smoking status: Current Some Day Smoker    Packs/day: 0.20    Years: 30.00    Pack years: 6.00    Types: Cigarettes  . Smokeless tobacco: Never Used  . Tobacco comment: Wears Nicotine patch   Substance and Sexual Activity  . Alcohol use: Yes    Alcohol/week: 0.0 oz    Comment: social  . Drug use: No  . Sexual activity:  Yes    Partners: Male  Lifestyle  . Physical activity:    Days per week: Not on file    Minutes per session: Not on file  . Stress: Not on file  Relationships  . Social connections:    Talks on phone: Not on file    Gets together: Not on file    Attends religious service: Not on file    Active member of club or organization: Not on file    Attends meetings of clubs or organizations: Not on file    Relationship status: Not on file  Other Topics Concern  . Not on file  Social History Narrative  . Not on file    Allergies:  Allergies  Allergen Reactions  . Other Other (See Comments)     Nickel "breaks my skin out" Fire ants    Metabolic Disorder Labs: Lab Results  Component Value Date   HGBA1C 5.9 10/03/2013   No results found for: PROLACTIN Lab Results  Component Value  Date   CHOL 188 10/03/2013   TRIG 267 (H) 10/03/2013   HDL 42 10/03/2013   CHOLHDL 4.5 10/03/2013   VLDL 53 (H) 10/03/2013   LDLCALC 93 10/03/2013   Lab Results  Component Value Date   TSH 0.936 10/03/2013    Therapeutic Level Labs: No results found for: LITHIUM No results found for: VALPROATE No components found for:  CBMZ  Current Medications: Current Outpatient Medications  Medication Sig Dispense Refill  . albuterol (PROVENTIL HFA;VENTOLIN HFA) 108 (90 Base) MCG/ACT inhaler Inhale 2 puffs into the lungs every 6 (six) hours as needed for wheezing or shortness of breath. (Patient not taking: Reported on 03/08/2018) 1 Inhaler 6  . ALPRAZolam (XANAX) 0.25 MG tablet Take 1 tablet (0.25 mg total) by mouth 2 (two) times daily as needed for anxiety. 60 tablet 1  . amLODipine (NORVASC) 10 MG tablet Take 1 tablet (10 mg total) by mouth daily. 30 tablet 3  . ammonium lactate (LAC-HYDRIN) 12 % cream Apply topically as needed for dry skin. 385 g 0  . Biotin 1000 MCG tablet Take 1,000 mcg by mouth daily.     . fluconazole (DIFLUCAN) 150 MG tablet Can take additional dose three days later if symptoms persist 2 tablet 0  . hydroquinone 4 % cream Apply topically 2 (two) times daily. To dark areas of face. Use of SPF 30 or 50 sunscreen moisturizer 28.35 g 0  . L-LYSINE PO Take 1 tablet by mouth daily.     . metoprolol tartrate (LOPRESSOR) 50 MG tablet Take 1 tablet (50 mg total) by mouth 2 (two) times daily. 60 tablet 11  . metroNIDAZOLE (METROGEL) 0.75 % vaginal gel Place 1 Applicatorful vaginally at bedtime. Apply one applicatorful to vagina at bedtime for 5 days 70 g 1  . Misc. Devices (CANE) MISC 1 each by Does not apply route daily. 1 each 0  . Misc. Devices (HUGO ROLLING WALKER BASIC) MISC 1 each by Does not apply route daily. 1 each 0  . triamcinolone ointment (KENALOG) 0.5 % APPLY 1 APPLICATION TOPICALLY 2 TIMES DAILY. APPLY TO HANDS 30 g 2   No current facility-administered medications  for this visit.      Musculoskeletal: Strength & Muscle Tone: decreased Gait & Station: unsteady, shuffle Patient leans: N/A  Psychiatric Specialty Exam: ROS  Blood pressure 136/86, pulse 90, height 5\' 4"  (1.626 m), weight 167 lb (75.8 kg).Body mass index is 28.67 kg/m.  General Appearance: Casual and Fairly Groomed  Eye Contact:  Fair  Speech:  Clear and Coherent, Normal Rate and wears dentures  Volume:  Normal  Mood:  Dysphoric  Affect:  Congruent  Thought Process:  Goal Directed and Descriptions of Associations: Intact  Orientation:  Full (Time, Place, and Person)  Thought Content: Logical   Suicidal Thoughts:  No  Homicidal Thoughts:  No  Memory:  Immediate;   Fair  Judgement:  Fair  Insight:  Fair  Psychomotor Activity:  Normal  Concentration:  Concentration: Poor  Recall:  [employer] of Knowledge: Fair  Language: Fair  Akathisia:  Negative  Handed:  Right  AIMS (if indicated): not done  Assets:  Communication Skills Desire for Improvement Financial Resources/Insurance Housing  ADL's:  Intact  Cognition: WNL  Sleep:  Poor   Screenings: GAD-7     Office Visit from 04/13/2017 in [facility] Office Visit from 11/28/2016 in [facility] Office Visit from 11/14/2016 in [facility] Office Visit from 09/02/2016 in [facility] Office Visit from 08/15/2016 in [facility]  Total GAD-7 Score  18  18  20  19  20     PHQ2-9     Counselor from 02/10/2018 in [facility] Counselor from 01/26/2018 in [facility] Office Visit from 04/13/2017 in [facility] Office Visit from 11/28/2016 in [facility] Office Visit from 11/14/2016 in [facility]  PHQ-2 Total Score  6  6  6  5  6   PHQ-9  Total Score  16  17  18  17  21        Assessment and Plan:  Meredith Church would like to retry xanax for insomnia given ongoing lingering sedation with elavil.  I reiterated the risks of Xanax including addiction, falls, and deleterious effects including neurocognitive side effects.  She would like to retry 0.25 mg nightly and we agreed to proceed as below.  Will follow-up with Dr. Casimiro Needle in 3 months as scheduled.  1. Other insomnia   2. GAD (generalized anxiety disorder)     Status of current problems: unchanged  Labs Ordered: No orders of the defined types were placed in this encounter.   Labs Reviewed: n/a  Collateral Obtained/Records Reviewed: n/a  Plan:  Elavil discontinued Xanax 0.25 mg BID prn anxiety/sleep Turn to clinic in 12 weeks

## 2018-04-02 ENCOUNTER — Other Ambulatory Visit: Payer: Self-pay

## 2018-04-07 ENCOUNTER — Encounter (HOSPITAL_COMMUNITY): Payer: Self-pay | Admitting: Licensed Clinical Social Worker

## 2018-04-07 ENCOUNTER — Ambulatory Visit (INDEPENDENT_AMBULATORY_CARE_PROVIDER_SITE_OTHER): Payer: Medicaid Other | Admitting: Licensed Clinical Social Worker

## 2018-04-07 DIAGNOSIS — F332 Major depressive disorder, recurrent severe without psychotic features: Secondary | ICD-10-CM

## 2018-04-07 NOTE — Progress Notes (Signed)
   THERAPIST PROGRESS NOTE  Session Time: 3:10-4pm  Participation Level: Active  Behavioral Response: CasualAlertDepressed  Type of Therapy: Individual Therapy  Treatment Goals addressed:  Depressive symptoms  Interventions: CBT and Motivational Interviewing  Summary: Meredith Church is a 62 y.o. female who presents for her individual counseling session. Pt met with her psychiatrist, Dr. Daron Offer, and he prescribed xanax for her to assist with her anxious moments. Pt reports she is taking her medications as prescribed. Pt was tearful today. She has her upcoming disability hearing on 8/1. She is sad that she is "disabled." this is not what she wanted for her life. Role played with pt proceedings of a hearing. Asked open ended questions about what she wants her life to look like now, "her new normal."  Pt was tearful when she described her life before she started having chronic pain. Pt has been referred to a neurosurgeon by her PCP. Pt has been practicing her mindfulness skills at home.          Suicidal/Homicidal: Nowithout intent/plan  Therapist Response: Assessed pt's current functioning and reviewed progress.Assisted pt processing chronic pain, mood changes, medication , new normal, upcoming disability hearing.Assisted pt processing  For the management of her stressors.  Plan: Return again in 2 weeks.    Diagnosis: Axis I: MDD    Jabreel Chimento S, LCAS 04/07/2018

## 2018-04-19 ENCOUNTER — Ambulatory Visit (INDEPENDENT_AMBULATORY_CARE_PROVIDER_SITE_OTHER): Payer: Medicaid Other | Admitting: Licensed Clinical Social Worker

## 2018-04-19 ENCOUNTER — Encounter (HOSPITAL_COMMUNITY): Payer: Self-pay | Admitting: Licensed Clinical Social Worker

## 2018-04-19 DIAGNOSIS — F332 Major depressive disorder, recurrent severe without psychotic features: Secondary | ICD-10-CM

## 2018-04-19 NOTE — Progress Notes (Signed)
   THERAPIST PROGRESS NOTE  Session Time: 2:10-3pm  Participation Level: Active  Behavioral Response: CasualAlertDepressed  Type of Therapy: Individual Therapy  Treatment Goals addressed:  Depressive symptoms  Interventions: CBT and Motivational Interviewing  Summary: Meredith Church is a 62 y.o. female who presents for her individual counseling session. Pt thinks her medications are working well. She still has some depressive moods. Pt had her Disability hearing. She described how anxious she was but using her breathing exercises to help with the anxiety. Her attorney felt the hearing went well, pt reports she couldn't tell how it went. Discussed with pt what happens if she gets her disability, how will her life be different. Asked pt: if you don't get the disability, what will you do? Pt still struggles with the word, "disability." She would rather work. Asked open ended questions. Pt was tearful during discussion.            Suicidal/Homicidal: Nowithout intent/plan  Therapist Response: Assessed pt's current functioning and reviewed progress.Assisted pt processing disability hearing, new normal, work vs disability. Assisted pt processing  For the management of her stressors.  Plan: Return again in 2 weeks.    Diagnosis: Axis I: MDD    Brynlee Pennywell S, LCAS 04/19/2018

## 2018-05-04 ENCOUNTER — Other Ambulatory Visit: Payer: Self-pay | Admitting: Neurosurgery

## 2018-05-04 DIAGNOSIS — M5412 Radiculopathy, cervical region: Secondary | ICD-10-CM

## 2018-05-05 ENCOUNTER — Encounter (HOSPITAL_COMMUNITY): Payer: Self-pay | Admitting: Licensed Clinical Social Worker

## 2018-05-05 ENCOUNTER — Ambulatory Visit (INDEPENDENT_AMBULATORY_CARE_PROVIDER_SITE_OTHER): Payer: Medicaid Other | Admitting: Licensed Clinical Social Worker

## 2018-05-05 DIAGNOSIS — F332 Major depressive disorder, recurrent severe without psychotic features: Secondary | ICD-10-CM

## 2018-05-05 NOTE — Progress Notes (Signed)
   THERAPIST PROGRESS NOTE  Session Time: 2:10-3pm  Participation Level: Active  Behavioral Response: CasualAlertDepressed  Type of Therapy: Individual Therapy  Treatment Goals addressed:  Depressive symptoms  Interventions: CBT  Summary: ANNAKATE SOULIER is a 62 y.o. female who presents for her individual counseling session. Pt thinks her medications are working well. She still has some depressive moods. Pt has spoken to her disability attorney who thinks she will here about her Disability hearing in 2-3 months.This caused her anxiety to elevate because she is having financial issues. Asked open ended questions. Discussed budgeting with pt. She does well with the little $ she has. She will not ask her 2 daughters for $. Pt helps with her mother's care who lives independently in [place]. She takes her 40 year old mother to dr appts. Often. Discussed her feelings surrounding caring for her mother: frustration, guilt.Marland KitchenMarland KitchenPt was open and honest in her sharing of feelings. Pt has seen a doc about her neck but is now seeking another doc after a controversy at the office. Encouraged pt to follow through with dr appt.        Suicidal/Homicidal: Nowithout intent/plan  Therapist Response: Assessed pt's current functioning and reviewed progress.Assisted pt processing disability hearing news, financial issues, dr appt. Assisted pt processing  For the management of her stressors.  Plan: Return again in 2 weeks.    Diagnosis: Axis I: MDD    Davette Nugent S, LCAS 05/05/2018

## 2018-05-19 ENCOUNTER — Ambulatory Visit (HOSPITAL_COMMUNITY): Payer: Self-pay | Admitting: Licensed Clinical Social Worker

## 2018-05-20 ENCOUNTER — Other Ambulatory Visit: Payer: Self-pay

## 2018-05-25 ENCOUNTER — Ambulatory Visit (HOSPITAL_COMMUNITY): Payer: Self-pay | Admitting: Licensed Clinical Social Worker

## 2018-05-27 ENCOUNTER — Ambulatory Visit
Admission: RE | Admit: 2018-05-27 | Discharge: 2018-05-27 | Disposition: A | Discharge status: Home / Self Care | Payer: Medicaid Other | Source: Ambulatory Visit | Attending: Neurosurgery | Admitting: Neurosurgery

## 2018-05-27 DIAGNOSIS — M5412 Radiculopathy, cervical region: Secondary | ICD-10-CM

## 2018-06-02 ENCOUNTER — Ambulatory Visit (INDEPENDENT_AMBULATORY_CARE_PROVIDER_SITE_OTHER): Payer: Medicaid Other | Admitting: Licensed Clinical Social Worker

## 2018-06-02 ENCOUNTER — Encounter (HOSPITAL_COMMUNITY): Payer: Self-pay | Admitting: Licensed Clinical Social Worker

## 2018-06-02 DIAGNOSIS — F332 Major depressive disorder, recurrent severe without psychotic features: Secondary | ICD-10-CM

## 2018-06-02 NOTE — Progress Notes (Signed)
   THERAPIST PROGRESS NOTE  Session Time: 2:10-3:40pm  Participation Level: Active  Behavioral Response: CasualAlertDepressed  Type of Therapy: Individual Therapy  Treatment Goals addressed: Improve Psychiatric Symptoms, elevate mood (increased self-esteem, increased self-compassion, increased interaction), improve unhelpful thought patterns, controlled behavior, moderate mood, deliberate speech and thought process(improved social functioning, healthy adjustment to living situation), Learn about diagnosis, healthy coping skills   Interventions: CBT  Summary: Meredith Church is a 62 y.o. female who presents for her individual counseling session. Pt thinks her medications are working well. She still has some depressive moods. Pt has spoken to her disability attorney and still no results from the hearing. Pt is concerned about the outcome. She still continues to have pain in her shoulder and arm down into her hand. It was determined she has arthritis in her shoulder and nerve pain in her arm extending to her fingers. She is going to drs often to determine tx plans. Pt described her depressive symptoms even though she masks her feelings in session. Pt became tearful and shared she doesn't want to become vulnerable or cry in session. Discussed the importance of feeling emotions. Pt left session early.    Suicidal/Homicidal: Nowithout intent/plan  Therapist Response: Assessed pt's current functioning and reviewed progress.Assisted pt processing disability hearing news, financial issues, dr appt., vulnerability, become tearful in session. Assisted pt processing  For the management of her stressors.  Plan: Return again in 2 weeks.    Diagnosis: Axis I: MDD    Nicle Connole S, LCAS 06/02/2018

## 2018-07-07 ENCOUNTER — Encounter (HOSPITAL_COMMUNITY): Payer: Self-pay | Admitting: Psychiatry

## 2018-07-07 ENCOUNTER — Ambulatory Visit (INDEPENDENT_AMBULATORY_CARE_PROVIDER_SITE_OTHER): Payer: Medicaid Other | Admitting: Psychiatry

## 2018-07-07 VITALS — BP 129/83 | HR 91 | Ht 64.0 in | Wt 176.0 lb

## 2018-07-07 DIAGNOSIS — F411 Generalized anxiety disorder: Secondary | ICD-10-CM

## 2018-07-07 DIAGNOSIS — Z720 Tobacco use: Secondary | ICD-10-CM | POA: Diagnosis not present

## 2018-07-07 DIAGNOSIS — G4709 Other insomnia: Secondary | ICD-10-CM

## 2018-07-07 MED ORDER — DULOXETINE HCL 30 MG PO CPEP: ORAL_CAPSULE | ORAL | 2 refills | Status: DC

## 2018-07-07 MED ORDER — ALPRAZOLAM 0.25 MG PO TABS: 0.2500 mg | ORAL_TABLET | Freq: Two times a day (BID) | ORAL | 1 refills | Status: DC | PRN

## 2018-07-07 NOTE — Progress Notes (Signed)
BH MD/PA/NP OP Progress Note  07/07/2018 4:56 PM Meredith Church  MRN:  [id number]  Chief Complaint: med management follow-up  HPI: Today this 62 year old African-American female was seen in an evaluation for continual psychiatric care.  The patient's diagnoses are insomnia and generalized anxiety disorder.  Today she describes more so persistent depression for 4 to 7 days a week.  Her sleep is chronically poor.  Is mainly interrupted but she still sleeps through the night and not but she does not take naps or if she feels sleepy.  Her weight is stable.  The patient however show significant anhedonia.  She is very distressed by her environment.  She is no problems thinking and concentrating.  He denies being suicidal.  Is never made a suicide attempt.  Presently she is actively in therapy.  Patient lives alone.  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  She is waiting to hear the results.  The patient is retired.  She is to adult children and one grandchild.  Patient describes significant anxiety produces chest discomfort.  But in the close evaluation she denies what is consistent with generalized anxiety disorder.  She does not really have panic disorder.  Patient denies any psychotic symptomatology.  He denies the use of alcohol or drugs.  The patient takes a low-dose of Xanax to help her sleep at night. Visit Diagnosis:    ICD-10-CM   1. Other insomnia G47.09 ALPRAZolam (XANAX) 0.25 MG tablet  2. GAD (generalized anxiety disorder) F41.1 ALPRAZolam (XANAX) 0.25 MG tablet    Past Psychiatric History: See intake H&P for full details. Reviewed, with no updates at this time.   Past Medical History:  Past Medical History:  Diagnosis Date  . Anxiety Dx 2007  . Arthritis   . COPD (chronic obstructive pulmonary disease) ([place])   . Eczema   . Hyperlipidemia Dx 2010  . Hypertension Dx 2010  . Numbness and tingling of lower extremity     Past Surgical History:   Procedure Laterality Date  . ABDOMINAL HYSTERECTOMY    . FRACTURE SURGERY     Left pinky finger  . LIPOMA EXCISION    . LUMBAR LAMINECTOMY/DECOMPRESSION MICRODISCECTOMY N/A 07/03/2016   Procedure: LUMBAR FOUR - LUMBAR FIVE LAMINECTOMY/DISKECTOMY;  Surgeon: [name] [name], MD;  Location: [facility];  Service: Neurosurgery;  Laterality: N/A;  LUMBAR FOUR - LUMBAR FIVE LAMINECTOMY/DISKECTOMY  . SALIVARY GLAND SURGERY    . sciatica     Dr. Sharmaine Base oct. 2017  . TIBIA FRACTURE SURGERY     right  . TOTAL HIP ARTHROPLASTY Right 02/24/2017   Procedure: RIGHT TOTAL HIP ARTHROPLASTY ANTERIOR APPROACH;  Surgeon: Paralee Cancel, MD;  Location: [facility];  Service: Orthopedics;  Laterality: Right;    Family Psychiatric History: See intake H&P for full details. Reviewed, with no updates at this time.   Family History:  Family History  Problem Relation Age of Onset  . Diabetes Mother   . Diabetes Sister   . Cancer Brother        colon cancer     Social History:  Social History   Socioeconomic History  . Marital status: Single    Spouse name: Not on file  . Number of children: Not on file  . Years of education: Not on file  . Highest education level: Not on file  Occupational History  . Not on file  Social Needs  . Financial resource strain: Not on file  . Food  insecurity:    Worry: Not on file    Inability: Not on file  . Transportation needs:    Medical: Not on file    Non-medical: Not on file  Tobacco Use  . Smoking status: Current Some Day Smoker    Packs/day: 0.20    Years: 30.00    Pack years: 6.00    Types: Cigarettes  . Smokeless tobacco: Never Used  . Tobacco comment: Wears Nicotine patch   Substance and Sexual Activity  . Alcohol use: Yes    Alcohol/week: 0.0 standard drinks    Comment: social  . Drug use: No  . Sexual activity: Yes    Partners: Male  Lifestyle  . Physical activity:    Days per week: Not on file    Minutes per session: Not on file  . Stress: Not  on file  Relationships  . Social connections:    Talks on phone: Not on file    Gets together: Not on file    Attends religious service: Not on file    Active member of club or organization: Not on file    Attends meetings of clubs or organizations: Not on file    Relationship status: Not on file  Other Topics Concern  . Not on file  Social History Narrative  . Not on file    Allergies:  Allergies  Allergen Reactions  . Other Other (See Comments)     Nickel "breaks my skin out" Fire ants    Metabolic Disorder Labs: Lab Results  Component Value Date   HGBA1C 5.9 10/03/2013   No results found for: PROLACTIN Lab Results  Component Value Date   CHOL 188 10/03/2013   TRIG 267 (H) 10/03/2013   HDL 42 10/03/2013   CHOLHDL 4.5 10/03/2013   VLDL 53 (H) 10/03/2013   LDLCALC 93 10/03/2013   Lab Results  Component Value Date   TSH 0.936 10/03/2013    Therapeutic Level Labs: No results found for: LITHIUM No results found for: VALPROATE No components found for:  CBMZ  Current Medications: Current Outpatient Medications  Medication Sig Dispense Refill  . albuterol (PROVENTIL HFA;VENTOLIN HFA) 108 (90 Base) MCG/ACT inhaler Inhale 2 puffs into the lungs every 6 (six) hours as needed for wheezing or shortness of breath. 1 Inhaler 6  . ALPRAZolam (XANAX) 0.25 MG tablet Take 1 tablet (0.25 mg total) by mouth 2 (two) times daily as needed for anxiety. 60 tablet 1  . amLODipine (NORVASC) 10 MG tablet Take 1 tablet (10 mg total) by mouth daily. 30 tablet 3  . ammonium lactate (LAC-HYDRIN) 12 % cream Apply topically as needed for dry skin. 385 g 0  . Biotin 1000 MCG tablet Take 1,000 mcg by mouth daily.     . fluconazole (DIFLUCAN) 150 MG tablet Can take additional dose three days later if symptoms persist 2 tablet 0  . hydroquinone 4 % cream Apply topically 2 (two) times daily. To dark areas of face. Use of SPF 30 or 50 sunscreen moisturizer 28.35 g 0  . L-LYSINE PO Take 1 tablet  by mouth daily.     . metoprolol tartrate (LOPRESSOR) 50 MG tablet Take 1 tablet (50 mg total) by mouth 2 (two) times daily. 60 tablet 11  . metroNIDAZOLE (METROGEL) 0.75 % vaginal gel Place 1 Applicatorful vaginally at bedtime. Apply one applicatorful to vagina at bedtime for 5 days 70 g 1  . Misc. Devices (CANE) MISC 1 each by Does not apply route daily. 1  each 0  . Misc. Devices (HUGO ROLLING WALKER BASIC) MISC 1 each by Does not apply route daily. 1 each 0  . triamcinolone ointment (KENALOG) 0.5 % APPLY 1 APPLICATION TOPICALLY 2 TIMES DAILY. APPLY TO HANDS 30 g 2  . DULoxetine (CYMBALTA) 30 MG capsule 1  qam  For  1week  Then 2  qam 60 capsule 2   No current facility-administered medications for this visit.      Musculoskeletal: Strength & Muscle Tone: decreased Gait & Station: unsteady, shuffle Patient leans: N/A  Psychiatric Specialty Exam: ROS  Blood pressure 129/83, pulse 91, height 5\' 4"  (1.626 m), weight 176 lb (79.8 kg), SpO2 94 %.Body mass index is 30.21 kg/m.  General Appearance: Casual and Fairly Groomed  Eye Contact:  Fair  Speech:  Clear and Coherent, Normal Rate and wears dentures  Volume:  Normal  Mood:  Dysphoric  Affect:  Congruent  Thought Process:  Goal Directed and Descriptions of Associations: Intact  Orientation:  Full (Time, Place, and Person)  Thought Content: Logical   Suicidal Thoughts:  No  Homicidal Thoughts:  No  Memory:  Immediate;   Fair  Judgement:  Fair  Insight:  Fair  Psychomotor Activity:  Normal  Concentration:  Concentration: Poor  Recall:  [employer] of Knowledge: Fair  Language: Fair  Akathisia:  Negative  Handed:  Right  AIMS (if indicated): not done  Assets:  Communication Skills Desire for Improvement Financial Resources/Insurance Housing  ADL's:  Intact  Cognition: WNL  Sleep:  Poor   Screenings: GAD-7     Office Visit from 04/13/2017 in [facility] Office Visit from 11/28/2016 in [facility] Office Visit from 11/14/2016 in [facility] Office Visit from 09/02/2016 in [facility] Office Visit from 08/15/2016 in [facility]  Total GAD-7 Score  18  18  20  19  20     PHQ2-9     Counselor from 02/10/2018 in [facility] Counselor from 01/26/2018 in [facility] Office Visit from 04/13/2017 in [facility] Office Visit from 11/28/2016 in [facility] Office Visit from 11/14/2016 in [facility]  PHQ-2 Total Score  6  6  6  5  6   PHQ-9 Total Score  16  17  18  17  21        Assessment and Plan:  At this time I believe the patient's major problem seems to be a depression disorder.  In this short and evaluation we could not clearly define major depression but it is very evident that she would benefit by being on a medication for anxiety.  Her previous psychiatrist thought she had generalized anxiety disorder.  At this time my prescription will be to begin on Cymbalta and build up to 60 mg.  Her first problem is that of situational anxiety.  It is my hope that Cymbalta helps that by treating anxiety but also helps with her pain.  Pain is a significant problem with this patient.  She will continue taking low-dose Xanax 0.25 mg every night.  This is a focused evaluation.  I do not think this patient is suicidal.  She is functioning fairly well.  She will return to see me in approximately 7 weeks.  She shows no evidence of psychosis.  She certainly is  not suicidal.  1. Other insomnia   2. GAD (generalized anxiety disorder)     Status of current problems: unchanged  Labs Ordered: No orders of the defined types were placed in this encounter.   Labs Reviewed: n/a  Collateral Obtained/Records Reviewed: n/a  Plan:  Elavil  discontinued Xanax 0.25 mg BID prn anxiety/sleep Turn to clinic in 12 weeks

## 2018-09-24 ENCOUNTER — Ambulatory Visit (INDEPENDENT_AMBULATORY_CARE_PROVIDER_SITE_OTHER): Payer: Medicare Other | Admitting: Psychiatry

## 2018-09-24 ENCOUNTER — Encounter (HOSPITAL_COMMUNITY): Payer: Self-pay | Admitting: Psychiatry

## 2018-09-24 VITALS — BP 142/80 | Ht 64.0 in | Wt 177.0 lb

## 2018-09-24 DIAGNOSIS — F329 Major depressive disorder, single episode, unspecified: Secondary | ICD-10-CM

## 2018-09-24 DIAGNOSIS — G4709 Other insomnia: Secondary | ICD-10-CM

## 2018-09-24 DIAGNOSIS — F411 Generalized anxiety disorder: Secondary | ICD-10-CM

## 2018-09-24 MED ORDER — DULOXETINE HCL 60 MG PO CPEP: ORAL_CAPSULE | ORAL | 4 refills | Status: DC

## 2018-09-24 MED ORDER — ALPRAZOLAM 0.25 MG PO TABS: 0.2500 mg | ORAL_TABLET | Freq: Two times a day (BID) | ORAL | 4 refills | Status: DC | PRN

## 2018-09-24 NOTE — Progress Notes (Signed)
[facility] MD/PA/NP OP Progress Note  09/24/2018 11:55 AM Meredith Church  MRN:  [id number]  Chief Complaint: Depression  HPI: This patient is being seen in this office for major depression.  At first it was not evident that she had persistent depressed mood state.  But actually she had about half a week but more importantly she demonstrated persistent anhedonia.  Her sleep was mildly afflicted.  This is interrupted but she does not have any daytime dysfunction.  Her appetite is good her energy level is stable.  She has no problems thinking or concentrating and she has a good sense of work.  She is not suicidal.  Patient enjoys shopping.  She also does a lot of hobbies.  In essence her anhedonia is less.  Since being placed on 60 mg of Cymbalta she claims she is better.  Her mood is improved she is less anhedonic.  Unfortunately she has had no benefits in terms of her pain.  The patient lives alone.  She is single she is in no relationship at this time.  Particulars are doing well her grandchild who is 31 years old is doing very well.  The patient enjoys her Christmas.  The patient denies use of alcohol or drugs.  Medically she is very stable.  She denies chest pain or shortness of breath.  Patient had multiple orthopedic surgeries in the past.  She has chronic moderate pain.  She clearly believes Cymbalta has helped her mood state.  She is enjoying more life.    ICD-10-CM   1. Other insomnia G47.09 ALPRAZolam (XANAX) 0.25 MG tablet  2. GAD (generalized anxiety disorder) F41.1 ALPRAZolam (XANAX) 0.25 MG tablet    Past Psychiatric History: See intake H&P for full details. Reviewed, with no updates at this time.   Past Medical History:  Past Medical History:  Diagnosis Date  . Anxiety Dx 2007  . Arthritis   . COPD (chronic obstructive pulmonary disease) ([place])   . Eczema   . Hyperlipidemia Dx 2010  . Hypertension Dx 2010  . Numbness and tingling of lower extremity     Past Surgical History:   Procedure Laterality Date  . ABDOMINAL HYSTERECTOMY    . FRACTURE SURGERY     Left pinky finger  . LIPOMA EXCISION    . LUMBAR LAMINECTOMY/DECOMPRESSION MICRODISCECTOMY N/A 07/03/2016   Procedure: LUMBAR FOUR - LUMBAR FIVE LAMINECTOMY/DISKECTOMY;  Surgeon: [name] [name], MD;  Location: [facility];  Service: Neurosurgery;  Laterality: N/A;  LUMBAR FOUR - LUMBAR FIVE LAMINECTOMY/DISKECTOMY  . SALIVARY GLAND SURGERY    . sciatica     Dr. Sharmaine Base oct. 2017  . TIBIA FRACTURE SURGERY     right  . TOTAL HIP ARTHROPLASTY Right 02/24/2017   Procedure: RIGHT TOTAL HIP ARTHROPLASTY ANTERIOR APPROACH;  Surgeon: Paralee Cancel, MD;  Location[facility];  Service: Orthopedics;  Laterality: Right;    Family Psychiatric History: See intake H&P for full details. Reviewed, with no updates at this time.   Family History:  Family History  Problem Relation Age of Onset  . Diabetes Mother   . Diabetes Sister   . Cancer Brother        colon cancer     Social History:  Social History   Socioeconomic History  . Marital status: Single    Spouse name: Not on file  . Number of children: Not on file  . Years of education: Not on file  . Highest education level: Not on file  Occupational History  .  Not on file  Social Needs  . Financial resource strain: Not on file  . Food insecurity:    Worry: Not on file    Inability: Not on file  . Transportation needs:    Medical: Not on file    Non-medical: Not on file  Tobacco Use  . Smoking status: Current Some Day Smoker    Packs/day: 0.20    Years: 30.00    Pack years: 6.00    Types: Cigarettes  . Smokeless tobacco: Never Used  . Tobacco comment: Wears Nicotine patch   Substance and Sexual Activity  . Alcohol use: Yes    Alcohol/week: 0.0 standard drinks    Comment: social  . Drug use: No  . Sexual activity: Yes    Partners: Male  Lifestyle  . Physical activity:    Days per week: Not on file    Minutes per session: Not on file  . Stress: Not  on file  Relationships  . Social connections:    Talks on phone: Not on file    Gets together: Not on file    Attends religious service: Not on file    Active member of club or organization: Not on file    Attends meetings of clubs or organizations: Not on file    Relationship status: Not on file  Other Topics Concern  . Not on file  Social History Narrative  . Not on file    Allergies:  Allergies  Allergen Reactions  . Other Other (See Comments)     Nickel "breaks my skin out" Fire ants    Metabolic Disorder Labs: Lab Results  Component Value Date   HGBA1C 5.9 10/03/2013   No results found for: PROLACTIN Lab Results  Component Value Date   CHOL 188 10/03/2013   TRIG 267 (H) 10/03/2013   HDL 42 10/03/2013   CHOLHDL 4.5 10/03/2013   VLDL 53 (H) 10/03/2013   LDLCALC 93 10/03/2013   Lab Results  Component Value Date   TSH 0.936 10/03/2013    Therapeutic Level Labs: No results found for: LITHIUM No results found for: VALPROATE No components found for:  CBMZ  Current Medications: Current Outpatient Medications  Medication Sig Dispense Refill  . albuterol (PROVENTIL HFA;VENTOLIN HFA) 108 (90 Base) MCG/ACT inhaler Inhale 2 puffs into the lungs every 6 (six) hours as needed for wheezing or shortness of breath. 1 Inhaler 6  . ALPRAZolam (XANAX) 0.25 MG tablet Take 1 tablet (0.25 mg total) by mouth 2 (two) times daily as needed for anxiety. 60 tablet 4  . amLODipine (NORVASC) 10 MG tablet Take 1 tablet (10 mg total) by mouth daily. 30 tablet 3  . ammonium lactate (LAC-HYDRIN) 12 % cream Apply topically as needed for dry skin. 385 g 0  . Biotin 1000 MCG tablet Take 1,000 mcg by mouth daily.     . DULoxetine (CYMBALTA) 60 MG capsule 2  qam 60 capsule 4  . fluconazole (DIFLUCAN) 150 MG tablet Can take additional dose three days later if symptoms persist 2 tablet 0  . hydroquinone 4 % cream Apply topically 2 (two) times daily. To dark areas of face. Use of SPF 30 or 50  sunscreen moisturizer 28.35 g 0  . L-LYSINE PO Take 1 tablet by mouth daily.     . metoprolol tartrate (LOPRESSOR) 50 MG tablet Take 1 tablet (50 mg total) by mouth 2 (two) times daily. 60 tablet 11  . metroNIDAZOLE (METROGEL) 0.75 % vaginal gel Place 1 Applicatorful vaginally  at bedtime. Apply one applicatorful to vagina at bedtime for 5 days 70 g 1  . Misc. Devices (CANE) MISC 1 each by Does not apply route daily. 1 each 0  . Misc. Devices (HUGO ROLLING WALKER BASIC) MISC 1 each by Does not apply route daily. 1 each 0  . triamcinolone ointment (KENALOG) 0.5 % APPLY 1 APPLICATION TOPICALLY 2 TIMES DAILY. APPLY TO HANDS 30 g 2   No current facility-administered medications for this visit.      Musculoskeletal: Strength & Muscle Tone: decreased Gait & Station: unsteady, shuffle Patient leans: N/A  Psychiatric Specialty Exam: ROS  Blood pressure (!) 142/80, height 5\' 4"  (1.626 m), weight 177 lb (80.3 kg).Body mass index is 30.38 kg/m.  General Appearance: Casual and Fairly Groomed  Eye Contact:  Fair  Speech:  Clear and Coherent, Normal Rate and wears dentures  Volume:  Normal  Mood:  Dysphoric  Affect:  Congruent  Thought Process:  Goal Directed and Descriptions of Associations: Intact  Orientation:  Full (Time, Place, and Person)  Thought Content: Logical   Suicidal Thoughts:  No  Homicidal Thoughts:  No  Memory:  Immediate;   Fair  Judgement:  Fair  Insight:  Fair  Psychomotor Activity:  Normal  Concentration:  Concentration: Poor  Recall:  [employer] of Knowledge: Fair  Language: Fair  Akathisia:  Negative  Handed:  Right  AIMS (if indicated): not done  Assets:  Communication Skills Desire for Improvement Financial Resources/Insurance Housing  ADL's:  Intact  Cognition: WNL  Sleep:  Poor   Screenings: GAD-7     Office Visit from 04/13/2017 in [facility] Office Visit from 11/28/2016 in [facility]  Visit from 11/14/2016 in [facility] Office Visit from 09/02/2016 in [facility] Office Visit from 08/15/2016 in [facility]  Total GAD-7 Score  18  18  20  19  20     PHQ2-9     Counselor from 02/10/2018 in [facility] Counselor from 01/26/2018 in [facility] Office Visit from 04/13/2017 in [facility] Office Visit from 11/28/2016 in [facility] Office Visit from 11/14/2016 in [facility]  PHQ-2 Total Score  6  6  6  5  6   PHQ-9 Total Score  16  17  18  17  21        Assessment and Plan:  This patient has 1 major problem.  Seems to be major depression.  On her last visit we started her on Cymbalta and she seems to have a response.  Unfortunately that has not helped her pain.  Today we will go ahead and increase it to the maximum dose of 120 mg.  The patient continue taking Xanax 0.25 mg 2 at night.  Patient is not suicidal.  Her energy level is good.  She denies fatigue.  She is actually functioning pretty well.  I would say her risk related to morbidity mentality is low in terms of mortality but moderate in terms of morbidity mainly related to her pain.  Her clinical depression is improved.  I believe reducing her pain will be beneficial to her mood state.  Therefore my intervention today is to increase her Cymbalta to the maximum dose which may reduce her pain which hopefully will also reduce the state  of depression.  Although as noted her depression is improved already.  The patient return to see me in approximately 3 or 4 months for short visit.  1. Other insomnia   2. GAD (generalized anxiety disorder)     Status of current problems: unchanged  Labs Ordered: No orders of the defined types were placed in this encounter.   Labs Reviewed:  n/a  Collateral Obtained/Records Reviewed: n/a  Plan:  Elavil discontinued Xanax 0.25 mg BID prn anxiety/sleep Turn to clinic in 12 weeks

## 2018-11-13 ENCOUNTER — Other Ambulatory Visit: Payer: Self-pay | Admitting: Family Medicine

## 2018-11-13 DIAGNOSIS — I10 Essential (primary) hypertension: Secondary | ICD-10-CM

## 2018-12-09 ENCOUNTER — Other Ambulatory Visit: Payer: Self-pay | Admitting: Family Medicine

## 2018-12-09 DIAGNOSIS — I10 Essential (primary) hypertension: Secondary | ICD-10-CM

## 2018-12-14 ENCOUNTER — Other Ambulatory Visit: Payer: Self-pay | Admitting: Family Medicine

## 2018-12-14 DIAGNOSIS — I10 Essential (primary) hypertension: Secondary | ICD-10-CM

## 2019-01-28 ENCOUNTER — Other Ambulatory Visit: Payer: Self-pay

## 2019-01-28 ENCOUNTER — Ambulatory Visit (INDEPENDENT_AMBULATORY_CARE_PROVIDER_SITE_OTHER): Payer: Medicare Other | Admitting: Psychiatry

## 2019-01-28 ENCOUNTER — Encounter (HOSPITAL_COMMUNITY): Payer: Self-pay | Admitting: Psychiatry

## 2019-01-28 DIAGNOSIS — F324 Major depressive disorder, single episode, in partial remission: Secondary | ICD-10-CM | POA: Diagnosis not present

## 2019-01-28 NOTE — Progress Notes (Signed)
[facility] MD/PA/NP OP Progress Note  01/28/2019 11:13 AM KENZINGTON MIELKE  MRN:  [id number]  Chief Complaint: Depression  HPI: Today the patient is doing fairly well.  She is actually been 1 medicine that of OxyContin for back and hip pain.  She says her pain level is pretty high but in reality I think she is over exaggerated.  Patient says she was having chest pain and she herself decided to stop is her OxyContin.  She claims that when she stopped it her chest pain went away.  This is of course and consistent with what we might think.  Nonetheless since she saw me we increased her Cymbalta to the maximum dose of 120 mg and she and I both believe that is sufficient for pain control.  She says it is very helpful.  She still takes a small dose of Xanax at night but together with the Cymbalta she is doing well.  She is sleeping and eating well.  She is positive and optimistic.  She is looking forward for the virus to go away.  She does isolate herself without a problem.  She demonstrates no chest pain or shortness of breath at this time.  She has no symptoms of her coronavirus.  She does not have a fever she is not coughing and generally she is getting better and better.  No diagnosis found.  Past Psychiatric History: See intake H&P for full details. Reviewed, with no updates at this time.   Past Medical History:  Past Medical History:  Diagnosis Date  . Anxiety Dx 2007  . Arthritis   . COPD (chronic obstructive pulmonary disease) ([place])   . Eczema   . Hyperlipidemia Dx 2010  . Hypertension Dx 2010  . Numbness and tingling of lower extremity     Past Surgical History:  Procedure Laterality Date  . ABDOMINAL HYSTERECTOMY    . FRACTURE SURGERY     Left pinky finger  . LIPOMA EXCISION    . LUMBAR LAMINECTOMY/DECOMPRESSION MICRODISCECTOMY N/A 07/03/2016   Procedure: LUMBAR FOUR - LUMBAR FIVE LAMINECTOMY/DISKECTOMY;  Surgeon: [name] [name], MD;  Location: [facility];  Service: Neurosurgery;   Laterality: N/A;  LUMBAR FOUR - LUMBAR FIVE LAMINECTOMY/DISKECTOMY  . SALIVARY GLAND SURGERY    . sciatica     Dr. Sharmaine Base oct. 2017  . TIBIA FRACTURE SURGERY     right  . TOTAL HIP ARTHROPLASTY Right 02/24/2017   Procedure: RIGHT TOTAL HIP ARTHROPLASTY ANTERIOR APPROACH;  Surgeon: Paralee Cancel, MD;  Location: [facility];  Service: Orthopedics;  Laterality: Right;    Family Psychiatric History: See intake H&P for full details. Reviewed, with no updates at this time.   Family History:  Family History  Problem Relation Age of Onset  . Diabetes Mother   . Diabetes Sister   . Cancer Brother        colon cancer     Social History:  Social History   Socioeconomic History  . Marital status: Single    Spouse name: Not on file  . Number of children: Not on file  . Years of education: Not on file  . Highest education level: Not on file  Occupational History  . Not on file  Social Needs  . Financial resource strain: Not on file  . Food insecurity:    Worry: Not on file    Inability: Not on file  . Transportation needs:    Medical: Not on file    Non-medical: Not on file  Tobacco  Use  . Smoking status: Current Some Day Smoker    Packs/day: 0.20    Years: 30.00    Pack years: 6.00    Types: Cigarettes  . Smokeless tobacco: Never Used  . Tobacco comment: Wears Nicotine patch   Substance and Sexual Activity  . Alcohol use: Yes    Alcohol/week: 0.0 standard drinks    Comment: social  . Drug use: No  . Sexual activity: Yes    Partners: Male  Lifestyle  . Physical activity:    Days per week: Not on file    Minutes per session: Not on file  . Stress: Not on file  Relationships  . Social connections:    Talks on phone: Not on file    Gets together: Not on file    Attends religious service: Not on file    Active member of club or organization: Not on file    Attends meetings of clubs or organizations: Not on file    Relationship status: Not on file  Other Topics Concern  .  Not on file  Social History Narrative  . Not on file    Allergies:  Allergies  Allergen Reactions  . Other Other (See Comments)     Nickel "breaks my skin out" Fire ants    Metabolic Disorder Labs: Lab Results  Component Value Date   HGBA1C 5.9 10/03/2013   No results found for: PROLACTIN Lab Results  Component Value Date   CHOL 188 10/03/2013   TRIG 267 (H) 10/03/2013   HDL 42 10/03/2013   CHOLHDL 4.5 10/03/2013   VLDL 53 (H) 10/03/2013   LDLCALC 93 10/03/2013   Lab Results  Component Value Date   TSH 0.936 10/03/2013    Therapeutic Level Labs: No results found for: LITHIUM No results found for: VALPROATE No components found for:  CBMZ  Current Medications: Current Outpatient Medications  Medication Sig Dispense Refill  . albuterol (PROVENTIL HFA;VENTOLIN HFA) 108 (90 Base) MCG/ACT inhaler Inhale 2 puffs into the lungs every 6 (six) hours as needed for wheezing or shortness of breath. 1 Inhaler 6  . ALPRAZolam (XANAX) 0.25 MG tablet Take 1 tablet (0.25 mg total) by mouth 2 (two) times daily as needed for anxiety. 60 tablet 4  . amLODipine (NORVASC) 10 MG tablet Take 1 tablet (10 mg total) by mouth daily. 30 tablet 3  . ammonium lactate (LAC-HYDRIN) 12 % cream Apply topically as needed for dry skin. 385 g 0  . Biotin 1000 MCG tablet Take 1,000 mcg by mouth daily.     . DULoxetine (CYMBALTA) 60 MG capsule 2  qam 60 capsule 4  . fluconazole (DIFLUCAN) 150 MG tablet Can take additional dose three days later if symptoms persist 2 tablet 0  . hydroquinone 4 % cream Apply topically 2 (two) times daily. To dark areas of face. Use of SPF 30 or 50 sunscreen moisturizer 28.35 g 0  . L-LYSINE PO Take 1 tablet by mouth daily.     . metoprolol tartrate (LOPRESSOR) 50 MG tablet Take 1 tablet (50 mg total) by mouth 2 (two) times daily. 60 tablet 11  . metroNIDAZOLE (METROGEL) 0.75 % vaginal gel Place 1 Applicatorful vaginally at bedtime. Apply one applicatorful to vagina at  bedtime for 5 days 70 g 1  . Misc. Devices (CANE) MISC 1 each by Does not apply route daily. 1 each 0  . Misc. Devices (HUGO ROLLING WALKER BASIC) MISC 1 each by Does not apply route daily. 1 each 0  .  triamcinolone ointment (KENALOG) 0.5 % APPLY 1 APPLICATION TOPICALLY 2 TIMES DAILY. APPLY TO HANDS 30 g 2   No current facility-administered medications for this visit.      Musculoskeletal: Strength & Muscle Tone: decreased Gait & Station: unsteady, shuffle Patient leans: N/A  Psychiatric Specialty Exam: ROS  There were no vitals taken for this visit.There is no height or weight on file to calculate BMI.  General Appearance: Casual and Fairly Groomed  Eye Contact:  Fair  Speech:  Clear and Coherent, Normal Rate and wears dentures  Volume:  Normal  Mood:  Dysphoric  Affect:  Congruent  Thought Process:  Goal Directed and Descriptions of Associations: Intact  Orientation:  Full (Time, Place, and Person)  Thought Content: Logical   Suicidal Thoughts:  No  Homicidal Thoughts:  No  Memory:  Immediate;   Fair  Judgement:  Fair  Insight:  Fair  Psychomotor Activity:  Normal  Concentration:  Concentration: Poor  Recall:  [employer] of Knowledge: Fair  Language: Fair  Akathisia:  Negative  Handed:  Right  AIMS (if indicated): not done  Assets:  Communication Skills Desire for Improvement Financial Resources/Insurance Housing  ADL's:  Intact  Cognition: WNL  Sleep:  Poor   Screenings: GAD-7     Office Visit from 04/13/2017 in [facility] Office Visit from 11/28/2016 in [facility] Office Visit from 11/14/2016 in [facility] Office Visit from 09/02/2016 in [facility] Office Visit from 08/15/2016 in [facility]  Total GAD-7 Score  18  18  20  19  20     PHQ2-9     Counselor from 02/10/2018 in [facility] Counselor from 01/26/2018 in [facility] Office Visit from 04/13/2017 in [facility] Office Visit from 11/28/2016 in [facility] Office Visit from 11/14/2016 in [facility]  PHQ-2 Total Score  6  6  6  5  6   PHQ-9 Total Score  16  17  18  17  21        Assessment and Plan:   This patient's problems is that of major depression.  She is taking Cymbalta 120 mg which works very well for her.  She takes a small dose of Xanax 0.25 mg 2 at night to treat her insomnia.  So in essence she has major depression and well-controlled insomnia.  I think the patient is very stable.  She certainly is not suicidal.  She has no physical complaints at all.  She will return to see me in 3 months. No diagnosis found.  Status of current problems: unchanged  Labs Ordered: No orders of the defined types were placed in this encounter.   Labs Reviewed: n/a  Collateral Obtained/Records Reviewed: n/a  Plan:  Elavil discontinued Xanax 0.25 mg BID prn anxiety/sleep Turn to clinic in 12 weeks

## 2019-02-01 ENCOUNTER — Encounter: Payer: Self-pay | Admitting: General Surgery

## 2019-02-02 ENCOUNTER — Other Ambulatory Visit: Payer: Self-pay | Admitting: Family Medicine

## 2019-02-02 ENCOUNTER — Other Ambulatory Visit: Payer: Self-pay

## 2019-02-02 ENCOUNTER — Ambulatory Visit (INDEPENDENT_AMBULATORY_CARE_PROVIDER_SITE_OTHER): Payer: Medicare Other | Admitting: Gastroenterology

## 2019-02-02 ENCOUNTER — Encounter: Payer: Self-pay | Admitting: Gastroenterology

## 2019-02-02 VITALS — Ht 64.0 in | Wt 170.0 lb

## 2019-02-02 DIAGNOSIS — R1310 Dysphagia, unspecified: Secondary | ICD-10-CM | POA: Diagnosis not present

## 2019-02-02 DIAGNOSIS — R079 Chest pain, unspecified: Secondary | ICD-10-CM

## 2019-02-02 DIAGNOSIS — I10 Essential (primary) hypertension: Secondary | ICD-10-CM

## 2019-02-02 NOTE — Progress Notes (Signed)
Review of pertinent gastrointestinal problems: 1. Routine risk for colon cancer.  Colonoscopy Dr. Ardis Hughs March 2015 for routine risk screening removed 2 polyps, neither were precancerous and she was recommended to have repeat colonoscopy at 10 years.  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   Only audio was used.  The patient was located at home.  I was located in my office.  The patient did consent to this virtual visit and is aware of possible charges through their insurance for this visit.  I saw her about 5 years ago the time of a colonoscopy, not since then.  She is considered a new patient.  My certified medical assistant, Grace Bushy, contributed to this visit by contacting the patient by phone 1 or 2 business days prior to the appointment and also followed up on the recommendations I made after the visit.  Time spent on virtual visit: 23 min   HPI: This is a very pleasant 63 year old woman whom I last saw the time of a colonoscopy about 5 years ago.  See those results summarized above  Starting a few months ago she began having odynophagia and dysphagia.  Heartburn started around the same time.  She modified her diet so that she would only eat soft foods.  She still has that modified diet.  She has not been losing any weight.  Her primary care physician put her on pantoprazole twice a day.  She was also having some minor heartburn symptoms around the same time.  The pantoprazole helped with her heartburn.  Interestingly she has been on oxycodone 5 mg for many many months, even before these upper GI issues started.  She was taking it for some orthopedic issues and hip pain surgery.  She decided to stop taking the oxycodone earlier this month and her chest pains and dysphasia improved but not completely.   Chief complaint is dysphasia, chest pains (after eating), mild heartburn  ROS: complete GI ROS as described in  HPI, all other review negative.  Constitutional:  No unintentional weight loss   Past Medical History:  Diagnosis Date  . Anxiety Dx 2007  . Arthritis   . COPD (chronic obstructive pulmonary disease) ([place])   . Eczema   . Hyperlipidemia Dx 2010  . Hypertension Dx 2010  . Numbness and tingling of lower extremity     Past Surgical History:  Procedure Laterality Date  . ABDOMINAL HYSTERECTOMY    . FRACTURE SURGERY     Left pinky finger  . LIPOMA EXCISION    . LUMBAR LAMINECTOMY/DECOMPRESSION MICRODISCECTOMY N/A 07/03/2016   Procedure: LUMBAR FOUR - LUMBAR FIVE LAMINECTOMY/DISKECTOMY;  Surgeon: [name] [name], MD;  Location: [facility];  Service: Neurosurgery;  Laterality: N/A;  LUMBAR FOUR - LUMBAR FIVE LAMINECTOMY/DISKECTOMY  . SALIVARY GLAND SURGERY    . sciatica     Dr. Sharmaine Base oct. 2017  . TIBIA FRACTURE SURGERY     right  . TOTAL HIP ARTHROPLASTY Right 02/24/2017   Procedure: RIGHT TOTAL HIP ARTHROPLASTY ANTERIOR APPROACH;  Surgeon: Paralee Cancel, MD;  Location[facility];  Service: Orthopedics;  Laterality: Right;    Current Outpatient Medications  Medication Sig Dispense Refill  . albuterol (PROVENTIL HFA;VENTOLIN HFA) 108 (90 Base) MCG/ACT inhaler Inhale 2 puffs into the lungs every 6 (six) hours as needed for wheezing or shortness of breath. 1 Inhaler 6  . ALPRAZolam (XANAX) 0.25 MG tablet Take 1 tablet (0.25 mg  total) by mouth 2 (two) times daily as needed for anxiety. 60 tablet 4  . amLODipine (NORVASC) 10 MG tablet Take 1 tablet (10 mg total) by mouth daily. 30 tablet 3  . ammonium lactate (LAC-HYDRIN) 12 % cream Apply topically as needed for dry skin. 385 g 0  . Biotin 1000 MCG tablet Take 1,000 mcg by mouth daily.     . DULoxetine (CYMBALTA) 60 MG capsule 2  qam 60 capsule 4  . fluconazole (DIFLUCAN) 150 MG tablet Can take additional dose three days later if symptoms persist 2 tablet 0  . hydroquinone 4 % cream Apply topically 2 (two) times daily. To dark areas of face.  Use of SPF 30 or 50 sunscreen moisturizer 28.35 g 0  . L-LYSINE PO Take 1 tablet by mouth daily.     . metoprolol tartrate (LOPRESSOR) 50 MG tablet Take 1 tablet (50 mg total) by mouth 2 (two) times daily. 60 tablet 11  . metroNIDAZOLE (METROGEL) 0.75 % vaginal gel Place 1 Applicatorful vaginally at bedtime. Apply one applicatorful to vagina at bedtime for 5 days 70 g 1  . Misc. Devices (CANE) MISC 1 each by Does not apply route daily. 1 each 0  . Misc. Devices (HUGO ROLLING WALKER BASIC) MISC 1 each by Does not apply route daily. 1 each 0  . triamcinolone ointment (KENALOG) 0.5 % APPLY 1 APPLICATION TOPICALLY 2 TIMES DAILY. APPLY TO HANDS 30 g 2   No current facility-administered medications for this visit.     Allergies as of 02/02/2019 - Review Complete 02/02/2019  Allergen Reaction Noted  . Other Other (See Comments) 07/02/2016    Family History  Problem Relation Age of Onset  . Diabetes Mother   . Diabetes Sister   . Cancer Brother        colon cancer     Social History   Socioeconomic History  . Marital status: Single    Spouse name: Not on file  . Number of children: Not on file  . Years of education: Not on file  . Highest education level: Not on file  Occupational History  . Not on file  Social Needs  . Financial resource strain: Not on file  . Food insecurity:    Worry: Not on file    Inability: Not on file  . Transportation needs:    Medical: Not on file    Non-medical: Not on file  Tobacco Use  . Smoking status: Current Some Day Smoker    Packs/day: 0.20    Years: 30.00    Pack years: 6.00    Types: Cigarettes  . Smokeless tobacco: Never Used  . Tobacco comment: Wears Nicotine patch   Substance and Sexual Activity  . Alcohol use: Yes    Alcohol/week: 0.0 standard drinks    Comment: social  . Drug use: No  . Sexual activity: Yes    Partners: Male  Lifestyle  . Physical activity:    Days per week: Not on file    Minutes per session: Not on file   . Stress: Not on file  Relationships  . Social connections:    Talks on phone: Not on file    Gets together: Not on file    Attends religious service: Not on file    Active member of club or organization: Not on file    Attends meetings of clubs or organizations: Not on file    Relationship status: Not on file  . Intimate partner violence:  Fear of current or ex partner: Not on file    Emotionally abused: Not on file    Physically abused: Not on file    Forced sexual activity: Not on file  Other Topics Concern  . Not on file  Social History Narrative  . Not on file     Physical Exam: Unable to perform because this was a "telemed visit" due to current Covid-19 pandemic  Assessment and plan: 63 y.o. female with chest pains, mild heartburn, dysphasia  I think a lot of her symptoms are probably acid related.  I did recommend that she decrease her pantoprazole to once daily since many of her symptoms have improved.  She is still bothered by some minor dysphasia and she has still had her diet modified to only soft foods.  I recommend an upper endoscopy at her soonest convenience to evaluate for significant GERD related damage such as stricturing, severe esophagitis.  Less likely neoplasia given her weight stability  Please see the "Patient Instructions" section for addition details about the plan.  Owens Loffler, MD [facility] 02/02/2019, 1:15 PM

## 2019-02-02 NOTE — Patient Instructions (Addendum)
She knows to decrease her pantoprazole so that she is only taking 40 mg pill once a day instead of twice a day for now.   We will arrange for upper endoscopy at her soonest convenience to evaluate her chest pain,  odynophasia, dysphagia.  Thank you for entrusting me with your care and choosing [facility].  Dr Ardis Hughs

## 2019-02-04 ENCOUNTER — Telehealth: Payer: Self-pay | Admitting: Gastroenterology

## 2019-02-04 NOTE — Telephone Encounter (Signed)
Left message on machine to call back  

## 2019-02-04 NOTE — Telephone Encounter (Signed)
Pt is scheduled for an EGD--she reported that Dr, Ardis Hughs decreased dose of pantoprazole from BID to once d.  She reported that she is experiencing severe GERD pain.  Please advise.

## 2019-02-04 NOTE — Telephone Encounter (Signed)
The pt was advised that she can go back to PPI BID until her EGD on 6/2.  The pt has been advised of the information and verbalized understanding.

## 2019-02-13 ENCOUNTER — Telehealth: Payer: Self-pay | Admitting: *Deleted

## 2019-02-13 NOTE — Telephone Encounter (Signed)
Covid-19 screening questions  Have you traveled in the last 14 days? No. If yes where?  Do you now or have you had a fever in the last 14 days? No.  Do you have any respiratory symptoms of shortness of breath or cough now or in the last 14 days? No.  Do you have any family members or close contacts with diagnosed or suspected Covid-19 in the past 14 days? No.  Have you been tested for Covid-19 and found to be positive? No.       

## 2019-02-15 ENCOUNTER — Encounter: Payer: Self-pay | Admitting: Gastroenterology

## 2019-02-15 ENCOUNTER — Ambulatory Visit (AMBULATORY_SURGERY_CENTER): Payer: Medicare Other | Admitting: Gastroenterology

## 2019-02-15 ENCOUNTER — Other Ambulatory Visit: Payer: Self-pay

## 2019-02-15 VITALS — BP 146/89 | HR 71 | Temp 99.2°F | Resp 27 | Ht 64.0 in | Wt 170.0 lb

## 2019-02-15 DIAGNOSIS — K3189 Other diseases of stomach and duodenum: Secondary | ICD-10-CM

## 2019-02-15 DIAGNOSIS — K2970 Gastritis, unspecified, without bleeding: Secondary | ICD-10-CM

## 2019-02-15 DIAGNOSIS — R1310 Dysphagia, unspecified: Secondary | ICD-10-CM | POA: Diagnosis not present

## 2019-02-15 MED ORDER — SODIUM CHLORIDE 0.9 % IV SOLN: 500.0000 mL | Freq: Once | INTRAVENOUS | Status: DC

## 2019-02-15 NOTE — Progress Notes (Signed)
Pt's states no medical or surgical changes since previsit or office visit. 

## 2019-02-15 NOTE — Patient Instructions (Signed)
Handout given for Gastritis.  You should take your Protonix 20-30 minutes before breakfast and dinner meals.  YOU HAD AN ENDOSCOPIC PROCEDURE TODAY AT [facility]:   Refer to the procedure report that was given to you for any specific questions about what was found during the examination.  If the procedure report does not answer your questions, please call your gastroenterologist to clarify.  If you requested that your care partner not be given the details of your procedure findings, then the procedure report has been included in a sealed envelope for you to review at your convenience later.  YOU SHOULD EXPECT: Some feelings of bloating in the abdomen. Passage of more gas than usual.  Walking can help get rid of the air that was put into your GI tract during the procedure and reduce the bloating. If you had a lower endoscopy (such as a colonoscopy or flexible sigmoidoscopy) you may notice spotting of blood in your stool or on the toilet paper. If you underwent a bowel prep for your procedure, you may not have a normal bowel movement for a few days.  Please Note:  You might notice some irritation and congestion in your nose or some drainage.  This is from the oxygen used during your procedure.  There is no need for concern and it should clear up in a day or so.  SYMPTOMS TO REPORT IMMEDIATELY:   Following upper endoscopy (EGD)  Vomiting of blood or coffee ground material  New chest pain or pain under the shoulder blades  Painful or persistently difficult swallowing  New shortness of breath  Fever of 100F or higher  Black, tarry-looking stools  For urgent or emergent issues, a gastroenterologist can be reached at any hour by calling [phone].   DIET:  We do recommend a small meal at first, but then you may proceed to your regular diet.  Drink plenty of fluids but you should avoid alcoholic beverages for 24 hours.  ACTIVITY:  You should plan to take it easy for the rest  of today and you should NOT DRIVE or use heavy machinery until tomorrow (because of the sedation medicines used during the test).    FOLLOW UP: Our staff will call the number listed on your records 48-72 hours following your procedure to check on you and address any questions or concerns that you may have regarding the information given to you following your procedure. If we do not reach you, we will leave a message.  We will attempt to reach you two times.  During this call, we will ask if you have developed any symptoms of COVID 19. If you develop any symptoms (ie: fever, flu-like symptoms, shortness of breath, cough etc.) before then, please call [phone].  If you test positive for Covid 19 in the 2 weeks post procedure, please call and report this information to [place].    If any biopsies were taken you will be contacted by phone or by letter within the next 1-3 weeks.  Please call us at [phone] if you have not heard about the biopsies in 3 weeks.    SIGNATURES/CONFIDENTIALITY: You and/or your care partner have signed paperwork which will be entered into your electronic medical record.  These signatures attest to the fact that that the information above on your After Visit Summary has been reviewed and is understood.  Full responsibility of the confidentiality of this discharge information lies with you and/or your care-partner.

## 2019-02-15 NOTE — Op Note (Signed)
[facility] Patient Name: Meredith Church Procedure Date: 02/15/2019 8:57 AM MRN: [id number] Endoscopist: Milus Banister , MD Age: 63 Referring MD:  Date of Birth: 30-Apr-1956 Gender: Female Account #: [id number] Procedure:                Upper GI endoscopy Indications:              Dysphagia, Heartburn Medicines:                Monitored Anesthesia Care Procedure:                Pre-Anesthesia Assessment:                           - Prior to the procedure, a History and Physical                            was performed, and patient medications and                            allergies were reviewed. The patient's tolerance of                            previous anesthesia was also reviewed. The risks                            and benefits of the procedure and the sedation                            options and risks were discussed with the patient.                            All questions were answered, and informed consent                            was obtained. Prior Anticoagulants: The patient has                            taken no previous anticoagulant or antiplatelet                            agents. ASA Grade Assessment: II - A patient with                            mild systemic disease. After reviewing the risks                            and benefits, the patient was deemed in                            satisfactory condition to undergo the procedure.                           After obtaining informed consent, the endoscope was  passed under direct vision. Throughout the                            procedure, the patient's blood pressure, pulse, and                            oxygen saturations were monitored continuously. The                            Endoscope was introduced through the mouth, and                            advanced to the second part of duodenum. The upper                            GI endoscopy was accomplished  without difficulty.                            The patient tolerated the procedure well. Scope In: Scope Out: Findings:                 Mild inflammation characterized by erosions,                            erythema and friability was found in the entire                            examined stomach. Biopsies were taken with a cold                            forceps for histology.                           The exam was otherwise without abnormality. Complications:            No immediate complications. Estimated blood loss:                            None. Impression:               - Mild gastritis, biopsied to check for H. pylori.                           - The examination was otherwise normal. Recommendation:           - Patient has a contact number available for                            emergencies. The signs and symptoms of potential                            delayed complications were discussed with the                            patient. Return to normal activities tomorrow.  Written discharge instructions were provided to the                            patient.                           - Resume previous diet.                           - Continue present medications. Ideally you should                            take your protonix 20-30 minutes before breakfast                            and dinner meals.                           - Await pathology results. Milus Banister, MD 02/15/2019 9:10:07 AM This report has been signed electronically.

## 2019-02-15 NOTE — Progress Notes (Signed)
Report given to PACU, vss 

## 2019-02-15 NOTE — Progress Notes (Signed)
Pt coughing and restless upon recovery admit.  CRNA W. Wall administered albuterol breathing treatment and sat pt up in high fowler's position.  Pt. Breathing better and less restless.  O2 sat 99%.

## 2019-02-15 NOTE — Progress Notes (Signed)
Meredith Church , CMA- Temp Judy Branson, CMA- Vitals  

## 2019-02-15 NOTE — Progress Notes (Signed)
Called to room to assist during endoscopic procedure.  Patient ID and intended procedure confirmed with present staff. Received instructions for my participation in the procedure from the performing physician.  

## 2019-02-17 ENCOUNTER — Telehealth: Payer: Self-pay | Admitting: *Deleted

## 2019-02-17 NOTE — Telephone Encounter (Signed)
  Follow up Call-  Call back number 02/15/2019  Post procedure Call Back phone  # [phone]  Permission to leave phone message Yes  Some recent data might be hidden     Patient questions:  Do you have a fever, pain , or abdominal swelling? No. Pain Score  0 *  Have you tolerated food without any problems? Yes.    Have you been able to return to your normal activities? Yes.    Do you have any questions about your discharge instructions: Diet   No. Medications  No. Follow up visit  No.  Do you have questions or concerns about your Care? No.  Actions: * If pain score is 4 or above: No action needed, pain <4.  1. Have you developed a fever since your procedure? No  2.   Have you had an respiratory symptoms (SOB or cough) since your procedure? NO  3.   Have you tested positive for COVID 19 since your procedure NO  4.   Have you had any family members/close contacts diagnosed with the COVID 19 since your procedure?  NO   If yes to any of these questions please route to Joylene John, RN and Alphonsa Gin, RN.

## 2019-02-18 ENCOUNTER — Encounter: Payer: Medicare Other | Admitting: Gastroenterology

## 2019-02-23 ENCOUNTER — Encounter: Payer: Self-pay | Admitting: Gastroenterology

## 2019-03-05 ENCOUNTER — Other Ambulatory Visit (HOSPITAL_COMMUNITY): Payer: Self-pay | Admitting: Psychiatry

## 2019-04-18 ENCOUNTER — Other Ambulatory Visit (HOSPITAL_COMMUNITY): Payer: Self-pay | Admitting: Psychiatry

## 2019-04-18 DIAGNOSIS — G4709 Other insomnia: Secondary | ICD-10-CM

## 2019-04-18 DIAGNOSIS — F411 Generalized anxiety disorder: Secondary | ICD-10-CM

## 2019-04-19 ENCOUNTER — Other Ambulatory Visit (HOSPITAL_COMMUNITY): Payer: Self-pay

## 2019-04-19 DIAGNOSIS — G4709 Other insomnia: Secondary | ICD-10-CM

## 2019-04-19 DIAGNOSIS — F411 Generalized anxiety disorder: Secondary | ICD-10-CM

## 2019-04-19 MED ORDER — ALPRAZOLAM 0.25 MG PO TABS: 0.2500 mg | ORAL_TABLET | Freq: Two times a day (BID) | ORAL | 0 refills | Status: DC | PRN

## 2019-05-04 ENCOUNTER — Ambulatory Visit (INDEPENDENT_AMBULATORY_CARE_PROVIDER_SITE_OTHER): Payer: Medicare Other | Admitting: Psychiatry

## 2019-05-04 ENCOUNTER — Other Ambulatory Visit: Payer: Self-pay

## 2019-05-04 DIAGNOSIS — F17210 Nicotine dependence, cigarettes, uncomplicated: Secondary | ICD-10-CM | POA: Diagnosis not present

## 2019-05-04 DIAGNOSIS — G4709 Other insomnia: Secondary | ICD-10-CM | POA: Diagnosis not present

## 2019-05-04 DIAGNOSIS — F411 Generalized anxiety disorder: Secondary | ICD-10-CM

## 2019-05-04 DIAGNOSIS — F325 Major depressive disorder, single episode, in full remission: Secondary | ICD-10-CM

## 2019-05-04 MED ORDER — ALPRAZOLAM 0.25 MG PO TABS: 0.2500 mg | ORAL_TABLET | Freq: Two times a day (BID) | ORAL | 4 refills | Status: DC | PRN

## 2019-05-04 MED ORDER — DULOXETINE HCL 60 MG PO CPEP: ORAL_CAPSULE | ORAL | 4 refills | Status: DC

## 2019-05-04 NOTE — Progress Notes (Signed)
[facility] MD/PA/NP OP Progress Note  05/04/2019 2:49 PM Meredith Church  MRN:  [id number]  Chief Complaint: Depression  HPI: Today the patient is doing well.  Her mood is good.  She is sleeping and eating well.  She is got good energy.  She is a number of close friends that she has contact with.  She has no cough or shortness of breath or any physical complaints at this time.  Patient is not psychotic.  She drinks no alcohol uses no drugs.  She takes her medicines just as prescribed.  However the patient reduced her Cymbalta down to 60 mg because said a higher dose affected her sleep.  She says her mood is good she takes a small dose of Xanax which helps her.  Overall she is stable.    ICD-10-CM   1. Other insomnia  G47.09 ALPRAZolam (XANAX) 0.25 MG tablet    DISCONTINUED: ALPRAZolam (XANAX) 0.25 MG tablet  2. GAD (generalized anxiety disorder)  F41.1 ALPRAZolam (XANAX) 0.25 MG tablet    DISCONTINUED: ALPRAZolam (XANAX) 0.25 MG tablet    Past Psychiatric History: See intake H&P for full details. Reviewed, with no updates at this time.   Past Medical History:  Past Medical History:  Diagnosis Date  . Anxiety Dx 2007  . Arthritis   . COPD (chronic obstructive pulmonary disease) ([place])   . Eczema   . Hyperlipidemia Dx 2010  . Hypertension Dx 2010  . Numbness and tingling of lower extremity     Past Surgical History:  Procedure Laterality Date  . ABDOMINAL HYSTERECTOMY    . FRACTURE SURGERY     Left pinky finger  . LIPOMA EXCISION    . LUMBAR LAMINECTOMY/DECOMPRESSION MICRODISCECTOMY N/A 07/03/2016   Procedure: LUMBAR FOUR - LUMBAR FIVE LAMINECTOMY/DISKECTOMY;  Surgeon: [name] [name], MD;  Location: [facility];  Service: Neurosurgery;  Laterality: N/A;  LUMBAR FOUR - LUMBAR FIVE LAMINECTOMY/DISKECTOMY  . SALIVARY GLAND SURGERY    . sciatica     Dr. Sharmaine Base oct. 2017  . TIBIA FRACTURE SURGERY     right  . TOTAL HIP ARTHROPLASTY Right 02/24/2017   Procedure: RIGHT TOTAL HIP ARTHROPLASTY  ANTERIOR APPROACH;  Surgeon: Paralee Cancel, MD;  Location: [facility];  Service: Orthopedics;  Laterality: Right;    Family Psychiatric History: See intake H&P for full details. Reviewed, with no updates at this time.   Family History:  Family History  Problem Relation Age of Onset  . Diabetes Mother   . Diabetes Sister   . Cancer Brother        colon cancer     Social History:  Social History   Socioeconomic History  . Marital status: Single    Spouse name: Not on file  . Number of children: Not on file  . Years of education: Not on file  . Highest education level: Not on file  Occupational History  . Not on file  Social Needs  . Financial resource strain: Not on file  . Food insecurity    Worry: Not on file    Inability: Not on file  . Transportation needs    Medical: Not on file    Non-medical: Not on file  Tobacco Use  . Smoking status: Current Some Day Smoker    Packs/day: 0.20    Years: 30.00    Pack years: 6.00    Types: Cigarettes  . Smokeless tobacco: Never Used  . Tobacco comment: Wears Nicotine patch   Substance and Sexual Activity  .  Alcohol use: Yes    Alcohol/week: 0.0 standard drinks    Comment: social  . Drug use: No  . Sexual activity: Yes    Partners: Male  Lifestyle  . Physical activity    Days per week: Not on file    Minutes per session: Not on file  . Stress: Not on file  Relationships  . Social Herbalist on phone: Not on file    Gets together: Not on file    Attends religious service: Not on file    Active member of club or organization: Not on file    Attends meetings of clubs or organizations: Not on file    Relationship status: Not on file  Other Topics Concern  . Not on file  Social History Narrative  . Not on file    Allergies:  Allergies  Allergen Reactions  . Other Other (See Comments)     Nickel "breaks my skin out" Fire ants    Metabolic Disorder Labs: Lab Results  Component Value Date   HGBA1C 5.9  10/03/2013   No results found for: PROLACTIN Lab Results  Component Value Date   CHOL 188 10/03/2013   TRIG 267 (H) 10/03/2013   HDL 42 10/03/2013   CHOLHDL 4.5 10/03/2013   VLDL 53 (H) 10/03/2013   LDLCALC 93 10/03/2013   Lab Results  Component Value Date   TSH 0.936 10/03/2013    Therapeutic Level Labs: No results found for: LITHIUM No results found for: VALPROATE No components found for:  CBMZ  Current Medications: Current Outpatient Medications  Medication Sig Dispense Refill  . albuterol (PROVENTIL HFA;VENTOLIN HFA) 108 (90 Base) MCG/ACT inhaler Inhale 2 puffs into the lungs every 6 (six) hours as needed for wheezing or shortness of breath. 1 Inhaler 6  . ALPRAZolam (XANAX) 0.25 MG tablet Take 1 tablet (0.25 mg total) by mouth 2 (two) times daily as needed for anxiety. 60 tablet 4  . amLODipine (NORVASC) 10 MG tablet Take 1 tablet (10 mg total) by mouth daily. 30 tablet 3  . ammonium lactate (LAC-HYDRIN) 12 % cream Apply topically as needed for dry skin. 385 g 0  . Biotin 1000 MCG tablet Take 1,000 mcg by mouth daily.     . DULoxetine (CYMBALTA) 60 MG capsule 1 qam 30 capsule 4  . fluconazole (DIFLUCAN) 150 MG tablet Can take additional dose three days later if symptoms persist 2 tablet 0  . hydroquinone 4 % cream Apply topically 2 (two) times daily. To dark areas of face. Use of SPF 30 or 50 sunscreen moisturizer 28.35 g 0  . L-LYSINE PO Take 1 tablet by mouth daily.     . metoprolol tartrate (LOPRESSOR) 50 MG tablet Take 1 tablet (50 mg total) by mouth 2 (two) times daily. 60 tablet 11  . metroNIDAZOLE (METROGEL) 0.75 % vaginal gel Place 1 Applicatorful vaginally at bedtime. Apply one applicatorful to vagina at bedtime for 5 days 70 g 1  . Misc. Devices (CANE) MISC 1 each by Does not apply route daily. 1 each 0  . Misc. Devices (HUGO ROLLING WALKER BASIC) MISC 1 each by Does not apply route daily. 1 each 0  . triamcinolone ointment (KENALOG) 0.5 % APPLY 1 APPLICATION  TOPICALLY 2 TIMES DAILY. APPLY TO HANDS 30 g 2   No current facility-administered medications for this visit.      Musculoskeletal: Strength & Muscle Tone: decreased Gait & Station: unsteady, shuffle Patient leans: N/A  Psychiatric Specialty Exam:  ROS  There were no vitals taken for this visit.There is no height or weight on file to calculate BMI.  General Appearance: Casual and Fairly Groomed  Eye Contact:  Fair  Speech:  Clear and Coherent, Normal Rate and wears dentures  Volume:  Normal  Mood:  Dysphoric  Affect:  Congruent  Thought Process:  Goal Directed and Descriptions of Associations: Intact  Orientation:  Full (Time, Place, and Person)  Thought Content: Logical   Suicidal Thoughts:  No  Homicidal Thoughts:  No  Memory:  Immediate;   Fair  Judgement:  Fair  Insight:  Fair  Psychomotor Activity:  Normal  Concentration:  Concentration: Poor  Recall:  [employer] of Knowledge: Fair  Language: Fair  Akathisia:  Negative  Handed:  Right  AIMS (if indicated): not done  Assets:  Communication Skills Desire for Improvement Financial Resources/Insurance Housing  ADL's:  Intact  Cognition: WNL  Sleep:  Poor   Screenings: GAD-7     Office Visit from 04/13/2017 in [facility] Office Visit from 11/28/2016 in [facility] Office Visit from 11/14/2016 in [facility] Office Visit from 09/02/2016 in [facility] Office Visit from 08/15/2016 in [facility]  Total GAD-7 Score  18  18  20  19  20     PHQ2-9     Counselor from 02/10/2018 in [facility] Counselor from 01/26/2018 in [facility] Office Visit from 04/13/2017 in [facility] Office Visit from 11/28/2016 in [facility] Office Visit from 11/14/2016 in [facility]  PHQ-2 Total Score  6  6  6  5  6   PHQ-9 Total Score  16  17  18  17  21        Assessment and Plan:    This patient is doing well.  Her #1 problem is that of major depression.  The patient continues taking Cymbalta as we prescribed 60 mg.  The patient also continue take a small dose of Xanax which I think this is for sleep and her anxiety.  I think the patient is very stable.  She is not suicidal and functioning quite well. 1. Other insomnia   2. GAD (generalized anxiety disorder)     Status of current problems: unchanged  Labs Ordered: No orders of the defined types were placed in this encounter.   Labs Reviewed: n/a  Collateral Obtained/Records Reviewed: n/a  Plan:  Elavil discontinued Xanax 0.25 mg BID prn anxiety/sleep Turn to clinic in 12 weeks

## 2019-05-13 ENCOUNTER — Inpatient Hospital Stay (HOSPITAL_COMMUNITY)
Admission: EM | Admit: 2019-05-13 | Discharge: 2019-05-15 | DRG: 812 | Disposition: A | Discharge status: Home / Self Care | Payer: Medicare Other | Attending: Internal Medicine | Admitting: Internal Medicine

## 2019-05-13 ENCOUNTER — Other Ambulatory Visit: Payer: Self-pay

## 2019-05-13 ENCOUNTER — Encounter (HOSPITAL_COMMUNITY): Payer: Self-pay | Admitting: *Deleted

## 2019-05-13 DIAGNOSIS — I10 Essential (primary) hypertension: Secondary | ICD-10-CM | POA: Diagnosis present

## 2019-05-13 DIAGNOSIS — Z79899 Other long term (current) drug therapy: Secondary | ICD-10-CM

## 2019-05-13 DIAGNOSIS — F17210 Nicotine dependence, cigarettes, uncomplicated: Secondary | ICD-10-CM | POA: Diagnosis present

## 2019-05-13 DIAGNOSIS — E785 Hyperlipidemia, unspecified: Secondary | ICD-10-CM | POA: Diagnosis present

## 2019-05-13 DIAGNOSIS — Z20828 Contact with and (suspected) exposure to other viral communicable diseases: Secondary | ICD-10-CM | POA: Diagnosis present

## 2019-05-13 DIAGNOSIS — F411 Generalized anxiety disorder: Secondary | ICD-10-CM | POA: Diagnosis present

## 2019-05-13 DIAGNOSIS — F17200 Nicotine dependence, unspecified, uncomplicated: Secondary | ICD-10-CM | POA: Diagnosis present

## 2019-05-13 DIAGNOSIS — D509 Iron deficiency anemia, unspecified: Secondary | ICD-10-CM | POA: Diagnosis present

## 2019-05-13 DIAGNOSIS — J42 Unspecified chronic bronchitis: Secondary | ICD-10-CM | POA: Diagnosis not present

## 2019-05-13 DIAGNOSIS — D539 Nutritional anemia, unspecified: Secondary | ICD-10-CM | POA: Diagnosis present

## 2019-05-13 DIAGNOSIS — D649 Anemia, unspecified: Secondary | ICD-10-CM

## 2019-05-13 DIAGNOSIS — R718 Other abnormality of red blood cells: Secondary | ICD-10-CM | POA: Diagnosis present

## 2019-05-13 DIAGNOSIS — R600 Localized edema: Secondary | ICD-10-CM | POA: Diagnosis present

## 2019-05-13 DIAGNOSIS — J449 Chronic obstructive pulmonary disease, unspecified: Secondary | ICD-10-CM | POA: Diagnosis present

## 2019-05-13 DIAGNOSIS — Z6829 Body mass index (BMI) 29.0-29.9, adult: Secondary | ICD-10-CM

## 2019-05-13 DIAGNOSIS — E669 Obesity, unspecified: Secondary | ICD-10-CM | POA: Diagnosis present

## 2019-05-13 DIAGNOSIS — R0600 Dyspnea, unspecified: Secondary | ICD-10-CM

## 2019-05-13 DIAGNOSIS — J9811 Atelectasis: Secondary | ICD-10-CM | POA: Diagnosis present

## 2019-05-13 DIAGNOSIS — R0902 Hypoxemia: Secondary | ICD-10-CM | POA: Diagnosis present

## 2019-05-13 LAB — BASIC METABOLIC PANEL
Anion gap: 9 (ref 5–15)
BUN: 13 mg/dL (ref 8–23)
CO2: 25 mmol/L (ref 22–32)
Calcium: 9.7 mg/dL (ref 8.9–10.3)
Chloride: 105 mmol/L (ref 98–111)
Creatinine, Ser: 0.85 mg/dL (ref 0.44–1.00)
GFR calc Af Amer: >60 mL/min (ref >60)
GFR calc non Af Amer: >60 mL/min (ref >60)
Glucose, Bld: 121 mg/dL — ABNORMAL HIGH (ref 70–99)
Potassium: 3.7 mmol/L (ref 3.5–5.1)
Sodium: 139 mmol/L (ref 135–145)

## 2019-05-13 LAB — RETICULOCYTES
Immature Retic Fract: 40.8 % — ABNORMAL HIGH (ref 2.3–15.9)
RBC.: 2.35 MIL/uL — ABNORMAL LOW (ref 3.87–5.11)
Retic Count, Absolute: 70.7 10*3/uL (ref 19.0–186.0)
Retic Ct Pct: 3 % (ref 0.4–3.1)

## 2019-05-13 LAB — CBC WITH DIFFERENTIAL/PLATELET
Abs Immature Granulocytes: 0.06 10*3/uL (ref 0.00–0.07)
Basophils Absolute: 0.1 10*3/uL (ref 0.0–0.1)
Basophils Relative: 1 %
Eosinophils Absolute: 0.2 10*3/uL (ref 0.0–0.5)
Eosinophils Relative: 1 %
HCT: 21 % — ABNORMAL LOW (ref 36.0–46.0)
Hemoglobin: 5.7 g/dL — CL (ref 12.0–15.0)
Immature Granulocytes: 1 %
Lymphocytes Relative: 22 %
Lymphs Abs: 2.4 10*3/uL (ref 0.7–4.0)
MCH: 24.5 pg — ABNORMAL LOW (ref 26.0–34.0)
MCHC: 27.1 g/dL — ABNORMAL LOW (ref 30.0–36.0)
MCV: 90.1 fL (ref 80.0–100.0)
Monocytes Absolute: 0.8 10*3/uL (ref 0.1–1.0)
Monocytes Relative: 7 %
Neutro Abs: 7.1 10*3/uL (ref 1.7–7.7)
Neutrophils Relative %: 68 %
Platelets: 443 10*3/uL — ABNORMAL HIGH (ref 150–400)
RBC: 2.33 MIL/uL — ABNORMAL LOW (ref 3.87–5.11)
RDW: 19.1 % — ABNORMAL HIGH (ref 11.5–15.5)
WBC: 10.6 10*3/uL — ABNORMAL HIGH (ref 4.0–10.5)
nRBC: 0.8 % — ABNORMAL HIGH (ref 0.0–0.2)

## 2019-05-13 LAB — FERRITIN: Ferritin: 8 ng/mL — ABNORMAL LOW (ref 11–307)

## 2019-05-13 LAB — FOLATE: Folate: 11 ng/mL (ref >5.9)

## 2019-05-13 LAB — IRON AND TIBC
Iron: <5 ug/dL — ABNORMAL LOW (ref 28–170)
Saturation Ratios: 0 % — ABNORMAL LOW (ref 10.4–31.8)
TIBC: 524 ug/dL — ABNORMAL HIGH (ref 250–450)
UIBC: 522 ug/dL

## 2019-05-13 LAB — VITAMIN B12: Vitamin B-12: 247 pg/mL (ref 180–914)

## 2019-05-13 LAB — PREPARE RBC (CROSSMATCH)

## 2019-05-13 MED ORDER — LORAZEPAM 2 MG/ML IJ SOLN
1.0000 mg | Freq: Once | INTRAMUSCULAR | Status: AC
  Administered 2019-05-13: 1 mg via INTRAVENOUS
  Filled 2019-05-13: qty 1

## 2019-05-13 MED ORDER — SODIUM CHLORIDE 0.9 % IV SOLN
10.0000 mL/h | Freq: Once | INTRAVENOUS | Status: AC
  Administered 2019-05-13: 22:00:00 10 mL/h via INTRAVENOUS

## 2019-05-13 NOTE — ED Triage Notes (Signed)
Pt states that her PCP sent her over for a hemoglobin of 4.  Pt states that for the past three days, pt's legs have become swollen.  Pt states for the past two weeks, pt has not been having energy.  Pt denies any other new symptoms. Hx chronic right leg and hip pain. Pt a/o x 4 and normally ambulatory.

## 2019-05-13 NOTE — ED Notes (Signed)
Pt states that she has developed a "wet" cough over the last 3-4 days, time line coinciding with the swelling in her legs

## 2019-05-13 NOTE — ED Notes (Addendum)
CRITICAL VALUE STICKER  CRITICAL VALUE: HGB 5.7  RECEIVER (on-site recipient of call): Shakeema Lippman RN   [place] NOTIFIED: 05/13/19 2130  MESSENGER (representative from lab): Ovid Curd   MD NOTIFIED: Wilson Singer MD  TIME OF NOTIFICATION: 2147

## 2019-05-13 NOTE — H&P (Signed)
History and Physical   Meredith Church [id number] DOB: December 26, 1955 DOA: 05/13/2019  Referring MD/NP/PA: Dr. Wilson Singer  PCP: Nolene Ebbs, MD   Outpatient Specialists: None  Patient coming from: Home  Chief Complaint: Anemia  HPI: Meredith Church is a 63 y.o. female with medical history significant of COPD, hypertension, hyperlipidemia, depression with anxiety who presented with low hemoglobin.  Patient has been feeling bad in the last 2 weeks.  She feels so hot that she has been going to the [employer] [place] station and by her eyes.  She chews ice literally all day.  She has no fever or chills no nausea vomiting or diarrhea.  Patient was seen by PCP and hemoglobin was around 4.  She denied any active bleed.  Denied any melena or bright red blood per rectum.  No excessive menstruation.  Patient has never had any history of anemia or blood transfusion.  She has felt very weak.  She has had numbness and tingling of her lower extremity as well.  Patient was seen in the ER with hemoglobin of 5.7.  Her MCV is 90.1 however.  She is being admitted with symptomatic anemia.  She is currently crying and varied depressed.  No fever or chills.  No nausea vomiting or diarrhea.  Guaiacs not done in the ER but patient given blood prior to admission.  She is being admitted with symptomatic anemia and for work-up..   ED Course: Temperature is 98.7 blood pressure 150/81 pulse 94 respiratory rate of 30 oxygen sat 94% room air.  White count is 10.6 hemoglobin 5.7 platelets 443.  Chemistry appears to be entirely within normal glucose 121.  Anemia studies showed total iron of less than 5 TIBC 524 0 saturation ferritin of 8 and folate 11.  B12 currently pending  Review of Systems: As per HPI otherwise 10 point review of systems negative.    Past Medical History:  Diagnosis Date  . Anxiety Dx 2007  . Arthritis   . COPD (chronic obstructive pulmonary disease) ([place])   . Eczema   . Hyperlipidemia Dx 2010  . Hypertension Dx  2010  . Numbness and tingling of lower extremity     Past Surgical History:  Procedure Laterality Date  . ABDOMINAL HYSTERECTOMY    . FRACTURE SURGERY     Left pinky finger  . LIPOMA EXCISION    . LUMBAR LAMINECTOMY/DECOMPRESSION MICRODISCECTOMY N/A 07/03/2016   Procedure: LUMBAR FOUR - LUMBAR FIVE LAMINECTOMY/DISKECTOMY;  Surgeon: [name] [name], MD;  Location: [facility];  Service: Neurosurgery;  Laterality: N/A;  LUMBAR FOUR - LUMBAR FIVE LAMINECTOMY/DISKECTOMY  . SALIVARY GLAND SURGERY    . sciatica     Dr. Sharmaine Base oct. 2017  . TIBIA FRACTURE SURGERY     right  . TOTAL HIP ARTHROPLASTY Right 02/24/2017   Procedure: RIGHT TOTAL HIP ARTHROPLASTY ANTERIOR APPROACH;  Surgeon: Paralee Cancel, MD;  Location: [facility];  Service: Orthopedics;  Laterality: Right;     reports that she has been smoking cigarettes. She has a 6.00 pack-year smoking history. She has never used smokeless tobacco. She reports current alcohol use. She reports that she does not use drugs.  Allergies  Allergen Reactions  . Other Other (See Comments)     Nickel "breaks my skin out" Fire ants    Family History  Problem Relation Age of Onset  . Diabetes Mother   . Diabetes Sister   . Cancer Brother        colon cancer  Prior to Admission medications   Medication Sig Start Date End Date Taking? Authorizing Provider  ALPRAZolam (XANAX) 0.25 MG tablet Take 1 tablet (0.25 mg total) by mouth 2 (two) times daily as needed for anxiety. 05/04/19 05/03/20 Yes Plovsky, Berneta Sages, MD  ammonium lactate (LAC-HYDRIN) 12 % cream Apply topically as needed for dry skin. 06/10/17  Yes Charlott Rakes, MD  aspirin EC 81 MG tablet Take 81 mg by mouth daily. 04/05/19  Yes [provider]  benzonatate (TESSALON) 100 MG capsule Take 100 mg by mouth every 8 (eight) hours as needed for cough.  04/07/19  Yes [provider]  Biotin 1000 MCG tablet Take 1,000 mcg by mouth daily.    Yes [provider]  DULoxetine  (CYMBALTA) 60 MG capsule 1 qam Patient taking differently: Take 60 mg by mouth daily. 1 qam 05/04/19  Yes Plovsky, Berneta Sages, MD  L-LYSINE PO Take 1 tablet by mouth daily.    Yes [provider]  metoprolol tartrate (LOPRESSOR) 50 MG tablet Take 1 tablet (50 mg total) by mouth 2 (two) times daily. 06/10/17  Yes Newlin, Charlane Ferretti, MD  nicotine (NICODERM CQ - DOSED IN MG/24 HOURS) 14 mg/24hr patch Place 1 patch onto the skin daily. 11/29/18  Yes [provider]  nitroGLYCERIN (NITROSTAT) 0.4 MG SL tablet See admin instructions. 02/02/19  Yes [provider]  oxyCODONE-acetaminophen (PERCOCET/ROXICET) 5-325 MG tablet Take 1 tablet by mouth daily. None after 6 PM. 04/11/19  Yes [provider]  pantoprazole (PROTONIX) 40 MG tablet Take 40 mg by mouth 2 (two) times daily. 03/25/19  Yes [provider]  triamcinolone ointment (KENALOG) 0.5 % APPLY 1 APPLICATION TOPICALLY 2 TIMES DAILY. APPLY TO HANDS 04/17/17  Yes Funches, Josalyn, MD  albuterol (PROVENTIL HFA;VENTOLIN HFA) 108 (90 Base) MCG/ACT inhaler Inhale 2 puffs into the lungs every 6 (six) hours as needed for wheezing or shortness of breath. Patient not taking: Reported on 05/13/2019 06/10/17   Charlott Rakes, MD  amLODipine (NORVASC) 10 MG tablet Take 1 tablet (10 mg total) by mouth daily. Patient not taking: Reported on 05/13/2019 03/11/18   Charlott Rakes, MD  fluconazole (DIFLUCAN) 150 MG tablet Can take additional dose three days later if symptoms persist Patient not taking: Reported on 05/13/2019 03/29/18   Shelly Bombard, MD  hydroquinone 4 % cream Apply topically 2 (two) times daily. To dark areas of face. Use of SPF 30 or 50 sunscreen moisturizer Patient not taking: Reported on 05/13/2019 04/13/17   Boykin Nearing, MD  metroNIDAZOLE (METROGEL) 0.75 % vaginal gel Place 1 Applicatorful vaginally at bedtime. Apply one applicatorful to vagina at bedtime for 5 days Patient not taking: Reported on 05/13/2019  03/11/18   Sloan Leiter, MD  Misc. Devices (CANE) MISC 1 each by Does not apply route daily. 11/02/15   Boykin Nearing, MD  Misc. Devices (HUGO ROLLING WALKER BASIC) MISC 1 each by Does not apply route daily. 11/14/16   Funches, Adriana Mccallum, MD  pravastatin (PRAVACHOL) 40 MG tablet Take 1 tablet (40 mg total) by mouth daily. 10/03/13 01/30/14  Tresa Garter, MD    Physical Exam: Vitals:   05/13/19 1910 05/13/19 1938 05/13/19 1953 05/13/19 2000  BP: 130/69  (!) 117/101 114/62  Pulse: 94  93 93  Resp: 16  18 17   Temp:  98.2 F (36.8 C)    TempSrc:  Oral    SpO2: 98%  97% 97%  Weight: 78.5 kg     Height: 5\' 4"  (1.626 m)  Constitutional: NAD, in tears, looks pale Vitals:   05/13/19 1910 05/13/19 1938 05/13/19 1953 05/13/19 2000  BP: 130/69  (!) 117/101 114/62  Pulse: 94  93 93  Resp: 16  18 17   Temp:  98.2 F (36.8 C)    TempSrc:  Oral    SpO2: 98%  97% 97%  Weight: 78.5 kg     Height: 5\' 4"  (1.626 m)      Eyes: PERRL, lids and conjunctivae pale ENMT: Mucous membranes are moist. Posterior pharynx clear of any exudate or lesions.Normal dentition.  Neck: normal, supple, no masses, no thyromegaly Respiratory: clear to auscultation bilaterally, no wheezing, no crackles. Normal respiratory effort. No accessory muscle use.  Cardiovascular: Regular rate and rhythm, no murmurs / rubs / gallops. No extremity edema. 2+ pedal pulses. No carotid bruits.  Abdomen: no tenderness, no masses palpated. No hepatosplenomegaly. Bowel sounds positive.  Musculoskeletal: no clubbing / cyanosis. No joint deformity upper and lower extremities. Good ROM, no contractures. Normal muscle tone.  Skin: no rashes, lesions, ulcers. No induration Neurologic: CN 2-12 grossly intact. Sensation intact, DTR normal. Strength 5/5 in all 4.  Psychiatric: Normal judgment and insight. Alert and oriented x 3.  Depressed    Labs on Admission: I have personally reviewed following labs and imaging studies   CBC: Recent Labs  Lab 05/13/19 2052  WBC 10.6*  NEUTROABS 7.1  HGB 5.7*  HCT 21.0*  MCV 90.1  PLT [id number]*   Basic Metabolic Panel: Recent Labs  Lab 05/13/19 2052  NA 139  K 3.7  CL 105  CO2 25  GLUCOSE 121*  BUN 13  CREATININE 0.85  CALCIUM 9.7   GFR: Estimated Creatinine Clearance: 66.9 mL/min (by C-G formula based on SCr of 0.85 mg/dL). Liver Function Tests: No results for input(s): AST, ALT, ALKPHOS, BILITOT, PROT, ALBUMIN in the last 168 hours. No results for input(s): LIPASE, AMYLASE in the last 168 hours. No results for input(s): AMMONIA in the last 168 hours. Coagulation Profile: No results for input(s): INR, PROTIME in the last 168 hours. Cardiac Enzymes: No results for input(s): CKTOTAL, CKMB, CKMBINDEX, TROPONINI in the last 168 hours. BNP (last 3 results) No results for input(s): PROBNP in the last 8760 hours. HbA1C: No results for input(s): HGBA1C in the last 72 hours. CBG: No results for input(s): GLUCAP in the last 168 hours. Lipid Profile: No results for input(s): CHOL, HDL, LDLCALC, TRIG, CHOLHDL, LDLDIRECT in the last 72 hours. Thyroid Function Tests: No results for input(s): TSH, T4TOTAL, FREET4, T3FREE, THYROIDAB in the last 72 hours. Anemia Panel: Recent Labs    05/13/19 2209  RETICCTPCT 3.0   Urine analysis:    Component Value Date/Time   COLORURINE RED (A) 09/10/2013 1301   APPEARANCEUR TURBID (A) 09/10/2013 1301   LABSPEC 1.020 09/10/2013 1301   PHURINE 5.5 09/10/2013 1301   GLUCOSEU NEGATIVE 09/10/2013 1301   HGBUR LARGE (A) 09/10/2013 1301   BILIRUBINUR NEGATIVE 09/10/2013 1301   KETONESUR NEGATIVE 09/10/2013 1301   PROTEINUR 100 (A) 09/10/2013 1301   UROBILINOGEN 1.0 09/10/2013 1301   NITRITE NEGATIVE 09/10/2013 1301   LEUKOCYTESUR LARGE (A) 09/10/2013 1301   Sepsis Labs: @LABRCNTIP (procalcitonin:4,lacticidven:4) )No results found for this or any previous visit (from the past 240 hour(s)).   Radiological Exams on Admission:  No results found.  EKG: Independently reviewed.  It shows normal sinus rhythm with a rate of 19.  No significant ST changes.  Assessment/Plan Principal Problem:   Symptomatic anemia Active Problems:   Anxiety state  Essential hypertension, benign   Smoker   COPD (chronic obstructive pulmonary disease) ([facility])   Obese     #1 symptomatic anemia: Severe iron deficiency anemia.  Patient will be transfused 2 units of packed red blood cells.  I suspect nutritional anemia.  Her MCV is 90.  She has had tingling of her feet.  She reported probably have mixed nutritional anemia including B12.  I will check B12 level.  In the meantime she will need to be on iron therapy prior to discharge.  Check thyroid panel.  We may consider hematology consult.  #2 hypertension: Blood pressure is now controlled.  Continue treatment.  #3 possible UTI: Urine cultures pending.  Afebrile and no significant white count.  May be asymptomatic bacteriuria.  Empirically started on antibiotics in the ER.  #4 tobacco abuse: Nicotine patch and counseling  #5 anxiety disorder: Treat with home regimen.   DVT prophylaxis: SCD Code Status: Full code Family Communication: No family at bedside Disposition Plan: Home Consults called: None Admission status: Inpatient  Severity of Illness: The appropriate patient status for this patient is INPATIENT. Inpatient status is judged to be reasonable and necessary in order to provide the required intensity of service to ensure the patient's safety. The patient's presenting symptoms, physical exam findings, and initial radiographic and laboratory data in the context of their chronic comorbidities is felt to place them at high risk for further clinical deterioration. Furthermore, it is not anticipated that the patient will be medically stable for discharge from the hospital within 2 midnights of admission. The following factors support the patient status of inpatient.   " The  patient's presenting symptoms include generalized weakness and pica. " The worrisome physical exam findings include weakness and pale. " The initial radiographic and laboratory data are worrisome because of hemoglobin 5.7. " The chronic co-morbidities include hypertension.   * I certify that at the point of admission it is my clinical judgment that the patient will require inpatient hospital care spanning beyond 2 midnights from the point of admission due to high intensity of service, high risk for further deterioration and high frequency of surveillance required.Barbette Merino MD [facility] Pager [phone]  If 7PM-7AM, please contact night-coverage [email or web address] Password [email or web address]  05/13/2019, 11:01 PM

## 2019-05-13 NOTE — ED Notes (Signed)
ED TO INPATIENT HANDOFF REPORT  Name/Age/Gender Meredith Church 62 y.o. female  Code Status Code Status History    Date Active Date Inactive Code Status Order ID Comments User Context   02/24/2017 1613 02/26/2017 1724 Full Code [id number]  Meredith Church Inpatient   07/03/2016 1105 07/04/2016 1802 Full Code [id number]  [name], [name], MD Inpatient   Advance Care Planning Activity      Home/SNF/Other Home  Chief Complaint sent by pcp; hgb at 4  Level of Care/Admitting Diagnosis ED Disposition    ED Disposition Condition [place]: [place] [[id number]]  Level of Care: Telemetry [5]  Admit to tele based on following criteria: Complex arrhythmia (Bradycardia/Tachycardia)  Covid Evaluation: Asymptomatic Screening Protocol (No Symptoms)  Diagnosis: Symptomatic anemia [id number]  Admitting Physician: Elwyn Reach [[id number]]  Attending Physician: Elwyn Reach [[id number]]  Estimated length of stay: past midnight tomorrow  Certification:: I certify this patient will need inpatient services for at least 2 midnights  PT Class (Do Not Modify): Inpatient [101]  PT Acc Code (Do Not Modify): Private [1]       Medical History Past Medical History:  Diagnosis Date  . Anxiety Dx 2007  . Arthritis   . COPD (chronic obstructive pulmonary disease) ([place])   . Eczema   . Hyperlipidemia Dx 2010  . Hypertension Dx 2010  . Numbness and tingling of lower extremity     Allergies Allergies  Allergen Reactions  . Other Other (See Comments)     Nickel "breaks my skin out" Fire ants    IV Location/Drains/Wounds Patient Lines/Drains/Airways Status   Active Line/Drains/Airways    Name:   Placement date:   Placement time:   Site:   Days:   Peripheral IV 05/13/19 Left Antecubital   05/13/19    2150    Antecubital   less than 1   Airway   02/24/17    1114     808   Incision (Closed) 07/03/16 Back   07/03/16    0927     1044   Incision  (Closed) 02/24/17 Hip Right   02/24/17    1246     808          Labs/Imaging Results for orders placed or performed during the hospital encounter of 05/13/19 (from the past 48 hour(s))  CBC with Differential     Status: Abnormal   Collection Time: 05/13/19  8:52 PM  Result Value Ref Range   WBC 10.6 (H) 4.0 - 10.5 K/uL   RBC 2.33 (L) 3.87 - 5.11 MIL/uL   Hemoglobin 5.7 (LL) 12.0 - 15.0 g/dL    Comment: REPEATED TO VERIFY THIS CRITICAL RESULT HAS VERIFIED AND BEEN CALLED TO C.Demtrius Rounds BY NATHAN THOMPSON ON 08 28 2020 AT 2129, AND HAS BEEN READ BACK. CRITICAL RESULT VERIFIED    HCT 21.0 (L) 36.0 - 46.0 %   MCV 90.1 80.0 - 100.0 fL   MCH 24.5 (L) 26.0 - 34.0 pg   MCHC 27.1 (L) 30.0 - 36.0 g/dL   RDW 19.1 (H) 11.5 - 15.5 %   Platelets 443 (H) 150 - 400 K/uL   nRBC 0.8 (H) 0.0 - 0.2 %   Neutrophils Relative % 68 %   Neutro Abs 7.1 1.7 - 7.7 K/uL   Lymphocytes Relative 22 %   Lymphs Abs 2.4 0.7 - 4.0 K/uL   Monocytes Relative 7 %   Monocytes Absolute 0.8 0.1 - 1.0 K/uL  Eosinophils Relative 1 %   Eosinophils Absolute 0.2 0.0 - 0.5 K/uL   Basophils Relative 1 %   Basophils Absolute 0.1 0.0 - 0.1 K/uL   Immature Granulocytes 1 %   Abs Immature Granulocytes 0.06 0.00 - 0.07 K/uL    Comment: Performed at [facility], [place] [address], [place], [place] [id number]  Basic metabolic panel     Status: Abnormal   Collection Time: 05/13/19  8:52 PM  Result Value Ref Range   Sodium 139 135 - 145 mmol/L   Potassium 3.7 3.5 - 5.1 mmol/L   Chloride 105 98 - 111 mmol/L   CO2 25 22 - 32 mmol/L   Glucose, Bld 121 (H) 70 - 99 mg/dL   BUN 13 8 - 23 mg/dL   Creatinine, Ser 0.85 0.44 - 1.00 mg/dL   Calcium 9.7 8.9 - 10.3 mg/dL   GFR calc non Af Amer >60 >60 mL/min   GFR calc Af Amer >60 >60 mL/min   Anion gap 9 5 - 15    Comment: Performed at [facility], [place] [address] [place], [place] [address]  Type and screen [place]     Status:  None (Preliminary result)   Collection Time: 05/13/19  8:52 PM  Result Value Ref Range   ABO/RH(D) A POS    Antibody Screen NEG    Sample Expiration 05/16/2019,2359    Unit Number [id number]    Blood Component Type RED CELLS,LR    Unit division 00    Status of Unit ALLOCATED    Transfusion Status OK TO TRANSFUSE    Crossmatch Result      Compatible Performed at [facility], [place] [address], [place], [place] [address]    Unit Number [id number]    Blood Component Type RED CELLS,LR    Unit division 00    Status of Unit ALLOCATED    Transfusion Status OK TO TRANSFUSE    Crossmatch Result Compatible   Prepare RBC     Status: None   Collection Time: 05/13/19  9:51 PM  Result Value Ref Range   Order Confirmation      ORDER PROCESSED BY BLOOD BANK Performed at [facility], [place] [address] [place], [place] [address]   Reticulocytes     Status: Abnormal   Collection Time: 05/13/19 10:09 PM  Result Value Ref Range   Retic Ct Pct 3.0 0.4 - 3.1 %   RBC. 2.35 (L) 3.87 - 5.11 MIL/uL   Retic Count, Absolute 70.7 19.0 - 186.0 K/uL   Immature Retic Fract 40.8 (H) 2.3 - 15.9 %    Comment: Performed at [facility], [place] [address], [place], [place] [address]   No results found.  Pending Labs Unresulted Labs (From admission, onward)    Start     Ordered   05/13/19 2209  Vitamin B12  (Anemia Panel (PNL))  ONCE - STAT,   STAT     05/13/19 2208   05/13/19 2209  Folate  (Anemia Panel (PNL))  ONCE - STAT,   STAT     05/13/19 2208   05/13/19 2209  Iron and TIBC  (Anemia Panel (PNL))  ONCE - STAT,   STAT     05/13/19 2208   05/13/19 2209  Ferritin  (Anemia Panel (PNL))  ONCE - STAT,   STAT     05/13/19 2208   05/13/19 2052  Occult blood card to lab, stool  Once,  STAT     05/13/19 2051   Signed and Held  HIV antibody (Routine Testing)  Once,   R     Signed and Held   Signed and Held  CBC  Tomorrow morning,   R     Signed and  Held   Signed and Held  Comprehensive metabolic panel  Tomorrow morning,   R     Signed and Held   Signed and Held  TSH  Once,   R     Signed and Held          Vitals/Pain Today's Vitals   05/13/19 1924 05/13/19 1938 05/13/19 1953 05/13/19 2000  BP:   (!) 117/101 114/62  Pulse:   93 93  Resp:   18 17  Temp:  98.2 F (36.8 C)    TempSrc:  Oral    SpO2:   97% 97%  Weight:      Height:      PainSc: 8        Isolation Precautions No active isolations  Medications Medications  0.9 %  sodium chloride infusion (10 mL/hr Intravenous New Bag/Given 05/13/19 2200)  LORazepam (ATIVAN) injection 1 mg (1 mg Intravenous Given 05/13/19 2159)    Mobility walks

## 2019-05-14 ENCOUNTER — Inpatient Hospital Stay (HOSPITAL_COMMUNITY): Payer: Medicare Other

## 2019-05-14 DIAGNOSIS — I10 Essential (primary) hypertension: Secondary | ICD-10-CM

## 2019-05-14 DIAGNOSIS — F411 Generalized anxiety disorder: Secondary | ICD-10-CM

## 2019-05-14 DIAGNOSIS — F17200 Nicotine dependence, unspecified, uncomplicated: Secondary | ICD-10-CM

## 2019-05-14 DIAGNOSIS — J42 Unspecified chronic bronchitis: Secondary | ICD-10-CM

## 2019-05-14 LAB — COMPREHENSIVE METABOLIC PANEL
ALT: 12 U/L (ref 0–44)
AST: 17 U/L (ref 15–41)
Albumin: 3.3 g/dL — ABNORMAL LOW (ref 3.5–5.0)
Alkaline Phosphatase: 84 U/L (ref 38–126)
Anion gap: 6 (ref 5–15)
BUN: 10 mg/dL (ref 8–23)
CO2: 26 mmol/L (ref 22–32)
Calcium: 8.7 mg/dL — ABNORMAL LOW (ref 8.9–10.3)
Chloride: 107 mmol/L (ref 98–111)
Creatinine, Ser: 0.54 mg/dL (ref 0.44–1.00)
GFR calc Af Amer: >60 mL/min (ref >60)
GFR calc non Af Amer: >60 mL/min (ref >60)
Glucose, Bld: 116 mg/dL — ABNORMAL HIGH (ref 70–99)
Potassium: 3.7 mmol/L (ref 3.5–5.1)
Sodium: 139 mmol/L (ref 135–145)
Total Bilirubin: 0.9 mg/dL (ref 0.3–1.2)
Total Protein: 7.5 g/dL (ref 6.5–8.1)

## 2019-05-14 LAB — CBC
HCT: 25 % — ABNORMAL LOW (ref 36.0–46.0)
Hemoglobin: 7.2 g/dL — ABNORMAL LOW (ref 12.0–15.0)
MCH: 24.8 pg — ABNORMAL LOW (ref 26.0–34.0)
MCHC: 28.8 g/dL — ABNORMAL LOW (ref 30.0–36.0)
MCV: 86.2 fL (ref 80.0–100.0)
Platelets: 328 10*3/uL (ref 150–400)
RBC: 2.9 MIL/uL — ABNORMAL LOW (ref 3.87–5.11)
RDW: 18.3 % — ABNORMAL HIGH (ref 11.5–15.5)
WBC: 8.1 10*3/uL (ref 4.0–10.5)
nRBC: 0.4 % — ABNORMAL HIGH (ref 0.0–0.2)

## 2019-05-14 LAB — BRAIN NATRIURETIC PEPTIDE: B Natriuretic Peptide: 273.7 pg/mL — ABNORMAL HIGH (ref 0.0–100.0)

## 2019-05-14 LAB — TSH: TSH: 1.569 u[IU]/mL (ref 0.350–4.500)

## 2019-05-14 LAB — SARS CORONAVIRUS 2 (TAT 6-24 HRS): SARS Coronavirus 2: NEGATIVE

## 2019-05-14 MED ORDER — ZOLPIDEM TARTRATE 5 MG PO TABS
5.0000 mg | ORAL_TABLET | Freq: Every evening | ORAL | Status: DC | PRN
  Administered 2019-05-14: 5 mg via ORAL
  Filled 2019-05-14: qty 1

## 2019-05-14 MED ORDER — NICOTINE 14 MG/24HR TD PT24
14.0000 mg | MEDICATED_PATCH | Freq: Every day | TRANSDERMAL | Status: DC
  Administered 2019-05-14 – 2019-05-15 (×2): 14 mg via TRANSDERMAL
  Filled 2019-05-14 (×2): qty 1

## 2019-05-14 MED ORDER — FUROSEMIDE 40 MG PO TABS
40.0000 mg | ORAL_TABLET | Freq: Every day | ORAL | Status: DC
  Administered 2019-05-14 – 2019-05-15 (×2): 40 mg via ORAL
  Filled 2019-05-14 (×2): qty 1

## 2019-05-14 MED ORDER — ONDANSETRON HCL 4 MG PO TABS: 4.0000 mg | ORAL_TABLET | Freq: Four times a day (QID) | ORAL | Status: DC | PRN

## 2019-05-14 MED ORDER — ONDANSETRON HCL 4 MG/2ML IJ SOLN: 4.0000 mg | Freq: Four times a day (QID) | INTRAMUSCULAR | Status: DC | PRN

## 2019-05-14 MED ORDER — DULOXETINE HCL 60 MG PO CPEP
60.0000 mg | ORAL_CAPSULE | Freq: Every day | ORAL | Status: DC
  Filled 2019-05-14 (×2): qty 1

## 2019-05-14 MED ORDER — OXYCODONE-ACETAMINOPHEN 5-325 MG PO TABS
1.0000 | ORAL_TABLET | Freq: Four times a day (QID) | ORAL | Status: DC | PRN
  Administered 2019-05-14 – 2019-05-15 (×2): 1 via ORAL
  Filled 2019-05-14 (×2): qty 1

## 2019-05-14 MED ORDER — TRAMADOL HCL 50 MG PO TABS
50.0000 mg | ORAL_TABLET | Freq: Once | ORAL | Status: AC
  Administered 2019-05-14: 50 mg via ORAL
  Filled 2019-05-14: qty 1

## 2019-05-14 MED ORDER — SODIUM CHLORIDE 0.9 % IV SOLN
INTRAVENOUS | Status: DC
  Administered 2019-05-14: 06:00:00 via INTRAVENOUS

## 2019-05-14 MED ORDER — OXYCODONE-ACETAMINOPHEN 5-325 MG PO TABS
1.0000 | ORAL_TABLET | Freq: Every day | ORAL | Status: DC
  Administered 2019-05-14: 1 via ORAL
  Filled 2019-05-14: qty 1

## 2019-05-14 MED ORDER — METOPROLOL TARTRATE 50 MG PO TABS
50.0000 mg | ORAL_TABLET | Freq: Two times a day (BID) | ORAL | Status: DC
  Administered 2019-05-14 – 2019-05-15 (×4): 50 mg via ORAL
  Filled 2019-05-14 (×4): qty 1

## 2019-05-14 MED ORDER — PANTOPRAZOLE SODIUM 40 MG PO TBEC
40.0000 mg | DELAYED_RELEASE_TABLET | Freq: Two times a day (BID) | ORAL | Status: DC
  Administered 2019-05-14 – 2019-05-15 (×4): 40 mg via ORAL
  Filled 2019-05-14 (×4): qty 1

## 2019-05-14 MED ORDER — ASPIRIN EC 81 MG PO TBEC
81.0000 mg | DELAYED_RELEASE_TABLET | Freq: Every day | ORAL | Status: DC
  Administered 2019-05-14: 10:00:00 81 mg via ORAL
  Filled 2019-05-14: qty 1

## 2019-05-14 MED ORDER — L-LYSINE 500 MG PO TABS: ORAL_TABLET | Freq: Every day | ORAL | Status: DC

## 2019-05-14 MED ORDER — ALBUTEROL SULFATE HFA 108 (90 BASE) MCG/ACT IN AERS
2.0000 | INHALATION_SPRAY | RESPIRATORY_TRACT | Status: DC | PRN
  Filled 2019-05-14: qty 6.7

## 2019-05-14 MED ORDER — ALPRAZOLAM 0.25 MG PO TABS
0.2500 mg | ORAL_TABLET | Freq: Two times a day (BID) | ORAL | Status: DC | PRN
  Filled 2019-05-14: qty 1

## 2019-05-14 MED ORDER — OXYCODONE-ACETAMINOPHEN 5-325 MG PO TABS: 1.0000 | ORAL_TABLET | Freq: Four times a day (QID) | ORAL | Status: DC | PRN

## 2019-05-14 MED ORDER — AMMONIUM LACTATE 12 % EX LOTN
1.0000 "application " | TOPICAL_LOTION | CUTANEOUS | Status: DC | PRN
  Filled 2019-05-14: qty 225

## 2019-05-14 MED ORDER — SODIUM CHLORIDE 0.9 % IV SOLN
510.0000 mg | Freq: Once | INTRAVENOUS | Status: AC
  Administered 2019-05-14: 510 mg via INTRAVENOUS
  Filled 2019-05-14: qty 17

## 2019-05-14 MED ORDER — BIOTIN 1000 MCG PO TABS: 1000.0000 ug | ORAL_TABLET | Freq: Every day | ORAL | Status: DC

## 2019-05-14 MED ORDER — BENZONATATE 100 MG PO CAPS
100.0000 mg | ORAL_CAPSULE | Freq: Three times a day (TID) | ORAL | Status: DC | PRN
  Administered 2019-05-14: 100 mg via ORAL
  Filled 2019-05-14: qty 1

## 2019-05-14 MED ORDER — NITROGLYCERIN 0.4 MG SL SUBL: 0.4000 mg | SUBLINGUAL_TABLET | SUBLINGUAL | Status: DC | PRN

## 2019-05-14 NOTE — Progress Notes (Signed)
PROGRESS NOTE  Meredith Church [id number] DOB: November 12, 1955 DOA: 05/13/2019 PCP: Nolene Ebbs, MD   LOS: 1 day   Brief narrative: Meredith Church is a 63 y.o. female with medical history significant of COPD not on oxygen, hypertension, hyperlipidemia, depression with anxiety who presented with low hemoglobin.  Patient has been feeling bad in the last 2 weeks.  She feels so hot that she has been going to gas station and has been chewing ice literally all day.  She has no fever or chills no nausea vomiting or diarrhea.  Patient was seen by PCP and hemoglobin was around 4.  She denied any active bleed.  Denied any melena or bright red blood per rectum.  No excessive menstruation.  Patient has never had any history of anemia or blood transfusion.  She has felt very weak.  She has had numbness and tingling of her lower extremity as well.  Patient was seen in the ER with hemoglobin of 5.7.  Her MCV is 90.1 however.  She was admitted with symptomatic anemia.   Assessment/Plan:  Principal Problem:   Symptomatic anemia Active Problems:   Anxiety state   Essential hypertension, benign   Smoker   COPD (chronic obstructive pulmonary disease) (HCC)   Obese  symptomatic anemia with fatigue weakness dyspnea, peripheral edema: Severe iron deficiency anemia.  Unclear source of loss.  Hemoglobin has improved from 5.7 to  7.2 today.  Ferritin of 8.  Vitamin B12 within normal limits.  Serum iron of less than 5 and TIBC 524.  Status post 2 units of packed red blood cells.  I suspect nutritional anemia.  Patient denies any overt blood loss. Her MCV is 86.  We will give 1 dose of Feraheme today due to findings suggestive of dyspnea fatigue and peripheral edema on presentation.  Stool occult blood pending.  TSH within normal limits.  Discontinue aspirin for now.  She stated that she had endoscopy few months back which was essentially normal and had a colonoscopy last year in October as outpatient and had some  polyps at that time which were removed.  Could consider GI evaluation this time if she tests stool occult positive.  Mild hypoxia on supplemental oxygen with findings of peripheral edema on presentation.  This could represent congestive heart failure.  Will check 2D echocardiogram.  Check BNP.  Discontinue IV fluids.  Patient is a chronic smoker.  Counseled about it.  Continue on oxygen, inhalers as needed.  Add low-dose Lasix.  Units of packed RBC.  Add Feraheme today.  Check 2D echocardiogram.  Will get chest x-ray as well.  Essential hypertension: Blood pressure is reasonably controlled.  Continue metoprolol.  tobacco abuse: Continue nicotine patch.  Counseling was done.  anxiety disorder: Treat with Xanax.  VTE Prophylaxis: SCD.  Code Status: Full code  Family Communication: None  Disposition Plan: Home, will ambulate the patient today.  We will try to wean off oxygen as possible.  Likely disposition home in 1 to 2 days.  Lasix today.  Check BNP.  Check 2D echocardiogram and chest x-ray.  Consultants:  None  Procedures:  None  Antibiotics: Anti-infectives (From admission, onward)   None     Subjective: Patient still complains of mild dyspnea,fatigue weakness.  She states that her leg swelling is improved after Lasix.  Has not moved so does not really know how she feels.  Objective: Vitals:   05/14/19 0755 05/14/19 0800  BP: (!) 146/86 138/80  Pulse: 76 75  Resp: Marland Kitchen)  22 20  Temp: 98.3 F (36.8 C) 98.5 F (36.9 C)  SpO2: 99% 100%    Intake/Output Summary (Last 24 hours) at 05/14/2019 1008 Last data filed at 05/14/2019 0929 Gross per 24 hour  Intake 800 ml  Output -  Net 800 ml   Filed Weights   05/13/19 1910  Weight: 78.5 kg   Body mass index is 29.7 kg/m.   Physical Exam: GENERAL: Patient is alert awake and oriented. Not in obvious distress.  Overweight.  On nasal cannula HENT: Mild scleral pallor noted.  Pupils equally reactive to light. Oral mucosa  is moist NECK: is supple, no palpable thyroid enlargement. CHEST: Clear to auscultation.  Diminished breath sounds bilaterally  CVS: S1 and S2 heard, no murmur. Regular rate and rhythm. No pericardial rub. ABDOMEN: Soft, non-tender, bowel sounds are present. No palpable hepato-splenomegaly. EXTREMITIES: Trace peripheral edema CNS: Cranial nerves are intact. No focal motor or sensory deficits. SKIN: warm and dry without rashes.  Data Review: I have personally reviewed the following laboratory data and studies,  CBC: Recent Labs  Lab 05/13/19 2052 05/14/19 0828  WBC 10.6* 8.1  NEUTROABS 7.1  --   HGB 5.7* 7.2*  HCT 21.0* 25.0*  MCV 90.1 86.2  PLT 443* [id number]   Basic Metabolic Panel: Recent Labs  Lab 05/13/19 2052 05/14/19 0828  NA 139 139  K 3.7 3.7  CL 105 107  CO2 25 26  GLUCOSE 121* 116*  BUN 13 10  CREATININE 0.85 0.54  CALCIUM 9.7 8.7*   Liver Function Tests: Recent Labs  Lab 05/14/19 0828  AST 17  ALT 12  ALKPHOS 84  BILITOT 0.9  PROT 7.5  ALBUMIN 3.3*   No results for input(s): LIPASE, AMYLASE in the last 168 hours. No results for input(s): AMMONIA in the last 168 hours. Cardiac Enzymes: No results for input(s): CKTOTAL, CKMB, CKMBINDEX, TROPONINI in the last 168 hours. BNP (last 3 results) No results for input(s): BNP in the last 8760 hours.  ProBNP (last 3 results) No results for input(s): PROBNP in the last 8760 hours.  CBG: No results for input(s): GLUCAP in the last 168 hours. No results found for this or any previous visit (from the past 240 hour(s)).   Studies: No results found.  Scheduled Meds: . aspirin EC  81 mg Oral Daily  . DULoxetine  60 mg Oral Daily  . furosemide  40 mg Oral Daily  . metoprolol tartrate  50 mg Oral BID  . nicotine  14 mg Transdermal Daily  . oxyCODONE-acetaminophen  1 tablet Oral Daily  . pantoprazole  40 mg Oral BID    Continuous Infusions: . ferumoxytol       Flora Lipps, MD  [facility]  05/14/2019

## 2019-05-14 NOTE — Progress Notes (Signed)
Patient Had been yellow [place] since transfer  From ED. We started vitals based on Yellow mews.  0200 Patients 02 dropped to 83 while on Room air patient expressed that she felt anxious and just wanted to go back to sleep. Patient was placed on 3L of O2 and is now at 100%. Patient Is currently sleeping.  On Call Provider X blount was made aware of situation. No need orders.Will continue to monitor patient.

## 2019-05-14 NOTE — Progress Notes (Signed)
   05/13/19 2355  Vitals  Temp 98.7 F (37.1 C)  Temp Source Oral  BP (!) 147/73  MAP (mmHg) 95  BP Location Left Arm  BP Method Automatic  Patient Position (if appropriate) Sitting  Pulse Rate 93  Pulse Rate Source Monitor  Resp (!) 28 (just moved over from gurney)  Oxygen Therapy  SpO2 94 %  O2 Device Room Air  MEWS Score  MEWS RR 2  MEWS Pulse 0  MEWS Systolic 0  MEWS LOC 0  MEWS Temp 0  MEWS Score 2  MEWS Score Color Yellow  MEWS Assessment  Is this an acute change? Yes  MEWS guidelines implemented *See Row Information* Yellow

## 2019-05-15 ENCOUNTER — Inpatient Hospital Stay (HOSPITAL_COMMUNITY): Payer: Medicare Other

## 2019-05-15 LAB — HIV ANTIBODY (ROUTINE TESTING W REFLEX): HIV Screen 4th Generation wRfx: NONREACTIVE

## 2019-05-15 LAB — BASIC METABOLIC PANEL
Anion gap: 7 (ref 5–15)
BUN: 9 mg/dL (ref 8–23)
CO2: 28 mmol/L (ref 22–32)
Calcium: 8.9 mg/dL (ref 8.9–10.3)
Chloride: 106 mmol/L (ref 98–111)
Creatinine, Ser: 0.63 mg/dL (ref 0.44–1.00)
GFR calc Af Amer: >60 mL/min (ref >60)
GFR calc non Af Amer: >60 mL/min (ref >60)
Glucose, Bld: 105 mg/dL — ABNORMAL HIGH (ref 70–99)
Potassium: 3.4 mmol/L — ABNORMAL LOW (ref 3.5–5.1)
Sodium: 141 mmol/L (ref 135–145)

## 2019-05-15 LAB — CBC
HCT: 26.1 % — ABNORMAL LOW (ref 36.0–46.0)
Hemoglobin: 7.4 g/dL — ABNORMAL LOW (ref 12.0–15.0)
MCH: 24.6 pg — ABNORMAL LOW (ref 26.0–34.0)
MCHC: 28.4 g/dL — ABNORMAL LOW (ref 30.0–36.0)
MCV: 86.7 fL (ref 80.0–100.0)
Platelets: 341 10*3/uL (ref 150–400)
RBC: 3.01 MIL/uL — ABNORMAL LOW (ref 3.87–5.11)
RDW: 18.2 % — ABNORMAL HIGH (ref 11.5–15.5)
WBC: 8.4 10*3/uL (ref 4.0–10.5)
nRBC: 0.6 % — ABNORMAL HIGH (ref 0.0–0.2)

## 2019-05-15 MED ORDER — VITAMIN C 250 MG PO TABS: 250.0000 mg | ORAL_TABLET | Freq: Every day | ORAL | 3 refills | Status: AC

## 2019-05-15 MED ORDER — FUROSEMIDE 40 MG PO TABS: 40.0000 mg | ORAL_TABLET | Freq: Every day | ORAL | Status: DC

## 2019-05-15 MED ORDER — NICOTINE 14 MG/24HR TD PT24: 14.0000 mg | MEDICATED_PATCH | Freq: Every day | TRANSDERMAL | 0 refills | Status: AC

## 2019-05-15 MED ORDER — POTASSIUM CHLORIDE CRYS ER 20 MEQ PO TBCR
40.0000 meq | EXTENDED_RELEASE_TABLET | Freq: Once | ORAL | Status: AC
  Administered 2019-05-15: 40 meq via ORAL
  Filled 2019-05-15: qty 2

## 2019-05-15 MED ORDER — FERROUS SULFATE 325 (65 FE) MG PO TABS: 325.0000 mg | ORAL_TABLET | Freq: Two times a day (BID) | ORAL | 2 refills | Status: DC

## 2019-05-15 NOTE — Plan of Care (Signed)

## 2019-05-15 NOTE — Progress Notes (Signed)
Pt discharged from the unit via wheelchair. Discharge instructions were reviewed with the pt at bedside. No questions or concerns at this time. 

## 2019-05-15 NOTE — Discharge Summary (Signed)
Physician Discharge Summary  Meredith Church [id number] DOB: 05/13/1956 DOA: 05/13/2019  PCP: Nolene Ebbs, MD  Admit date: 05/13/2019 Discharge date: 05/15/2019  Admitted From: Home  Discharge disposition: Home   Recommendations for Outpatient Follow-Up:   Follow up with your primary care provider in one week.  Follow-up with your GI doctor in 1 to 2 weeks.   Discharge Diagnosis:   Principal Problem:   Symptomatic anemia Active Problems:   Anxiety state   Essential hypertension, benign   Smoker   COPD (chronic obstructive pulmonary disease) ([place])   Obese    Discharge Condition: Improved.  Diet recommendation: Low sodium, heart healthy.    Wound care: None.  Code status: Full.   History of Present Illness:   Meredith Church a 63 y.o.femalewith medical history significant ofCOPD not on oxygen, hypertension, hyperlipidemia, depression with anxiety who presented with low hemoglobin. Patient has been feeling bad in the last 2 weeks. She feels so hotthat she has been going to gas station and has been chewing ice literally all day. She has no fever or chills no nausea vomiting or diarrhea. Patient was seen by PCP and hemoglobin was around 4. She denied any active bleed. Denied any melena or bright red blood per rectum. No excessive menstruation. Patient has never had any history of anemia or blood transfusion. She has felt very weak. She has had numbness and tingling of her lower extremity as well. Patient was seen in the ER with hemoglobin of 5.7. Her MCV is 90.1 however. She was admitted with symptomatic anemia.    Hospital Course:   Following conditions were addressed during hospitalization,  symptomatic anemia with fatigue weakness dyspnea, peripheral edema:Improved.  Severe iron deficiency anemia.  Unclear source of loss.  Hemoglobin has improved from 5.7 to 7.2>7.4 today.  Ferritin of 8.  Vitamin B12 within normal limits.  Serum iron of less  than 5 and TIBC 524.  Status post 2 units of packed red blood cells.   Patient denies any overt blood loss.Her MCV is 86.  Received1 dose of Feraheme 05/13/29.  Stool occult blood pending.  TSH within normal limits.  Discontinue aspirin for now.  Consider at a later date if it is absolutely necessary as per PCP discretion. She stated that she had endoscopy few months back which was essentially normal and had a colonoscopy last year in October as outpatient with Dr Ardis Hughs and had some polyps at that time which were removed.Consider GI evaluation with possible capsule endoscopy as outpatient.  I have discussed with the patient about it in detail.  Mild hypoxia on supplemental oxygen with findings of peripheral edema on presentation.  This could represent congestive heart failure.  2D echocardiogram pending but the patient did have a 2D echocardiogram as outpatient recently.  Patient states to me that as her echocardiogram and stress test were normal.  She was recently started on low-dose Lasix as outpatient.Marland Kitchen    BNP was slightly elevated.  Patient was resumed on Lasix while in the hospital. Patient is a chronic smoker.  Counseled about it.  This x-ray was reviewed with right basilar atelectasis.  Essential hypertension:Blood pressure is reasonably controlled.  Continue metoprolol on discharge.  tobacco abuse: Continue nicotine patch.  Counseling was done.  anxiety disorder:Continue Xanax.  Disposition: At this time, patient has clinically improved and ambulated.  She has felt much better.  She is stable for disposition home with outpatient follow-up with PCP to check iron levels CBC in 1 week.  She will also have to follow-up with her GI doctor Dr. Ardis Hughs as outpatient for consideration of capsule endoscopy   Medical Consultants:    None.   Subjective:   Today, patient feels much better.  Wants to go home.  Has ambulated in the hallway yesterday.  Denies any dizziness, chest pain,  palpitation.  No bowel movement yet.  Discharge Exam:   Vitals:   05/14/19 1957 05/15/19 0433  BP: 136/80 125/68  Pulse: 85 80  Resp: 18 16  Temp: 98.8 F (37.1 C) 98.5 F (36.9 C)  SpO2: 97% 95%   Vitals:   05/14/19 1331 05/14/19 1710 05/14/19 1957 05/15/19 0433  BP: 129/77 125/69 136/80 125/68  Pulse: 67 75 85 80  Resp: (!) 22 (!) 22 18 16   Temp: 97.6 F (36.4 C) 98.3 F (36.8 C) 98.8 F (37.1 C) 98.5 F (36.9 C)  TempSrc: Oral Oral Oral Oral  SpO2: 92% 92% 97% 95%  Weight:    78.9 kg  Height:        General exam: Appears calm and comfortable ,Not in distress HEENT:PERRL,Oral mucosa moist.  Mild pallor noted. Respiratory system: Bilateral equal air entry, normal vesicular breath sounds, no wheezes or crackles  Cardiovascular system: S1 & S2 heard, RRR.  Gastrointestinal system: Abdomen is nondistended, soft and nontender. No organomegaly or masses felt. Normal bowel sounds heard. Central nervous system: Alert and oriented. No focal neurological deficits. Extremities: No edema, no clubbing ,no cyanosis, distal peripheral pulses palpable. Skin: No rashes, lesions or ulcers,no icterus ,no pallor MSK: Normal muscle bulk,tone ,power    Procedures:    None  The results of significant diagnostics from this hospitalization (including imaging, microbiology, ancillary and laboratory) are listed below for reference.     Diagnostic Studies:   Dg Chest Port 1 View  Result Date: 05/14/2019 CLINICAL DATA:  Dyspnea.  Weakness. EXAM: PORTABLE CHEST 1 VIEW COMPARISON:  Chest x-ray dated September 26, 2014. FINDINGS: The heart size and mediastinal contours are within normal limits. Atherosclerotic calcification of the aortic arch. Normal pulmonary vascularity. Small opacity at the right lung base. No pleural effusion or pneumothorax. No acute osseous abnormality. IMPRESSION: 1. Right basilar atelectasis versus infiltrate. Electronically Signed   By: Titus Dubin M.D.   On:  05/14/2019 12:37     Labs:   Basic Metabolic Panel: Recent Labs  Lab 05/13/19 2052 05/14/19 0828 05/15/19 0458  NA 139 139 141  K 3.7 3.7 3.4*  CL 105 107 106  CO2 25 26 28   GLUCOSE 121* 116* 105*  BUN 13 10 9   CREATININE 0.85 0.54 0.63  CALCIUM 9.7 8.7* 8.9   GFR Estimated Creatinine Clearance: 71.3 mL/min (by C-G formula based on SCr of 0.63 mg/dL). Liver Function Tests: Recent Labs  Lab 05/14/19 0828  AST 17  ALT 12  ALKPHOS 84  BILITOT 0.9  PROT 7.5  ALBUMIN 3.3*   No results for input(s): LIPASE, AMYLASE in the last 168 hours. No results for input(s): AMMONIA in the last 168 hours. Coagulation profile No results for input(s): INR, PROTIME in the last 168 hours.  CBC: Recent Labs  Lab 05/13/19 2052 05/14/19 0828 05/15/19 0458  WBC 10.6* 8.1 8.4  NEUTROABS 7.1  --   --   HGB 5.7* 7.2* 7.4*  HCT 21.0* 25.0* 26.1*  MCV 90.1 86.2 86.7  PLT 443* 328 341   Cardiac Enzymes: No results for input(s): CKTOTAL, CKMB, CKMBINDEX, TROPONINI in the last 168 hours. BNP: Invalid input(s): POCBNP CBG:  No results for input(s): GLUCAP in the last 168 hours. D-Dimer No results for input(s): DDIMER in the last 72 hours. Hgb A1c No results for input(s): HGBA1C in the last 72 hours. Lipid Profile No results for input(s): CHOL, HDL, LDLCALC, TRIG, CHOLHDL, LDLDIRECT in the last 72 hours. Thyroid function studies Recent Labs    05/14/19 0828  TSH 1.569   Anemia work up Recent Labs    05/13/19 2209  VITAMINB12 247  FOLATE 11.0  FERRITIN 8*  TIBC 524*  IRON <5*  RETICCTPCT 3.0   Microbiology Recent Results (from the past 240 hour(s))  SARS CORONAVIRUS 2 (TAT 6-12 HRS) Nasal Swab Aptima Multi Swab     Status: None   Collection Time: 05/13/19 11:29 PM   Specimen: Aptima Multi Swab; Nasal Swab  Result Value Ref Range Status   SARS Coronavirus 2 NEGATIVE NEGATIVE Final    Comment: (NOTE) SARS-CoV-2 target nucleic acids are NOT DETECTED. The SARS-CoV-2 RNA  is generally detectable in upper and lower respiratory specimens during the acute phase of infection. Negative results do not preclude SARS-CoV-2 infection, do not rule out co-infections with other pathogens, and should not be used as the sole basis for treatment or other patient management decisions. Negative results must be combined with clinical observations, patient history, and epidemiological information. The expected result is Negative. Fact Sheet for Patients: SugarRoll.be Fact Sheet for Healthcare Providers: https://www.woods-mathews.com/ This test is not yet approved or cleared by the [place] FDA and  has been authorized for detection and/or diagnosis of SARS-CoV-2 by FDA under an Emergency Use Authorization (EUA). This EUA will remain  in effect (meaning this test can be used) for the duration of the COVID-19 declaration under Section 56 4(b)(1) of the Act, 21 U.S.C. section 360bbb-3(b)(1), unless the authorization is terminated or revoked sooner. Performed at [facility], [place] [address], [place], [place] [address]      Discharge Instructions:   Discharge Instructions    Call MD for:   Complete by: As directed    Worsening symptoms including shortness of breath, dizziness, chest pain   Diet - low sodium heart healthy   Complete by: As directed    Discharge instructions   Complete by: As directed    Please do not smoke.  You have been prescribed nicotine patch to help you quit smoking.  Continue to take iron supplements.  Please follow-up with your primary care provider within 1 week.  Follow-up with your GI doctor Dr. Ardis Hughs within 1 to 2 weeks to discuss about low blood counts and possible need of capsule endoscopy.  Do not take aspirin until the next doctor's visit.   Increase activity slowly   Complete by: As directed      Allergies as of 05/15/2019      Reactions   Other Other (See Comments)   Nickel  "breaks my skin out" Fire ants      Medication List    STOP taking these medications   aspirin EC 81 MG tablet   fluconazole 150 MG tablet Commonly known as: DIFLUCAN   metroNIDAZOLE 0.75 % vaginal gel Commonly known as: METROGEL     TAKE these medications   albuterol 108 (90 Base) MCG/ACT inhaler Commonly known as: VENTOLIN HFA Inhale 2 puffs into the lungs every 6 (six) hours as needed for wheezing or shortness of breath.   ALPRAZolam 0.25 MG tablet Commonly known as: Xanax Take 1 tablet (0.25 mg total) by mouth 2 (two) times daily  as needed for anxiety.   amLODipine 10 MG tablet Commonly known as: NORVASC Take 1 tablet (10 mg total) by mouth daily.   ammonium lactate 12 % cream Commonly known as: Lac-Hydrin Apply topically as needed for dry skin.   benzonatate 100 MG capsule Commonly known as: TESSALON Take 100 mg by mouth every 8 (eight) hours as needed for cough.   Biotin 1000 MCG tablet Take 1,000 mcg by mouth daily.   Cane Misc 1 each by Does not apply route daily.   [employer] Basic Misc 1 each by Does not apply route daily.   DULoxetine 60 MG capsule Commonly known as: CYMBALTA 1 qam What changed:   how much to take  how to take this  when to take this   ferrous sulfate 325 (65 FE) MG tablet Take 1 tablet (325 mg total) by mouth 2 (two) times daily after a meal.   furosemide 40 MG tablet Commonly known as: LASIX Take 1 tablet (40 mg total) by mouth daily. Start taking on: May 16, 2019   hydroquinone 4 % cream Apply topically 2 (two) times daily. To dark areas of face. Use of SPF 30 or 50 sunscreen moisturizer   L-LYSINE PO Take 1 tablet by mouth daily.   metoprolol tartrate 50 MG tablet Commonly known as: LOPRESSOR Take 1 tablet (50 mg total) by mouth 2 (two) times daily.   nicotine 14 mg/24hr patch Commonly known as: NICODERM CQ - dosed in mg/24 hours Place 1 patch (14 mg total) onto the skin daily.   nitroGLYCERIN  0.4 MG SL tablet Commonly known as: NITROSTAT See admin instructions.   oxyCODONE-acetaminophen 5-325 MG tablet Commonly known as: PERCOCET/ROXICET Take 1 tablet by mouth daily. None after 6 PM.   pantoprazole 40 MG tablet Commonly known as: PROTONIX Take 40 mg by mouth 2 (two) times daily.   triamcinolone ointment 0.5 % Commonly known as: KENALOG APPLY 1 APPLICATION TOPICALLY 2 TIMES DAILY. APPLY TO HANDS   vitamin C 250 MG tablet Commonly known as: ASCORBIC ACID Take 1 tablet (250 mg total) by mouth daily.         Time coordinating discharge: 39 minutes  Signed:  Domanique Luckett  [facility] 05/15/2019, 9:16 AM

## 2019-05-16 LAB — BPAM RBC
Blood Product Expiration Date: 202009222359
Blood Product Expiration Date: 202009222359
ISSUE DATE / TIME: 202008290015
ISSUE DATE / TIME: 202008290317
Unit Type and Rh: 6200
Unit Type and Rh: 6200

## 2019-05-16 LAB — TYPE AND SCREEN
ABO/RH(D): A POS
Antibody Screen: NEGATIVE
Unit division: 0
Unit division: 0

## 2019-05-16 NOTE — ED Provider Notes (Addendum)
[facility] Provider Note   CSN: [id number] Arrival date & time: 05/13/19  1906     History   Chief Complaint Chief Complaint  Patient presents with  . Abnormal Lab    HPI Meredith Church is a 63 y.o. female.  HPI   63 year old female with symptomatic anemia.  Outpatient blood work showed hemoglobin in the fours.  Denies any overt bleeding.  No melena.  Patient developed new habit of ice eating over the past several weeks.  She reports that she has consumed approximate 16 pounds of ice in the past 3 days.  She is not anticoagulated.  Reports feeling intermittently dizzy and short of breath with exertion. Past Medical History:  Diagnosis Date  . Anxiety Dx 2007  . Arthritis   . COPD (chronic obstructive pulmonary disease) ([place])   . Eczema   . Hyperlipidemia Dx 2010  . Hypertension Dx 2010  . Numbness and tingling of lower extremity     Patient Active Problem List   Diagnosis Date Noted  . Symptomatic anemia 05/13/2019  . Postinflammatory hyperpigmentation 04/13/2017  . Obese 02/25/2017  . S/P right THA, AA 02/24/2017  . COPD (chronic obstructive pulmonary disease) ([place]) 01/22/2017  . Primary osteoarthritis of right hip 11/03/2016  . Lumbosacral spondylosis with radiculopathy 07/03/2016  . 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 Positive D dimer 07/13/2015  . Xerosis of skin 05/30/2015  . Chronic radicular pain of lower back 05/07/2015  . 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 Sprain, strain 11/16/2014  . Smoker 07/27/2014  . Anxiety state 01/11/2014  . Essential hypertension, benign 01/11/2014    Past Surgical History:  Procedure Laterality Date  . ABDOMINAL HYSTERECTOMY    . FRACTURE SURGERY     Left pinky finger  . LIPOMA EXCISION    . LUMBAR LAMINECTOMY/DECOMPRESSION MICRODISCECTOMY N/A 07/03/2016   Procedure: LUMBAR FOUR - LUMBAR FIVE LAMINECTOMY/DISKECTOMY;   Surgeon: [name] [name], MD;  Location: [place];  Service: Neurosurgery;  Laterality: N/A;  LUMBAR FOUR - LUMBAR FIVE LAMINECTOMY/DISKECTOMY  . SALIVARY GLAND SURGERY    . sciatica     Dr. Sharmaine Base oct. 2017  . TIBIA FRACTURE SURGERY     right  . TOTAL HIP ARTHROPLASTY Right 02/24/2017   Procedure: RIGHT TOTAL HIP ARTHROPLASTY ANTERIOR APPROACH;  Surgeon: Paralee Cancel, MD;  Location: [facility];  Service: Orthopedics;  Laterality: Right;     OB History    Gravida  2   Para  2   Term  2   Preterm      AB      Living  2     SAB      TAB      Ectopic      Multiple      Live Births  2            Home Medications    Prior to Admission medications   Medication Sig Start Date End Date Taking? Authorizing Provider  ALPRAZolam (XANAX) 0.25 MG tablet Take 1 tablet (0.25 mg total) by mouth 2 (two) times daily as needed for anxiety. 05/04/19 05/03/20 Yes Plovsky, Berneta Sages, MD  ammonium lactate (LAC-HYDRIN) 12 % cream Apply topically as needed for dry skin. 06/10/17  Yes Charlott Rakes, MD  benzonatate (TESSALON) 100 MG capsule Take 100 mg by mouth every 8 (eight) hours as needed for cough.  04/07/19  Yes [provider]  Biotin 1000 MCG tablet  Take 1,000 mcg by mouth daily.    Yes [provider]  DULoxetine (CYMBALTA) 60 MG capsule 1 qam Patient taking differently: Take 60 mg by mouth daily. 1 qam 05/04/19  Yes Plovsky, Berneta Sages, MD  L-LYSINE PO Take 1 tablet by mouth daily.    Yes [provider]  metoprolol tartrate (LOPRESSOR) 50 MG tablet Take 1 tablet (50 mg total) by mouth 2 (two) times daily. 06/10/17  Yes Charl[facility] Rakes, MD  nitroGLYCERIN (NITROSTAT) 0.4 MG SL tablet See admin instructions. 02/02/19  Yes [provider]  oxyCODONE-acetaminophen (PERCOCET/ROXICET) 5-325 MG tablet Take 1 tablet by mouth daily. None after 6 PM. 04/11/19  Yes [provider]  pantoprazole (PROTONIX) 40 MG tablet Take 40 mg by mouth 2 (two) times daily.  03/25/19  Yes [provider]  triamcinolone ointment (KENALOG) 0.5 % APPLY 1 APPLICATION TOPICALLY 2 TIMES DAILY. APPLY TO HANDS 04/17/17  Yes Funches, Josalyn, MD  albuterol (PROVENTIL HFA;VENTOLIN HFA) 108 (90 Base) MCG/ACT inhaler Inhale 2 puffs into the lungs every 6 (six) hours as needed for wheezing or shortness of breath. Patient not taking: Reported on 05/13/2019 06/10/17   [place] Rakes, MD  amLODipine (NORVASC) 10 MG tablet Take 1 tablet (10 mg total) by mouth daily. Patient not taking: Reported on 05/13/2019 03/11/18   [place] Rakes, MD  ferrous sulfate 325 (65 FE) MG tablet Take 1 tablet (325 mg total) by mouth 2 (two) times daily after a meal. 05/15/19   Pokhrel, Laxman, MD  furosemide (LASIX) 40 MG tablet Take 1 tablet (40 mg total) by mouth daily. 05/16/19   Pokhrel, Corrie Mckusick, MD  hydroquinone 4 % cream Apply topically 2 (two) times daily. To dark areas of face. Use of SPF 30 or 50 sunscreen moisturizer Patient not taking: Reported on 05/13/2019 04/13/17   Boykin Nearing, MD  Misc. Devices (CANE) MISC 1 each by Does not apply route daily. 11/02/15   Boykin Nearing, MD  Misc. Devices (HUGO ROLLING WALKER BASIC) MISC 1 each by Does not apply route daily. 11/14/16   Funches, Adriana Mccallum, MD  nicotine (NICODERM CQ - DOSED IN MG/24 HOURS) 14 mg/24hr patch Place 1 patch (14 mg total) onto the skin daily. 05/15/19   Pokhrel, Corrie Mckusick, MD  vitamin C (ASCORBIC ACID) 250 MG tablet Take 1 tablet (250 mg total) by mouth daily. 05/15/19   Pokhrel, Corrie Mckusick, MD  pravastatin (PRAVACHOL) 40 MG tablet Take 1 tablet (40 mg total) by mouth daily. 10/03/13 01/30/14  Tresa Garter, MD    Family History Family History  Problem Relation Age of Onset  . Diabetes Mother   . Diabetes Sister   . Cancer Brother        colon cancer     Social History Social History   Tobacco Use  . Smoking status: Current Some Day Smoker    Packs/day: 0.20    Years: 30.00    Pack years: 6.00    Types: Cigarettes  .  Smokeless tobacco: Never Used  . Tobacco comment: Wears Nicotine patch   Substance Use Topics  . Alcohol use: Yes    Alcohol/week: 0.0 standard drinks    Comment: social  . Drug use: No     Allergies   Other   Review of Systems Review of Systems  All systems reviewed and negative, other than as noted in HPI.  Physical Exam Updated Vital Signs BP 125/68 (BP Location: Right Arm)   Pulse 80   Temp 98.5 F (36.9 C) (Oral)  Resp 16   Ht 5\' 4"  (1.626 m)   Wt 78.9 kg   SpO2 95%   BMI 29.86 kg/m   Physical Exam Vitals signs and nursing note reviewed.  Constitutional:      General: She is not in acute distress.    Appearance: She is well-developed.  HENT:     Head: Normocephalic and atraumatic.  Eyes:     General:        Right eye: No discharge.        Left eye: No discharge.     Conjunctiva/sclera: Conjunctivae normal.  Neck:     Musculoskeletal: Neck supple.  Cardiovascular:     Rate and Rhythm: Normal rate and regular rhythm.     Heart sounds: Normal heart sounds. No murmur. No friction rub. No gallop.   Pulmonary:     Effort: Pulmonary effort is normal. No respiratory distress.     Breath sounds: Normal breath sounds.  Abdominal:     General: There is no distension.     Palpations: Abdomen is soft.     Tenderness: There is no abdominal tenderness.  Musculoskeletal:        General: No tenderness.  Skin:    General: Skin is warm and dry.  Neurological:     Mental Status: She is alert.  Psychiatric:        Behavior: Behavior normal.        Thought Content: Thought content normal.    ED Treatments / Results  Labs (all labs ordered are listed, but only abnormal results are displayed) Labs Reviewed  CBC WITH DIFFERENTIAL/PLATELET - Abnormal; Notable for the following components:      Result Value   WBC 10.6 (*)    RBC 2.33 (*)    Hemoglobin 5.7 (*)    HCT 21.0 (*)    MCH 24.5 (*)    MCHC 27.1 (*)    RDW 19.1 (*)    Platelets 443 (*)    nRBC 0.8  (*)    All other components within normal limits  BASIC METABOLIC PANEL - Abnormal; Notable for the following components:   Glucose, Bld 121 (*)    All other components within normal limits  IRON AND TIBC - Abnormal; Notable for the following components:   Iron <5 (*)    TIBC 524 (*)    Saturation Ratios 0 (*)    All other components within normal limits  FERRITIN - Abnormal; Notable for the following components:   Ferritin 8 (*)    All other components within normal limits  RETICULOCYTES - Abnormal; Notable for the following components:   RBC. 2.35 (*)    Immature Retic Fract 40.8 (*)    All other components within normal limits  CBC - Abnormal; Notable for the following components:   RBC 2.90 (*)    Hemoglobin 7.2 (*)    HCT 25.0 (*)    MCH 24.8 (*)    MCHC 28.8 (*)    RDW 18.3 (*)    nRBC 0.4 (*)    All other components within normal limits  COMPREHENSIVE METABOLIC PANEL - Abnormal; Notable for the following components:   Glucose, Bld 116 (*)    Calcium 8.7 (*)    Albumin 3.3 (*)    All other components within normal limits  BRAIN NATRIURETIC PEPTIDE - Abnormal; Notable for the following components:   B Natriuretic Peptide 273.7 (*)    All other components within normal limits  BASIC METABOLIC PANEL -  Abnormal; Notable for the following components:   Potassium 3.4 (*)    Glucose, Bld 105 (*)    All other components within normal limits  CBC - Abnormal; Notable for the following components:   RBC 3.01 (*)    Hemoglobin 7.4 (*)    HCT 26.1 (*)    MCH 24.6 (*)    MCHC 28.4 (*)    RDW 18.2 (*)    nRBC 0.6 (*)    All other components within normal limits  SARS CORONAVIRUS 2 (TAT 6-12 HRS)  VITAMIN B12  FOLATE  HIV ANTIBODY (ROUTINE TESTING W REFLEX)  TSH  TYPE AND SCREEN  PREPARE RBC (CROSSMATCH)   EKG EKG Interpretation  Date/Time:  Friday May 13 2019 19:51:18 EDT Ventricular Rate:  90 PR Interval:    QRS Duration: 95 QT Interval:  366 QTC Calculation:  448 R Axis:   81 Text Interpretation:  Sinus rhythm Atrial premature complex Borderline right axis deviation Probable left ventricular hypertrophy Confirmed by Julianne Rice [phone]) on 05/14/2019 4:43:26 PM  Radiology No results found.  Procedures Procedures (including critical care time)  CRITICAL CARE Performed by: Virgel Manifold Total critical care time: 35 minutes Critical care time was exclusive of separately billable procedures and treating other patients. Critical care was necessary to treat or prevent imminent or life-threatening deterioration. Critical care was time spent personally by me on the following activities: development of treatment plan with patient and/or surrogate as well as nursing, discussions with consultants, evaluation of patient's response to treatment, examination of patient, obtaining history from patient or surrogate, ordering and performing treatments and interventions, ordering and review of laboratory studies, ordering and review of radiographic studies, pulse oximetry and re-evaluation of patient's condition.   Medications Ordered in ED Medications  0.9 %  sodium chloride infusion (10 mL/hr Intravenous New Bag/Given 05/13/19 2200)  LORazepam (ATIVAN) injection 1 mg (1 mg Intravenous Given 05/13/19 2159)  traMADol (ULTRAM) tablet 50 mg (50 mg Oral Given 05/14/19 0619)  ferumoxytol (FERAHEME) 510 mg in sodium chloride 0.9 % 100 mL IVPB (510 mg Intravenous New Bag/Given 05/14/19 1122)  potassium chloride SA (K-DUR) CR tablet 40 mEq (40 mEq Oral Given 05/15/19 0902)     Initial Impression / Assessment and Plan / ED Course  I have reviewed the triage vital signs and the nursing notes.  Pertinent labs & imaging results that were available during my care of the patient were reviewed by me and considered in my medical decision making (see chart for details).        63 year old female with symptomatic anemia.  Will check anemia panel.  Transfuse packed red  blood cells.  Admission.  Final Clinical Impressions(s) / ED Diagnoses   Final diagnoses:  Symptomatic anemia    ED Discharge Orders         Ordered    furosemide (LASIX) 40 MG tablet  Daily     05/15/19 0916    nicotine (NICODERM CQ - DOSED IN MG/24 HOURS) 14 mg/24hr patch  Daily     05/15/19 0916    vitamin C (ASCORBIC ACID) 250 MG tablet  Daily     05/15/19 0916    ferrous sulfate 325 (65 FE) MG tablet  2 times daily after meals     05/15/19 0916    Increase activity slowly     05/15/19 0916    Diet - low sodium heart healthy     05/15/19 0916    Discharge instructions    Comments:  Please do not smoke.  You have been prescribed nicotine patch to help you quit smoking.  Continue to take iron supplements.  Please follow-up with your primary care provider within 1 week.  Follow-up with your GI doctor Dr. Ardis Hughs within 1 to 2 weeks to discuss about low blood counts and possible need of capsule endoscopy.  Do not take aspirin until the next doctor's visit.   05/15/19 [id number]    Call MD for:    Comments: Worsening symptoms including shortness of breath, dizziness, chest pain   05/15/19 0916           Virgel Manifold, MD 05/16/19 1216    Virgel Manifold, MD 05/31/19 [phone]

## 2019-05-31 ENCOUNTER — Other Ambulatory Visit (INDEPENDENT_AMBULATORY_CARE_PROVIDER_SITE_OTHER): Payer: Medicare Other

## 2019-05-31 ENCOUNTER — Encounter: Payer: Self-pay | Admitting: Physician Assistant

## 2019-05-31 ENCOUNTER — Ambulatory Visit (INDEPENDENT_AMBULATORY_CARE_PROVIDER_SITE_OTHER): Payer: Medicare Other | Admitting: Physician Assistant

## 2019-05-31 ENCOUNTER — Other Ambulatory Visit: Payer: Self-pay

## 2019-05-31 VITALS — BP 120/76 | HR 84 | Temp 98.1°F | Ht 64.0 in | Wt 170.0 lb

## 2019-05-31 DIAGNOSIS — D649 Anemia, unspecified: Secondary | ICD-10-CM | POA: Diagnosis not present

## 2019-05-31 LAB — IBC + FERRITIN
Ferritin: 136.3 ng/mL (ref 10.0–291.0)
Iron: 355 ug/dL — ABNORMAL HIGH (ref 42–145)
Saturation Ratios: 101.4 % — ABNORMAL HIGH (ref 20.0–50.0)
Transferrin: 250 mg/dL (ref 212.0–360.0)

## 2019-05-31 LAB — CBC WITH DIFFERENTIAL/PLATELET
Basophils Absolute: 0.1 10*3/uL (ref 0.0–0.1)
Basophils Relative: 1.1 % (ref 0.0–3.0)
Eosinophils Absolute: 0.2 10*3/uL (ref 0.0–0.7)
Eosinophils Relative: 2.4 % (ref 0.0–5.0)
HCT: 39.4 % (ref 36.0–46.0)
Hemoglobin: 12.2 g/dL (ref 12.0–15.0)
Lymphocytes Relative: 32.2 % (ref 12.0–46.0)
Lymphs Abs: 2.2 10*3/uL (ref 0.7–4.0)
MCHC: 31 g/dL (ref 30.0–36.0)
MCV: 92.1 fl (ref 78.0–100.0)
Monocytes Absolute: 0.5 10*3/uL (ref 0.1–1.0)
Monocytes Relative: 7.6 % (ref 3.0–12.0)
Neutro Abs: 3.9 10*3/uL (ref 1.4–7.7)
Neutrophils Relative %: 56.7 % (ref 43.0–77.0)
Platelets: 369 10*3/uL (ref 150.0–400.0)
RBC: 4.27 Mil/uL (ref 3.87–5.11)
RDW: 29 % — ABNORMAL HIGH (ref 11.5–15.5)
WBC: 6.8 10*3/uL (ref 4.0–10.5)

## 2019-05-31 MED ORDER — PEG 3350-KCL-NA BICARB-NACL 420 G PO SOLR: 4000.0000 mL | Freq: Once | ORAL | 0 refills | Status: AC

## 2019-05-31 NOTE — Patient Instructions (Addendum)
Your provider has requested that you go to the basement level for lab work before leaving today. Press "B" on the elevator. The lab is located at the first door on the left as you exit the elevator.  You have been scheduled for a colonoscopy. Please follow written instructions given to you at your visit today.  Please pick up your prep supplies at the pharmacy within the next 1-3 days. If you use inhalers (even only as needed), please bring them with you on the day of your procedure.  You have been scheduled for IV Iron- Faraheme at [facility] [place]( #[id number]) . On 06/13/2019 at 8:30am   Thank you for choosing me and [facility].  Meredith Church

## 2019-05-31 NOTE — Progress Notes (Signed)
I agree with the above note, plan 

## 2019-05-31 NOTE — Progress Notes (Signed)
Chief Complaint: Anemia  HPI:    Meredith Church is a 63 year old African-American female with a past medical history of COPD and others listed below, known to Dr. Ardis Hughs, who was referred to me by Nolene Ebbs, MD for a complaint of anemia.      11/18/2013 colonoscopy with 2 polyps and diverticulum in the left colonWith 2 polyps and diverticulum in the left colon.  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  Repeat recommended in 10 years.    02/15/2019 EGD with mild gastritis and otherwise normal.  Biopsy shows benign mucosa with intestinal metaplasia.    05/13/2019-05/15/2019 admission to the hospital for symptomatic anemia.  At time of admission patient was found to have a hemoglobin of 5.7.  This improved to 7.4 at time of discharge status post 2 units of PRBCs and 1 Feraheme infusion on 05/13/1929.  Serum iron was less than 5 and TIBC 524.  At time of discharge it was recommended that she follow-up with [place] in regards to further evaluation with possible capsule endoscopy.    Today, presents clinic and explains that the weird thing about all of this is that she did not feel bad.  Apparently went to her PCP for dropping something on her toe and he did lab work which showed a hemoglobin of 4 and she was sent to the emergency room.  Describes her hospitalization as above.  Tells me she feels no real difference now.  She has been on iron OTC daily since being in the hospital, combating her constipation with okra seeds and yogurt.  Does tell me this is somewhat disturbing to her stomach, makes me feel like "yucky".  But she is continuing it for now.  Aware that she needs further work-up from [place].  Tells me she did have labs a week ago by her PCP but has not received results.    Denies fever, chills, blood in her stool, change in bowel habits, weight loss, heartburn, reflux or abdominal pain.  Past Medical History:  Diagnosis Date  . Anxiety Dx 2007  . Arthritis   . COPD (chronic  obstructive pulmonary disease) ([place])   . Eczema   . Hyperlipidemia Dx 2010  . Hypertension Dx 2010  . Numbness and tingling of lower extremity     Past Surgical History:  Procedure Laterality Date  . ABDOMINAL HYSTERECTOMY    . FRACTURE SURGERY     Left pinky finger  . LIPOMA EXCISION    . LUMBAR LAMINECTOMY/DECOMPRESSION MICRODISCECTOMY N/A 07/03/2016   Procedure: LUMBAR FOUR - LUMBAR FIVE LAMINECTOMY/DISKECTOMY;  Surgeon: [name] [name], MD;  Location: [place] [facility];  Service: Neurosurgery;  Laterality: N/A;  LUMBAR FOUR - LUMBAR FIVE LAMINECTOMY/DISKECTOMY  . SALIVARY GLAND SURGERY    . sciatica     Dr. Sharmaine Base oct. 2017  . TIBIA FRACTURE SURGERY     right  . TOTAL HIP ARTHROPLASTY Right 02/24/2017   Procedure: RIGHT TOTAL HIP ARTHROPLASTY ANTERIOR APPROACH;  Surgeon: Paralee Cancel, MD;  Location: [facility];  Service: Orthopedics;  Laterality: Right;    Current Outpatient Medications  Medication Sig Dispense Refill  . ALPRAZolam (XANAX) 0.25 MG tablet Take 1 tablet (0.25 mg total) by mouth 2 (two) times daily as needed for anxiety. 60 tablet 4  . ammonium lactate (LAC-HYDRIN) 12 % cream Apply topically as needed for dry skin. 385 g 0  . Biotin 1000 MCG tablet Take 1,000 mcg by mouth daily.     . DULoxetine (CYMBALTA) 60 MG capsule  1 qam (Patient taking differently: Take 60 mg by mouth daily. 1 qam) 30 capsule 4  . ferrous sulfate 325 (65 FE) MG tablet Take 1 tablet (325 mg total) by mouth 2 (two) times daily after a meal. 90 tablet 2  . furosemide (LASIX) 40 MG tablet Take 1 tablet (40 mg total) by mouth daily.    . hydroquinone 4 % cream Apply topically 2 (two) times daily. To dark areas of face. Use of SPF 30 or 50 sunscreen moisturizer 28.35 g 0  . L-LYSINE PO Take 1 tablet by mouth daily.     . metoprolol tartrate (LOPRESSOR) 50 MG tablet Take 1 tablet (50 mg total) by mouth 2 (two) times daily. 60 tablet 11  . Misc. Devices (CANE) MISC 1 each by Does not apply route daily. 1 each 0   . Misc. Devices (HUGO ROLLING WALKER BASIC) MISC 1 each by Does not apply route daily. 1 each 0  . nicotine (NICODERM CQ - DOSED IN MG/24 HOURS) 14 mg/24hr patch Place 1 patch (14 mg total) onto the skin daily. 28 patch 0  . nitroGLYCERIN (NITROSTAT) 0.4 MG SL tablet See admin instructions.    Marland Kitchen oxyCODONE-acetaminophen (PERCOCET/ROXICET) 5-325 MG tablet Take 1 tablet by mouth daily. None after 6 PM.    . pantoprazole (PROTONIX) 40 MG tablet Take 40 mg by mouth 2 (two) times daily.    Marland Kitchen triamcinolone ointment (KENALOG) 0.5 % APPLY 1 APPLICATION TOPICALLY 2 TIMES DAILY. APPLY TO HANDS 30 g 2  . vitamin C (ASCORBIC ACID) 250 MG tablet Take 1 tablet (250 mg total) by mouth daily. 30 tablet 3   No current facility-administered medications for this visit.     Allergies as of 05/31/2019 - Review Complete 05/31/2019  Allergen Reaction Noted  . Other Other (See Comments) 07/02/2016    Family History  Problem Relation Age of Onset  . Diabetes Mother   . Diabetes Sister   . Cancer Brother        colon cancer     Social History   Socioeconomic History  . Marital status: Single    Spouse name: Not on file  . Number of children: Not on file  . Years of education: Not on file  . Highest education level: Not on file  Occupational History  . Not on file  Social Needs  . Financial resource strain: Not on file  . Food insecurity    Worry: Not on file    Inability: Not on file  . Transportation needs    Medical: Not on file    Non-medical: Not on file  Tobacco Use  . Smoking status: Current Some Day Smoker    Packs/day: 0.20    Years: 30.00    Pack years: 6.00    Types: Cigarettes  . Smokeless tobacco: Never Used  . Tobacco comment: Wears Nicotine patch   Substance and Sexual Activity  . Alcohol use: Yes    Alcohol/week: 0.0 standard drinks    Comment: social  . Drug use: No  . Sexual activity: Yes    Partners: Male  Lifestyle  . Physical activity    Days per week: Not on  file    Minutes per session: Not on file  . Stress: Not on file  Relationships  . Social Herbalist on phone: Not on file    Gets together: Not on file    Attends religious service: Not on file    Active member of  club or organization: Not on file    Attends meetings of clubs or organizations: Not on file    Relationship status: Not on file  . Intimate partner violence    Fear of current or ex partner: Not on file    Emotionally abused: Not on file    Physically abused: Not on file    Forced sexual activity: Not on file  Other Topics Concern  . Not on file  Social History Narrative  . Not on file    Review of Systems:    Constitutional: No weight loss, fever, chills, weakness or fatigue Cardiovascular: No chest pain Respiratory: No SOB Gastrointestinal: See HPI and otherwise negative   Physical Exam:  Vital signs: BP 120/76   Pulse 84   Temp 98.1 F (36.7 C)   Ht 5\' 4"  (1.626 m)   Wt 170 lb (77.1 kg)   BMI 29.18 kg/m   Constitutional:   Pleasant AA female appears to be in NAD, Well developed, Well nourished, alert and cooperative Respiratory: Respirations even and unlabored. Lungs clear to auscultation bilaterally.   No wheezes, crackles, or rhonchi.  Cardiovascular: Normal S1, S2. No MRG. Regular rate and rhythm. No peripheral edema, cyanosis or pallor.  Gastrointestinal:  Soft, nondistended, nontender. No rebound or guarding. Normal bowel sounds. No appreciable masses or hepatomegaly. Rectal:  Not performed.  Psychiatric: Demonstrates good judgement and reason without abnormal affect or behaviors.  MOST RECENT LABS AND IMAGING: CBC    Component Value Date/Time   WBC 8.4 05/15/2019 0458   RBC 3.01 (L) 05/15/2019 0458   HGB 7.4 (L) 05/15/2019 0458   HCT 26.1 (L) 05/15/2019 0458   PLT 341 05/15/2019 0458   MCV 86.7 05/15/2019 0458   MCH 24.6 (L) 05/15/2019 0458   MCHC 28.4 (L) 05/15/2019 0458   RDW 18.2 (H) 05/15/2019 0458   LYMPHSABS 2.4 05/13/2019  2052   MONOABS 0.8 05/13/2019 2052   EOSABS 0.2 05/13/2019 2052   BASOSABS 0.1 05/13/2019 2052    CMP     Component Value Date/Time   NA 141 05/15/2019 0458   K 3.4 (L) 05/15/2019 0458   CL 106 05/15/2019 0458   CO2 28 05/15/2019 0458   GLUCOSE 105 (H) 05/15/2019 0458   BUN 9 05/15/2019 0458   CREATININE 0.63 05/15/2019 0458   CREATININE 0.73 07/12/2015 1604   CALCIUM 8.9 05/15/2019 0458   PROT 7.5 05/14/2019 0828   ALBUMIN 3.3 (L) 05/14/2019 0828   AST 17 05/14/2019 0828   ALT 12 05/14/2019 0828   ALKPHOS 84 05/14/2019 0828   BILITOT 0.9 05/14/2019 0828   GFRNONAA >60 05/15/2019 0458   GFRNONAA >89 10/03/2013 1415   GFRAA >60 05/15/2019 0458   GFRAA >89 10/03/2013 1415    Assessment: 1.  Iron deficiency anemia: Recent admission for symptomatic anemia, EGD in May of this year with some intestinal metaplasia but otherwise normal; consider lower GI source versus other  Plan: 1.  Continue iron supplementation on a daily basis. 2.  Repeat labs including iron studies, ferritin and hemoglobin today. 3.  Scheduled patient for a diagnostic colonoscopy in the [place] with Dr. Ardis Hughs for iron deficiency anemia.  Did discuss risks, benefits, limitations and alternatives and the patient agrees to proceed.  Explained to patient that although in the hospital she was told she had a colonoscopy back in October it has not been for 5 years.  Repeating this would be the first step in work-up prior to a capsule endoscopy if necessary.  4.  Scheduled patient for repeat Feraheme infusion 4 weeks from her last one in the hospital. 5.  Patient to follow in clinic per recommendations from Dr. Ardis Hughs after time of procedure.  Ellouise Newer, PA-C [facility] 05/31/2019, 11:39 AM  Cc: Nolene Ebbs, MD

## 2019-06-09 ENCOUNTER — Telehealth: Payer: Self-pay

## 2019-06-09 NOTE — Telephone Encounter (Signed)
Covid-19 screening questions   Do you now or have you had a fever in the last 14 days?  Do you have any respiratory symptoms of shortness of breath or cough now or in the last 14 days?  Do you have any family members or close contacts with diagnosed or suspected Covid-19 in the past 14 days?  Have you been tested for Covid-19 and found to be positive?       

## 2019-06-10 ENCOUNTER — Ambulatory Visit (AMBULATORY_SURGERY_CENTER): Payer: Medicare Other | Admitting: Gastroenterology

## 2019-06-10 ENCOUNTER — Encounter: Payer: Self-pay | Admitting: Gastroenterology

## 2019-06-10 ENCOUNTER — Other Ambulatory Visit: Payer: Self-pay

## 2019-06-10 VITALS — BP 139/61 | HR 89 | Temp 98.5°F | Resp 29 | Ht 64.0 in | Wt 170.0 lb

## 2019-06-10 DIAGNOSIS — K635 Polyp of colon: Secondary | ICD-10-CM | POA: Diagnosis not present

## 2019-06-10 DIAGNOSIS — K5730 Diverticulosis of large intestine without perforation or abscess without bleeding: Secondary | ICD-10-CM | POA: Diagnosis not present

## 2019-06-10 DIAGNOSIS — D123 Benign neoplasm of transverse colon: Secondary | ICD-10-CM

## 2019-06-10 DIAGNOSIS — D124 Benign neoplasm of descending colon: Secondary | ICD-10-CM

## 2019-06-10 DIAGNOSIS — D508 Other iron deficiency anemias: Secondary | ICD-10-CM

## 2019-06-10 MED ORDER — SODIUM CHLORIDE 0.9 % IV SOLN: 500.0000 mL | Freq: Once | INTRAVENOUS | Status: DC

## 2019-06-10 NOTE — Patient Instructions (Signed)
YOU HAD AN ENDOSCOPIC PROCEDURE TODAY AT [facility]:   Refer to the procedure report that was given to you for any specific questions about what was found during the examination.  If the procedure report does not answer your questions, please call your gastroenterologist to clarify.  If you requested that your care partner not be given the details of your procedure findings, then the procedure report has been included in a sealed envelope for you to review at your convenience later.  YOU SHOULD EXPECT: Some feelings of bloating in the abdomen. Passage of more gas than usual.  Walking can help get rid of the air that was put into your GI tract during the procedure and reduce the bloating. If you had a lower endoscopy (such as a colonoscopy or flexible sigmoidoscopy) you may notice spotting of blood in your stool or on the toilet paper. If you underwent a bowel prep for your procedure, you may not have a normal bowel movement for a few days.  Please Note:  You might notice some irritation and congestion in your nose or some drainage.  This is from the oxygen used during your procedure.  There is no need for concern and it should clear up in a day or so.  SYMPTOMS TO REPORT IMMEDIATELY:   Following lower endoscopy (colonoscopy or flexible sigmoidoscopy):  Excessive amounts of blood in the stool  Significant tenderness or worsening of abdominal pains  Swelling of the abdomen that is new, acute  Fever of 100F or higher   For urgent or emergent issues, a gastroenterologist can be reached at any hour by calling [phone].   DIET:  We do recommend a small meal at first, but then you may proceed to your regular diet.  Drink plenty of fluids but you should avoid alcoholic beverages for 24 hours.  MEDICATIONS: Continue present medications.  Please see handouts given to you by your recovery nurse.  FOLLOW UP: Dr. Eugenia Pancoast nurse will call you to arrange a CT scan of the abdomen and  pelvis with IV and oral contrast as well as PA and Lateral CXR to continue the workup of your recent (severe) iron deficiency anemia. 2 bottles of Readi-cat oral contrast with instruction sheet (to be filled out once we know when her appointment is and what time it is) sent home with patient.  ACTIVITY:  You should plan to take it easy for the rest of today and you should NOT DRIVE or use heavy machinery until tomorrow (because of the sedation medicines used during the test).    FOLLOW UP: Our staff will call the number listed on your records 48-72 hours following your procedure to check on you and address any questions or concerns that you may have regarding the information given to you following your procedure. If we do not reach you, we will leave a message.  We will attempt to reach you two times.  During this call, we will ask if you have developed any symptoms of COVID 19. If you develop any symptoms (ie: fever, flu-like symptoms, shortness of breath, cough etc.) before then, please call [phone].  If you test positive for Covid 19 in the 2 weeks post procedure, please call and report this information to [place].    If any biopsies were taken you will be contacted by phone or by letter within the next 1-3 weeks.  Please call us at [phone] if you have not heard about the biopsies in 3 weeks.  Thank you for allowing [place] to provide for your healthcare needs today.   SIGNATURES/CONFIDENTIALITY: You and/or your care partner have signed paperwork which will be entered into your electronic medical record.  These signatures attest to the fact that that the information above on your After Visit Summary has been reviewed and is understood.  Full responsibility of the confidentiality of this discharge information lies with you and/or your care-partner.

## 2019-06-10 NOTE — Progress Notes (Signed)
Called to room to assist during endoscopic procedure.  Patient ID and intended procedure confirmed with present staff. Received instructions for my participation in the procedure from the performing physician.  

## 2019-06-10 NOTE — Progress Notes (Signed)
PT taken to PACU. Monitors in place. VSS. Report given to RN. 

## 2019-06-10 NOTE — Op Note (Addendum)
[place] Patient Name: Meredith Church Procedure Date: 06/10/2019 9:01 AM MRN: [id number] Endoscopist: Milus Banister , MD Age: 63 Referring MD:  Date of Birth: 12/18/1955 Gender: Female Account #: [id number] Procedure:                Colonoscopy Indications:              Iron deficiency anemia Medicines:                Monitored Anesthesia Care Procedure:                Pre-Anesthesia Assessment:                           - Prior to the procedure, a History and Physical                            was performed, and patient medications and                            allergies were reviewed. The patient's tolerance of                            previous anesthesia was also reviewed. The risks                            and benefits of the procedure and the sedation                            options and risks were discussed with the patient.                            All questions were answered, and informed consent                            was obtained. Prior Anticoagulants: The patient has                            taken no previous anticoagulant or antiplatelet                            agents. ASA Grade Assessment: II - A patient with                            mild systemic disease. After reviewing the risks                            and benefits, the patient was deemed in                            satisfactory condition to undergo the procedure.                           After obtaining informed consent, the colonoscope  was passed under direct vision. Throughout the                            procedure, the patient's blood pressure, pulse, and                            oxygen saturations were monitored continuously. The                            Colonoscope was introduced through the anus and                            advanced to the the cecum, identified by                            appendiceal orifice and ileocecal valve. The                           colonoscopy was performed without difficulty. The                            patient tolerated the procedure well. The quality                            of the bowel preparation was good. The ileocecal                            valve, appendiceal orifice, and rectum were                            photographed. Scope In: 9:05:09 AM Scope Out: 9:23:05 AM Scope Withdrawal Time: 0 hours 14 minutes 28 seconds  Total Procedure Duration: 0 hours 17 minutes 56 seconds  Findings:                 One 1-89mm sessile polyp was found in the ascending                            colon, removed with biopsy forceps.                           Four sessile polyps were found in the descending                            colon. The polyps were 2 to 9 mm in size. These                            polyps were removed with a cold snare. Resection                            and retrieval were complete.                           Multiple small and large-mouthed diverticula were  found in the left colon.                           The exam was otherwise without abnormality on                            direct and retroflexion views. Complications:            No immediate complications. Estimated blood loss:                            None. Estimated Blood Loss:     Estimated blood loss: none. Impression:               - Five 2 to 9 mm polyps in the descending colon and                            in the ascending colon, removed with a cold                            snare/biopsy forceps. Resected and retrieved.                           - Diverticulosis in the left colon.                           - The examination was otherwise normal on direct                            and retroflexion views. Recommendation:           - Patient has a contact number available for                            emergencies. The signs and symptoms of potential                             delayed complications were discussed with the                            patient. Return to normal activities tomorrow.                            Written discharge instructions were provided to the                            patient.                           - Resume previous diet.                           - Continue present medications.                           - Await pathology results. Likely repeat  examination in 3-7 years.                           - My office will arrange CT scan abdomen and pelvis                            with IV and oral contrast as well as PA and Lateral                            CXR to continue the workup of your recent (severe)                            iron def anemia. Milus Banister, MD 06/10/2019 9:26:43 AM This report has been signed electronically.

## 2019-06-10 NOTE — Progress Notes (Signed)
[place]

## 2019-06-13 ENCOUNTER — Telehealth: Payer: Self-pay

## 2019-06-13 ENCOUNTER — Ambulatory Visit (HOSPITAL_COMMUNITY)
Admission: RE | Admit: 2019-06-13 | Discharge: 2019-06-13 | Disposition: A | Discharge status: Home / Self Care | Payer: Medicare Other | Source: Ambulatory Visit | Attending: Internal Medicine | Admitting: Internal Medicine

## 2019-06-13 ENCOUNTER — Other Ambulatory Visit: Payer: Self-pay

## 2019-06-13 DIAGNOSIS — D649 Anemia, unspecified: Secondary | ICD-10-CM | POA: Insufficient documentation

## 2019-06-13 DIAGNOSIS — D508 Other iron deficiency anemias: Secondary | ICD-10-CM

## 2019-06-13 MED ORDER — SODIUM CHLORIDE 0.9 % IV SOLN
INTRAVENOUS | Status: DC | PRN
  Administered 2019-06-13: 250 mL via INTRAVENOUS

## 2019-06-13 MED ORDER — SODIUM CHLORIDE 0.9 % IV SOLN
510.0000 mg | Freq: Once | INTRAVENOUS | Status: AC
  Administered 2019-06-13: 510 mg via INTRAVENOUS
  Filled 2019-06-13: qty 17

## 2019-06-13 NOTE — Discharge Instructions (Signed)

## 2019-06-13 NOTE — Progress Notes (Signed)
PATIENT CARE CENTER NOTE  Diagnosis: Anemia, unspecified type (D64.9)   Provider: Ellouise Newer, PA   Procedure: IV Feraheme    Note: Patient received Feraheme infusion. Tolerated well with no adverse reaction. Observed patient for 30 minutes post-infusion. Vital signs stable. Discharge instructions given. Patient alert, oriented and ambulatory at discharge.

## 2019-06-13 NOTE — Telephone Encounter (Signed)
appt for CT scan on 10/5 at 230 pm to arrive at 215 pm.  NPO 4 hours.  Pt needs to pick up contrast from [facility] 2 days prior.  She also needs to have x ray at [facility].  The pt has been advised and will call back with any further questions. The pt has been advised of the information and verbalized understanding.

## 2019-06-13 NOTE — Telephone Encounter (Signed)
Per the procedure report the pt needs to have CT scan abd pelvis with IV and oral contrast and as well as PA/lateral CXR for severe IDA.

## 2019-06-14 ENCOUNTER — Telehealth: Payer: Self-pay | Admitting: *Deleted

## 2019-06-14 ENCOUNTER — Telehealth: Payer: Self-pay

## 2019-06-14 NOTE — Telephone Encounter (Signed)
  Follow up Call-  Call back number 06/10/2019 02/15/2019  Post procedure Call Back phone  # [phone] cell [phone]  Permission to leave phone message Yes Yes  Some recent data might be hidden     Patient questions:   Do you have a fever, pain , or abdominal swelling? No. Pain Score  0 *  Have you tolerated food without any problems? Yes.    Have you been able to return to your normal activities? Yes.    Do you have any questions about your discharge instructions: Diet   No. Medications  No. Follow up visit  No.  Do you have questions or concerns about your Care? No.  Actions: * If pain score is 4 or above: No action needed, pain <4.  1. Have you developed a fever since your procedure? no  2.   Have you had an respiratory symptoms (SOB or cough) since your procedure? no  3.   Have you tested positive for COVID 19 since your procedure no  4.   Have you had any family members/close contacts diagnosed with the COVID 19 since your procedure?  no  If yes to any of these questions please route to Joylene John, RN and Alphonsa Gin, Therapist, sports.

## 2019-06-14 NOTE — Telephone Encounter (Signed)
Left message on f/u call 

## 2019-06-17 ENCOUNTER — Encounter: Payer: Self-pay | Admitting: Gastroenterology

## 2019-06-20 ENCOUNTER — Encounter (HOSPITAL_COMMUNITY): Payer: Self-pay

## 2019-06-20 ENCOUNTER — Ambulatory Visit (HOSPITAL_COMMUNITY)
Admission: RE | Admit: 2019-06-20 | Discharge: 2019-06-20 | Disposition: A | Discharge status: Home / Self Care | Payer: Medicare Other | Source: Ambulatory Visit | Attending: Gastroenterology | Admitting: Gastroenterology

## 2019-06-20 ENCOUNTER — Other Ambulatory Visit: Payer: Self-pay

## 2019-06-20 DIAGNOSIS — Z96641 Presence of right artificial hip joint: Secondary | ICD-10-CM | POA: Diagnosis not present

## 2019-06-20 DIAGNOSIS — I2510 Atherosclerotic heart disease of native coronary artery without angina pectoris: Secondary | ICD-10-CM | POA: Insufficient documentation

## 2019-06-20 DIAGNOSIS — D508 Other iron deficiency anemias: Secondary | ICD-10-CM | POA: Diagnosis not present

## 2019-06-20 DIAGNOSIS — I700 Atherosclerosis of aorta: Secondary | ICD-10-CM | POA: Insufficient documentation

## 2019-06-20 MED ORDER — SODIUM CHLORIDE (PF) 0.9 % IJ SOLN
INTRAMUSCULAR | Status: AC
  Filled 2019-06-20: qty 50

## 2019-06-20 MED ORDER — IOHEXOL 300 MG/ML  SOLN
100.0000 mL | Freq: Once | INTRAMUSCULAR | Status: AC | PRN
  Administered 2019-06-20: 15:00:00 100 mL via INTRAVENOUS

## 2019-06-22 ENCOUNTER — Other Ambulatory Visit: Payer: Self-pay

## 2019-06-22 DIAGNOSIS — D508 Other iron deficiency anemias: Secondary | ICD-10-CM

## 2019-08-04 ENCOUNTER — Ambulatory Visit (HOSPITAL_COMMUNITY): Payer: Medicare Other | Admitting: Psychiatry

## 2019-08-19 ENCOUNTER — Ambulatory Visit (HOSPITAL_COMMUNITY): Payer: Medicare Other | Admitting: Psychiatry

## 2019-08-19 ENCOUNTER — Other Ambulatory Visit: Payer: Self-pay

## 2019-09-07 ENCOUNTER — Ambulatory Visit (HOSPITAL_COMMUNITY): Payer: Medicare Other | Admitting: Psychiatry

## 2019-09-20 ENCOUNTER — Ambulatory Visit (INDEPENDENT_AMBULATORY_CARE_PROVIDER_SITE_OTHER): Payer: Medicare Other | Admitting: Psychiatry

## 2019-09-20 ENCOUNTER — Other Ambulatory Visit: Payer: Self-pay

## 2019-09-20 DIAGNOSIS — F411 Generalized anxiety disorder: Secondary | ICD-10-CM

## 2019-09-20 DIAGNOSIS — Z720 Tobacco use: Secondary | ICD-10-CM

## 2019-09-20 DIAGNOSIS — G4709 Other insomnia: Secondary | ICD-10-CM | POA: Diagnosis not present

## 2019-09-20 DIAGNOSIS — F325 Major depressive disorder, single episode, in full remission: Secondary | ICD-10-CM | POA: Diagnosis not present

## 2019-09-20 MED ORDER — ALPRAZOLAM 0.25 MG PO TABS: 0.2500 mg | ORAL_TABLET | Freq: Two times a day (BID) | ORAL | 4 refills | Status: DC | PRN

## 2019-09-20 MED ORDER — DULOXETINE HCL 60 MG PO CPEP: 60.0000 mg | ORAL_CAPSULE | Freq: Every day | ORAL | 5 refills | Status: DC

## 2019-09-20 NOTE — Progress Notes (Signed)
[facility] MD/PA/NP OP Progress Note  09/20/2019 4:32 PM Meredith Church  MRN:  [id number]  Chief Complaint: Depression  HPI:  Today the patient is doing very well.  She is positive and optimistic.  She denies daily depression.  She is sleeping and eating well.  She is got good energy.  She has no problems thinking and concentrating.  She stays active as best she can.  She has a supportive family.  She denies the use of drugs or alcohol.  She has no psychotic symptoms.  Generally she does well taking Cymbalta and a small dose of Xanax at night for sleep.  The patient is functioning very well.  She is got good energy.  She is articulate and friendly.  She is looking forward to taking the vaccine.  At this time she is very cautious wears a mask when appropriate.   ICD-10-CM   1. Other insomnia  G47.09 ALPRAZolam (XANAX) 0.25 MG tablet  2. GAD (generalized anxiety disorder)  F41.1 ALPRAZolam (XANAX) 0.25 MG tablet    Past Psychiatric History: See intake H&P for full details. Reviewed, with no updates at this time.   Past Medical History:  Past Medical History:  Diagnosis Date  . Anemia   . Anxiety Dx 2007  . Arthritis   . Blood transfusion without reported diagnosis   . COPD (chronic obstructive pulmonary disease) ([place])   . Depression   . Eczema   . Hyperlipidemia Dx 2010  . Hypertension Dx 2010  . Numbness and tingling of lower extremity     Past Surgical History:  Procedure Laterality Date  . ABDOMINAL HYSTERECTOMY    . COLONOSCOPY    . FRACTURE SURGERY     Left pinky finger  . LIPOMA EXCISION    . LUMBAR LAMINECTOMY/DECOMPRESSION MICRODISCECTOMY N/A 07/03/2016   Procedure: LUMBAR FOUR - LUMBAR FIVE LAMINECTOMY/DISKECTOMY;  Surgeon: [name] [name], MD;  Location: [facility];  Service: Neurosurgery;  Laterality: N/A;  LUMBAR FOUR - LUMBAR FIVE LAMINECTOMY/DISKECTOMY  . POLYPECTOMY    . SALIVARY GLAND SURGERY    . sciatica     Dr. Sharmaine Base oct. 2017  . TIBIA FRACTURE SURGERY     right   . TOTAL HIP ARTHROPLASTY Right 02/24/2017   Procedure: RIGHT TOTAL HIP ARTHROPLASTY ANTERIOR APPROACH;  Surgeon: Paralee Cancel, MD;  Location: [facility];  Service: Orthopedics;  Laterality: Right;  . UPPER GASTROINTESTINAL ENDOSCOPY      Family Psychiatric History: See intake H&P for full details. Reviewed, with no updates at this time.   Family History:  Family History  Problem Relation Age of Onset  . Diabetes Mother   . Diabetes Sister   . Cancer Brother        colon cancer   . Colon cancer Brother        dx in 23's  . Esophageal cancer Neg Hx   . Rectal cancer Neg Hx   . Stomach cancer Neg Hx     Social History:  Social History   Socioeconomic History  . Marital status: Single    Spouse name: Not on file  . Number of children: Not on file  . Years of education: Not on file  . Highest education level: Not on file  Occupational History  . Not on file  Tobacco Use  . Smoking status: Current Some Day Smoker    Packs/day: 0.20    Years: 30.00    Pack years: 6.00    Types: Cigarettes  . Smokeless tobacco: Never  Used  . Tobacco comment: Wears Nicotine patch   Substance and Sexual Activity  . Alcohol use: Yes    Alcohol/week: 0.0 standard drinks    Comment: social  . Drug use: No  . Sexual activity: Yes    Partners: Male  Other Topics Concern  . Not on file  Social History Narrative  . Not on file   Social Determinants of Health   Financial Resource Strain:   . Difficulty of Paying Living Expenses: Not on file  Food Insecurity:   . Worried About Charity fundraiser in the Last Year: Not on file  . Ran Out of Food in the Last Year: Not on file  Transportation Needs:   . Lack of Transportation (Medical): Not on file  . Lack of Transportation (Non-Medical): Not on file  Physical Activity:   . Days of Exercise per Week: Not on file  . Minutes of Exercise per Session: Not on file  Stress:   . Feeling of Stress : Not on file  Social Connections:   . Frequency  of Communication with Friends and Family: Not on file  . Frequency of Social Gatherings with Friends and Family: Not on file  . Attends Religious Services: Not on file  . Active Member of Clubs or Organizations: Not on file  . Attends Archivist Meetings: Not on file  . Marital Status: Not on file    Allergies:  Allergies  Allergen Reactions  . Other Other (See Comments)     Nickel "breaks my skin out" Fire ants  . Sulfa Antibiotics Swelling    Metabolic Disorder Labs: Lab Results  Component Value Date   HGBA1C 5.9 10/03/2013   No results found for: PROLACTIN Lab Results  Component Value Date   CHOL 188 10/03/2013   TRIG 267 (H) 10/03/2013   HDL 42 10/03/2013   CHOLHDL 4.5 10/03/2013   VLDL 53 (H) 10/03/2013   LDLCALC 93 10/03/2013   Lab Results  Component Value Date   TSH 1.569 05/14/2019   TSH 0.936 10/03/2013    Therapeutic Level Labs: No results found for: LITHIUM No results found for: VALPROATE No components found for:  CBMZ  Current Medications: Current Outpatient Medications  Medication Sig Dispense Refill  . ALPRAZolam (XANAX) 0.25 MG tablet Take 1 tablet (0.25 mg total) by mouth 2 (two) times daily as needed for anxiety. 60 tablet 4  . ammonium lactate (LAC-HYDRIN) 12 % cream Apply topically as needed for dry skin. 385 g 0  . Biotin 1000 MCG tablet Take 1,000 mcg by mouth daily.     . DULoxetine (CYMBALTA) 60 MG capsule Take 1 capsule (60 mg total) by mouth daily. 1 qam 30 capsule 5  . ferrous sulfate 325 (65 FE) MG tablet Take 1 tablet (325 mg total) by mouth 2 (two) times daily after a meal. (Patient not taking: Reported on 06/10/2019) 90 tablet 2  . furosemide (LASIX) 40 MG tablet Take 1 tablet (40 mg total) by mouth daily. (Patient not taking: Reported on 06/10/2019)    . hydroquinone 4 % cream Apply topically 2 (two) times daily. To dark areas of face. Use of SPF 30 or 50 sunscreen moisturizer (Patient not taking: Reported on 06/10/2019) 28.35  g 0  . L-LYSINE PO Take 1 tablet by mouth daily.     . metoprolol tartrate (LOPRESSOR) 50 MG tablet Take 1 tablet (50 mg total) by mouth 2 (two) times daily. 60 tablet 11  . Misc. Devices (CANE) MISC  1 each by Does not apply route daily. 1 each 0  . Misc. Devices (HUGO ROLLING WALKER BASIC) MISC 1 each by Does not apply route daily. 1 each 0  . nicotine (NICODERM CQ - DOSED IN MG/24 HOURS) 14 mg/24hr patch Place 1 patch (14 mg total) onto the skin daily. 28 patch 0  . nitroGLYCERIN (NITROSTAT) 0.4 MG SL tablet See admin instructions.    Marland Kitchen oxyCODONE-acetaminophen (PERCOCET/ROXICET) 5-325 MG tablet Take 1 tablet by mouth daily. None after 6 PM.    . pantoprazole (PROTONIX) 40 MG tablet Take 40 mg by mouth 2 (two) times daily.    Marland Kitchen triamcinolone ointment (KENALOG) 0.5 % APPLY 1 APPLICATION TOPICALLY 2 TIMES DAILY. APPLY TO HANDS 30 g 2  . vitamin C (ASCORBIC ACID) 250 MG tablet Take 1 tablet (250 mg total) by mouth daily. 30 tablet 3   Current Facility-Administered Medications  Medication Dose Route Frequency Provider Last Rate Last Admin  . 0.9 %  sodium chloride infusion  500 mL Intravenous Once Milus Banister, MD         Musculoskeletal: Strength & Muscle Tone: decreased Gait & Station: unsteady, shuffle Patient leans: N/A  Psychiatric Specialty Exam: ROS  There were no vitals taken for this visit.There is no height or weight on file to calculate BMI.  General Appearance: Casual and Fairly Groomed  Eye Contact:  Fair  Speech:  Clear and Coherent, Normal Rate and wears dentures  Volume:  Normal  Mood:  Dysphoric  Affect:  Congruent  Thought Process:  Goal Directed and Descriptions of Associations: Intact  Orientation:  Full (Time, Place, and Person)  Thought Content: Logical   Suicidal Thoughts:  No  Homicidal Thoughts:  No  Memory:  Immediate;   Fair  Judgement:  Fair  Insight:  Fair  Psychomotor Activity:  Normal  Concentration:  Concentration: Poor  Recall:  [employer]  of Knowledge: Fair  Language: Fair  Akathisia:  Negative  Handed:  Right  AIMS (if indicated): not done  Assets:  Communication Skills Desire for Improvement Financial Resources/Insurance Housing  ADL's:  Intact  Cognition: WNL  Sleep:  Poor   Screenings: GAD-7     Office Visit from 04/13/2017 in [facility] Office Visit from 11/28/2016 in [facility] Office Visit from 11/14/2016 in [facility] Office Visit from 09/02/2016 in [facility] Office Visit from 08/15/2016 in [facility]  Total GAD-7 Score  18  18  20  19  20     PHQ2-9     Counselor from 02/10/2018 in [facility] Counselor from 01/26/2018 in [facility] Office Visit from 04/13/2017 in [facility] Office Visit from 11/28/2016 in [facility] Office Visit from 11/14/2016 in [facility]  PHQ-2 Total Score  6  6  6  5  6   PHQ-9 Total Score  16  17  18  17  21        Assessment and Plan:    This patient has 2 problems.  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  She takes Cymbalta 60 mg every morning.  Her second problem is insomnia.  The patient takes Xanax 0.25 mg 1 or 2 pills every night.  She gets a good night of sleep.  She  is functioning extremely well.  She will be seen again in 4 or 5 months. 1. Other insomnia   2. GAD (generalized anxiety disorder)     Status of current problems: unchanged  Labs Ordered: No orders of the defined types were placed in this encounter.   Labs Reviewed: n/a  Collateral Obtained/Records Reviewed: n/a  Plan:  Elavil discontinued Xanax 0.25 mg BID prn anxiety/sleep Turn to clinic in 12 weeks

## 2020-01-18 ENCOUNTER — Other Ambulatory Visit: Payer: Self-pay

## 2020-01-18 ENCOUNTER — Telehealth (INDEPENDENT_AMBULATORY_CARE_PROVIDER_SITE_OTHER): Payer: Medicare Other | Admitting: Psychiatry

## 2020-01-18 DIAGNOSIS — F325 Major depressive disorder, single episode, in full remission: Secondary | ICD-10-CM | POA: Diagnosis not present

## 2020-01-18 NOTE — Progress Notes (Signed)
[place] MD/PA/NP OP Progress Note  01/18/2020 3:48 PM Meredith Church  MRN:  [id number]  Chief Complaint: Depression  HPI:  At this time the patient is at her baseline.  She will continue taking her Cymbalta as prescribed.  She is engaging and friendly.  She is not anhedonic.  She has no evidence of psychosis.  She denies use of drugs or alcohol.  He takes a small dose of Xanax helps her sleep.  She is functioning very well.  She denies chest pain shortness of breath or any neurological symptoms. Past Psychiatric History: See intake H&P for full details. Reviewed, with no updates at this time.   Past Medical History:  Past Medical History:  Diagnosis Date  . Anemia   . Anxiety Dx 2007  . Arthritis   . Blood transfusion without reported diagnosis   . COPD (chronic obstructive pulmonary disease) ([place])   . Depression   . Eczema   . Hyperlipidemia Dx 2010  . Hypertension Dx 2010  . Numbness and tingling of lower extremity     Past Surgical History:  Procedure Laterality Date  . ABDOMINAL HYSTERECTOMY    . COLONOSCOPY    . FRACTURE SURGERY     Left pinky finger  . LIPOMA EXCISION    . LUMBAR LAMINECTOMY/DECOMPRESSION MICRODISCECTOMY N/A 07/03/2016   Procedure: LUMBAR FOUR - LUMBAR FIVE LAMINECTOMY/DISKECTOMY;  Surgeon: [name] [name], MD;  Location: [facility];  Service: Neurosurgery;  Laterality: N/A;  LUMBAR FOUR - LUMBAR FIVE LAMINECTOMY/DISKECTOMY  . POLYPECTOMY    . SALIVARY GLAND SURGERY    . sciatica     Dr. Sharmaine Base oct. 2017  . TIBIA FRACTURE SURGERY     right  . TOTAL HIP ARTHROPLASTY Right 02/24/2017   Procedure: RIGHT TOTAL HIP ARTHROPLASTY ANTERIOR APPROACH;  Surgeon: Paralee Cancel, MD;  Location[facility];  Service: Orthopedics;  Laterality: Right;  . UPPER GASTROINTESTINAL ENDOSCOPY      Family Psychiatric History: See intake H&P for full details. Reviewed, with no updates at this time.   Family History:  Family History  Problem Relation Age of Onset  . Diabetes  Mother   . Diabetes Sister   . Cancer Brother        colon cancer   . Colon cancer Brother        dx in 65's  . Esophageal cancer Neg Hx   . Rectal cancer Neg Hx   . Stomach cancer Neg Hx     Social History:  Social History   Socioeconomic History  . Marital status: Single    Spouse name: Not on file  . Number of children: Not on file  . Years of education: Not on file  . Highest education level: Not on file  Occupational History  . Not on file  Tobacco Use  . Smoking status: Current Some Day Smoker    Packs/day: 0.20    Years: 30.00    Pack years: 6.00    Types: Cigarettes  . Smokeless tobacco: Never Used  . Tobacco comment: Wears Nicotine patch   Substance and Sexual Activity  . Alcohol use: Yes    Alcohol/week: 0.0 standard drinks    Comment: social  . Drug use: No  . Sexual activity: Yes    Partners: Male  Other Topics Concern  . Not on file  Social History Narrative  . Not on file   Social Determinants of Health   Financial Resource Strain:   . Difficulty of Paying Living Expenses:  Food Insecurity:   . Worried About Charity fundraiser in the Last Year:   . Arboriculturist in the Last Year:   Transportation Needs:   . Film/video editor (Medical):   Marland Kitchen Lack of Transportation (Non-Medical):   Physical Activity:   . Days of Exercise per Week:   . Minutes of Exercise per Session:   Stress:   . Feeling of Stress :   Social Connections:   . Frequency of Communication with Friends and Family:   . Frequency of Social Gatherings with Friends and Family:   . Attends Religious Services:   . Active Member of Clubs or Organizations:   . Attends Archivist Meetings:   Marland Kitchen Marital Status:     Allergies:  Allergies  Allergen Reactions  . Other Other (See Comments)     Nickel "breaks my skin out" Fire ants  . Sulfa Antibiotics Swelling    Metabolic Disorder Labs: Lab Results  Component Value Date   HGBA1C 5.9 10/03/2013   No results  found for: PROLACTIN Lab Results  Component Value Date   CHOL 188 10/03/2013   TRIG 267 (H) 10/03/2013   HDL 42 10/03/2013   CHOLHDL 4.5 10/03/2013   VLDL 53 (H) 10/03/2013   LDLCALC 93 10/03/2013   Lab Results  Component Value Date   TSH 1.569 05/14/2019   TSH 0.936 10/03/2013    Therapeutic Level Labs: No results found for: LITHIUM No results found for: VALPROATE No components found for:  CBMZ  Current Medications: Current Outpatient Medications  Medication Sig Dispense Refill  . ALPRAZolam (XANAX) 0.25 MG tablet Take 1 tablet (0.25 mg total) by mouth 2 (two) times daily as needed for anxiety. 60 tablet 4  . ammonium lactate (LAC-HYDRIN) 12 % cream Apply topically as needed for dry skin. 385 g 0  . Biotin 1000 MCG tablet Take 1,000 mcg by mouth daily.     . DULoxetine (CYMBALTA) 60 MG capsule Take 1 capsule (60 mg total) by mouth daily. 1 qam 30 capsule 5  . ferrous sulfate 325 (65 FE) MG tablet Take 1 tablet (325 mg total) by mouth 2 (two) times daily after a meal. (Patient not taking: Reported on 06/10/2019) 90 tablet 2  . furosemide (LASIX) 40 MG tablet Take 1 tablet (40 mg total) by mouth daily. (Patient not taking: Reported on 06/10/2019)    . hydroquinone 4 % cream Apply topically 2 (two) times daily. To dark areas of face. Use of SPF 30 or 50 sunscreen moisturizer (Patient not taking: Reported on 06/10/2019) 28.35 g 0  . L-LYSINE PO Take 1 tablet by mouth daily.     . metoprolol tartrate (LOPRESSOR) 50 MG tablet Take 1 tablet (50 mg total) by mouth 2 (two) times daily. 60 tablet 11  . Misc. Devices (CANE) MISC 1 each by Does not apply route daily. 1 each 0  . Misc. Devices (HUGO ROLLING WALKER BASIC) MISC 1 each by Does not apply route daily. 1 each 0  . nicotine (NICODERM CQ - DOSED IN MG/24 HOURS) 14 mg/24hr patch Place 1 patch (14 mg total) onto the skin daily. 28 patch 0  . nitroGLYCERIN (NITROSTAT) 0.4 MG SL tablet See admin instructions.    Marland Kitchen oxyCODONE-acetaminophen  (PERCOCET/ROXICET) 5-325 MG tablet Take 1 tablet by mouth daily. None after 6 PM.    . pantoprazole (PROTONIX) 40 MG tablet Take 40 mg by mouth 2 (two) times daily.    Marland Kitchen triamcinolone ointment (KENALOG) 0.5 %  APPLY 1 APPLICATION TOPICALLY 2 TIMES DAILY. APPLY TO HANDS 30 g 2  . vitamin C (ASCORBIC ACID) 250 MG tablet Take 1 tablet (250 mg total) by mouth daily. 30 tablet 3   Current Facility-Administered Medications  Medication Dose Route Frequency Provider Last Rate Last Admin  . 0.9 %  sodium chloride infusion  500 mL Intravenous Once Milus Banister, MD         Musculoskeletal: Strength & Muscle Tone: decreased Gait & Station: unsteady, shuffle Patient leans: N/A  Psychiatric Specialty Exam: ROS  There were no vitals taken for this visit.There is no height or weight on file to calculate BMI.  General Appearance: Casual and Fairly Groomed  Eye Contact:  Fair  Speech:  Clear and Coherent, Normal Rate and wears dentures  Volume:  Normal  Mood:  Dysphoric  Affect:  Congruent  Thought Process:  Goal Directed and Descriptions of Associations: Intact  Orientation:  Full (Time, Place, and Person)  Thought Content: Logical   Suicidal Thoughts:  No  Homicidal Thoughts:  No  Memory:  Immediate;   Fair  Judgement:  Fair  Insight:  Fair  Psychomotor Activity:  Normal  Concentration:  Concentration: Poor  Recall:  [employer] of Knowledge: Fair  Language: Fair  Akathisia:  Negative  Handed:  Right  AIMS (if indicated): not done  Assets:  Communication Skills Desire for Improvement Financial Resources/Insurance Housing  ADL's:  Intact  Cognition: WNL  Sleep:  Poor   Screenings: GAD-7     Office Visit from 04/13/2017 in [facility] Office Visit from 11/28/2016 in [facility] Office Visit from 11/14/2016 in [place] Office Visit from 09/02/2016 in [facility]  Office Visit from 08/15/2016 in [facility]  Total GAD-7 Score  18  18  20  19  20     PHQ2-9     Counselor from 02/10/2018 in [facility] Counselor from 01/26/2018 in [facility] Office Visit from 04/13/2017 in [place] Office Visit from 11/28/2016 in [place] [facility] Office Visit from 11/14/2016 in [facility]  PHQ-2 Total Score  6  6  6  5  6   PHQ-9 Total Score  16  17  18  17  21        Assessment and Plan:   At this time the patient is doing very well.  #1 problem is major depression.  She takes Cymbalta and does well.  Her second problem is that of insomnia.  The patient takes Xanax 1 or 2 at night.  The patient shows no evidence of abuse or misuse of her medications. No diagnosis found.  Status of current problems: unchanged  Labs Ordered: No orders of the defined types were placed in this encounter.   Labs Reviewed: n/a  Collateral Obtained/Records Reviewed: n/a  Plan:  Elavil discontinued Xanax 0.25 mg BID prn anxiety/sleep Turn to clinic in 12 weeks

## 2020-05-17 ENCOUNTER — Other Ambulatory Visit: Payer: Self-pay | Admitting: Obstetrics and Gynecology

## 2020-05-18 ENCOUNTER — Other Ambulatory Visit (HOSPITAL_COMMUNITY)
Admission: RE | Admit: 2020-05-18 | Discharge: 2020-05-18 | Disposition: A | Discharge status: Home / Self Care | Payer: Medicare Other | Source: Ambulatory Visit | Attending: Obstetrics | Admitting: Obstetrics

## 2020-05-18 ENCOUNTER — Encounter: Payer: Self-pay | Admitting: Obstetrics

## 2020-05-18 ENCOUNTER — Ambulatory Visit (INDEPENDENT_AMBULATORY_CARE_PROVIDER_SITE_OTHER): Payer: Medicare Other | Admitting: Obstetrics

## 2020-05-18 ENCOUNTER — Other Ambulatory Visit: Payer: Self-pay

## 2020-05-18 VITALS — BP 137/79 | HR 71 | Ht 64.0 in | Wt 168.0 lb

## 2020-05-18 DIAGNOSIS — N898 Other specified noninflammatory disorders of vagina: Secondary | ICD-10-CM | POA: Diagnosis present

## 2020-05-18 NOTE — Progress Notes (Signed)
Patient ID: Meredith Church, female   DOB: 05/12/1956, 64 y.o.   MRN: [id number]  Chief Complaint  Patient presents with  . Vaginitis    HPI Meredith Church is a 64 y.o. female.  Complains of vaginal discharge.  Denies vaginal irritation or odor. HPI  Past Medical History:  Diagnosis Date  . Anemia   . Anxiety Dx 2007  . Arthritis   . Blood transfusion without reported diagnosis   . COPD (chronic obstructive pulmonary disease) ([place])   . Depression   . Eczema   . Hyperlipidemia Dx 2010  . Hypertension Dx 2010  . Numbness and tingling of lower extremity     Past Surgical History:  Procedure Laterality Date  . ABDOMINAL HYSTERECTOMY    . COLONOSCOPY    . FRACTURE SURGERY     Left pinky finger  . LIPOMA EXCISION    . LUMBAR LAMINECTOMY/DECOMPRESSION MICRODISCECTOMY N/A 07/03/2016   Procedure: LUMBAR FOUR - LUMBAR FIVE LAMINECTOMY/DISKECTOMY;  Surgeon: [name] [name], MD;  Location: [facility];  Service: Neurosurgery;  Laterality: N/A;  LUMBAR FOUR - LUMBAR FIVE LAMINECTOMY/DISKECTOMY  . POLYPECTOMY    . SALIVARY GLAND SURGERY    . sciatica     Dr. Sharmaine Base oct. 2017  . TIBIA FRACTURE SURGERY     right  . TOTAL HIP ARTHROPLASTY Right 02/24/2017   Procedure: RIGHT TOTAL HIP ARTHROPLASTY ANTERIOR APPROACH;  Surgeon: Paralee Cancel, MD;  [facility];  Service: Orthopedics;  Laterality: Right;  . UPPER GASTROINTESTINAL ENDOSCOPY      Family History  Problem Relation Age of Onset  . Diabetes Mother   . Diabetes Sister   . Cancer Brother        colon cancer   . Colon cancer Brother        dx in 4's  . Esophageal cancer Neg Hx   . Rectal cancer Neg Hx   . Stomach cancer Neg Hx     Social History Social History   Tobacco Use  . Smoking status: Current Some Day Smoker    Packs/day: 0.20    Years: 30.00    Pack years: 6.00    Types: Cigarettes  . Smokeless tobacco: Never Used  . Tobacco comment: Wears Nicotine patch   Vaping Use  . Vaping Use: Never used   Substance Use Topics  . Alcohol use: Yes    Alcohol/week: 0.0 standard drinks    Comment: social  . Drug use: Not Currently    Allergies  Allergen Reactions  . Other Other (See Comments)     Nickel "breaks my skin out" Fire ants  . Sulfa Antibiotics Swelling    Current Outpatient Medications  Medication Sig Dispense Refill  . ALPRAZolam (XANAX) 0.25 MG tablet Take 1 tablet (0.25 mg total) by mouth 2 (two) times daily as needed for anxiety. 60 tablet 4  . ammonium lactate (LAC-HYDRIN) 12 % cream Apply topically as needed for dry skin. 385 g 0  . Biotin 1000 MCG tablet Take 1,000 mcg by mouth daily.     . DULoxetine (CYMBALTA) 60 MG capsule Take 1 capsule (60 mg total) by mouth daily. 1 qam 30 capsule 5  . ferrous sulfate 325 (65 FE) MG tablet Take 1 tablet (325 mg total) by mouth 2 (two) times daily after a meal. 90 tablet 2  . furosemide (LASIX) 40 MG tablet Take 1 tablet (40 mg total) by mouth daily.    . hydroquinone 4 % cream Apply topically 2 (two) times  daily. To dark areas of face. Use of SPF 30 or 50 sunscreen moisturizer 28.35 g 0  . L-LYSINE PO Take 1 tablet by mouth daily.     . metoprolol tartrate (LOPRESSOR) 50 MG tablet Take 1 tablet (50 mg total) by mouth 2 (two) times daily. 60 tablet 11  . Misc. Devices (CANE) MISC 1 each by Does not apply route daily. 1 each 0  . Misc. Devices (HUGO ROLLING WALKER BASIC) MISC 1 each by Does not apply route daily. 1 each 0  . nicotine (NICODERM CQ - DOSED IN MG/24 HOURS) 14 mg/24hr patch Place 1 patch (14 mg total) onto the skin daily. 28 patch 0  . nitroGLYCERIN (NITROSTAT) 0.4 MG SL tablet See admin instructions.    Marland Kitchen oxyCODONE-acetaminophen (PERCOCET/ROXICET) 5-325 MG tablet Take 1 tablet by mouth daily. None after 6 PM.    . pantoprazole (PROTONIX) 40 MG tablet Take 40 mg by mouth 2 (two) times daily.    Marland Kitchen triamcinolone ointment (KENALOG) 0.5 % APPLY 1 APPLICATION TOPICALLY 2 TIMES DAILY. APPLY TO HANDS 30 g 2  . vitamin C  (ASCORBIC ACID) 250 MG tablet Take 1 tablet (250 mg total) by mouth daily. 30 tablet 3   Current Facility-Administered Medications  Medication Dose Route Frequency Provider Last Rate Last Admin  . 0.9 %  sodium chloride infusion  500 mL Intravenous Once Milus Banister, MD        Review of Systems Review of Systems Constitutional: negative for fatigue and weight loss Respiratory: negative for cough and wheezing Cardiovascular: negative for chest pain, fatigue and palpitations Gastrointestinal: negative for abdominal pain and change in bowel habits Genitourinary: positive for vaginal discharge Integument/breast: negative for nipple discharge Musculoskeletal:negative for myalgias Neurological: negative for gait problems and tremors Behavioral/Psych: negative for abusive relationship, depression Endocrine: negative for temperature intolerance      Blood pressure 137/79, pulse 71, height 5\' 4"  (1.626 m), weight 168 lb (76.2 kg).  Physical Exam Physical Exam General:   alert  Skin:   no rash or abnormalities  Lungs:   clear to auscultation bilaterally  Heart:   regular rate and rhythm, S1, S2 normal, no murmur, click, rub or gallop  Breasts:   normal without suspicious masses, skin or nipple changes or axillary nodes  Abdomen:  normal findings: no organomegaly, soft, non-tender and no hernia  Pelvis:  External genitalia: normal general appearance Urinary system: urethral meatus normal and bladder without fullness, nontender Vaginal: normal without tenderness, induration or masses Cervix: normal appearance Adnexa: normal bimanual exam Uterus: anteverted and non-tender, normal size    50% of 15 min visit spent on counseling and coordination of care.   Data Reviewed Wet Prep  Assessment     1. Vaginal discharge Rx: - Cervicovaginal ancillary only( [place])    Plan   Follow up prn    Shelly Bombard, MD 05/18/2020 1:39 PM

## 2020-05-18 NOTE — Progress Notes (Signed)
RGYN patient presents for problem visit.   CC:  Vaginal irritation with vaginal  discharge .   Onset 2 weeks ago pt denies any odor, no itching    Mammogram : 03/24/2018 WNL

## 2020-05-23 ENCOUNTER — Telehealth: Payer: Self-pay

## 2020-05-23 ENCOUNTER — Other Ambulatory Visit: Payer: Self-pay | Admitting: Obstetrics

## 2020-05-23 DIAGNOSIS — B9689 Other specified bacterial agents as the cause of diseases classified elsewhere: Secondary | ICD-10-CM

## 2020-05-23 LAB — CERVICOVAGINAL ANCILLARY ONLY
Bacterial Vaginitis (gardnerella): POSITIVE — AB
Candida Glabrata: NEGATIVE
Candida Vaginitis: NEGATIVE
Chlamydia: NEGATIVE
Comment: NEGATIVE
Comment: NEGATIVE
Comment: NEGATIVE
Comment: NEGATIVE
Comment: NEGATIVE
Comment: NORMAL
Neisseria Gonorrhea: NEGATIVE
Trichomonas: NEGATIVE

## 2020-05-23 MED ORDER — METRONIDAZOLE 500 MG PO TABS: 500.0000 mg | ORAL_TABLET | Freq: Two times a day (BID) | ORAL | 2 refills | Status: DC

## 2020-05-23 NOTE — Telephone Encounter (Signed)
-----   Message from Shelly Bombard, MD sent at 05/23/2020 12:31 PM EDT ----- Flagyl Rx for BV

## 2020-05-23 NOTE — Telephone Encounter (Signed)
Tried to contact pt by phone to make aware of +BV results and that Rx has been sent.  Pt not ava left message and sent Mychart message today.

## 2020-05-27 ENCOUNTER — Other Ambulatory Visit (HOSPITAL_COMMUNITY): Payer: Self-pay | Admitting: Psychiatry

## 2020-05-27 DIAGNOSIS — G4709 Other insomnia: Secondary | ICD-10-CM

## 2020-05-27 DIAGNOSIS — F411 Generalized anxiety disorder: Secondary | ICD-10-CM

## 2020-05-28 ENCOUNTER — Other Ambulatory Visit (HOSPITAL_COMMUNITY): Payer: Self-pay | Admitting: *Deleted

## 2020-05-28 DIAGNOSIS — G4709 Other insomnia: Secondary | ICD-10-CM

## 2020-05-28 DIAGNOSIS — F411 Generalized anxiety disorder: Secondary | ICD-10-CM

## 2020-05-28 MED ORDER — ALPRAZOLAM 0.25 MG PO TABS: 0.2500 mg | ORAL_TABLET | Freq: Two times a day (BID) | ORAL | 0 refills | Status: DC | PRN

## 2020-06-20 ENCOUNTER — Other Ambulatory Visit: Payer: Self-pay

## 2020-06-20 ENCOUNTER — Ambulatory Visit (INDEPENDENT_AMBULATORY_CARE_PROVIDER_SITE_OTHER): Payer: Medicare Other | Admitting: Psychiatry

## 2020-06-20 DIAGNOSIS — F411 Generalized anxiety disorder: Secondary | ICD-10-CM | POA: Diagnosis not present

## 2020-06-20 DIAGNOSIS — G4709 Other insomnia: Secondary | ICD-10-CM | POA: Diagnosis not present

## 2020-06-20 MED ORDER — ALPRAZOLAM 0.25 MG PO TABS: 0.2500 mg | ORAL_TABLET | Freq: Two times a day (BID) | ORAL | 4 refills | Status: DC | PRN

## 2020-06-20 MED ORDER — DULOXETINE HCL 60 MG PO CPEP
60.0000 mg | ORAL_CAPSULE | Freq: Every day | ORAL | 5 refills | Status: DC
Start: 2020-06-20 — End: 2020-10-24

## 2020-06-20 NOTE — Progress Notes (Signed)
[place] MD/PA/NP OP Progress Note  06/20/2020 4:21 PM Meredith Church  MRN:  [id number]  Chief Complaint: Depression  HPI:     At this time the patient is doing well.  Her mood is good.  She is getting up and getting out.  She is sleeping and eating well does not use any alcohol and takes her medicine as prescribed.  She has been diagnosed with anemia and now she is on iron replacement.  She has good relationships with her kids.  The patient lives independently.  She makes her own food.  Patient still drives.  Her anxiety depression well controlled.  She denies anhedonia.  She is really doing well at this time. Past Psychiatric History: See intake H&P for full details. Reviewed, with no updates at this time.   Past Medical History:  Past Medical History:  Diagnosis Date  . Anemia   . Anxiety Dx 2007  . Arthritis   . Blood transfusion without reported diagnosis   . COPD (chronic obstructive pulmonary disease) ([place])   . Depression   . Eczema   . Hyperlipidemia Dx 2010  . Hypertension Dx 2010  . Numbness and tingling of lower extremity     Past Surgical History:  Procedure Laterality Date  . ABDOMINAL HYSTERECTOMY    . COLONOSCOPY    . FRACTURE SURGERY     Left pinky finger  . LIPOMA EXCISION    . LUMBAR LAMINECTOMY/DECOMPRESSION MICRODISCECTOMY N/A 07/03/2016   Procedure: LUMBAR FOUR - LUMBAR FIVE LAMINECTOMY/DISKECTOMY;  Surgeon: [name] [name], MD;  Location: [facility];  Service: Neurosurgery;  Laterality: N/A;  LUMBAR FOUR - LUMBAR FIVE LAMINECTOMY/DISKECTOMY  . POLYPECTOMY    . SALIVARY GLAND SURGERY    . sciatica     Dr. Sharmaine Base oct. 2017  . TIBIA FRACTURE SURGERY     right  . TOTAL HIP ARTHROPLASTY Right 02/24/2017   Procedure: RIGHT TOTAL HIP ARTHROPLASTY ANTERIOR APPROACH;  Surgeon: Paralee Cancel, MD;  [facility];  Service: Orthopedics;  Laterality: Right;  . UPPER GASTROINTESTINAL ENDOSCOPY      Family Psychiatric History: See intake H&P for full details.  Reviewed, with no updates at this time.   Family History:  Family History  Problem Relation Age of Onset  . Diabetes Mother   . Diabetes Sister   . Cancer Brother        colon cancer   . Colon cancer Brother        dx in 24's  . Esophageal cancer Neg Hx   . Rectal cancer Neg Hx   . Stomach cancer Neg Hx     Social History:  Social History   Socioeconomic History  . Marital status: Single    Spouse name: Not on file  . Number of children: Not on file  . Years of education: Not on file  . Highest education level: Not on file  Occupational History  . Not on file  Tobacco Use  . Smoking status: Current Some Day Smoker    Packs/day: 0.20    Years: 30.00    Pack years: 6.00    Types: Cigarettes  . Smokeless tobacco: Never Used  . Tobacco comment: Wears Nicotine patch   Vaping Use  . Vaping Use: Never used  Substance and Sexual Activity  . Alcohol use: Yes    Alcohol/week: 0.0 standard drinks    Comment: social  . Drug use: Not Currently  . Sexual activity: Yes    Partners: Male  Other Topics  Concern  . Not on file  Social History Narrative  . Not on file   Social Determinants of Health   Financial Resource Strain:   . Difficulty of Paying Living Expenses: Not on file  Food Insecurity:   . Worried About Charity fundraiser in the Last Year: Not on file  . Ran Out of Food in the Last Year: Not on file  Transportation Needs:   . Lack of Transportation (Medical): Not on file  . Lack of Transportation (Non-Medical): Not on file  Physical Activity:   . Days of Exercise per Week: Not on file  . Minutes of Exercise per Session: Not on file  Stress:   . Feeling of Stress : Not on file  Social Connections:   . Frequency of Communication with Friends and Family: Not on file  . Frequency of Social Gatherings with Friends and Family: Not on file  . Attends Religious Services: Not on file  . Active Member of Clubs or Organizations: Not on file  . Attends Theatre manager Meetings: Not on file  . Marital Status: Not on file    Allergies:  Allergies  Allergen Reactions  . Other Other (See Comments)     Nickel "breaks my skin out" Fire ants  . Sulfa Antibiotics Swelling    Metabolic Disorder Labs: Lab Results  Component Value Date   HGBA1C 5.9 10/03/2013   No results found for: PROLACTIN Lab Results  Component Value Date   CHOL 188 10/03/2013   TRIG 267 (H) 10/03/2013   HDL 42 10/03/2013   CHOLHDL 4.5 10/03/2013   VLDL 53 (H) 10/03/2013   LDLCALC 93 10/03/2013   Lab Results  Component Value Date   TSH 1.569 05/14/2019   TSH 0.936 10/03/2013    Therapeutic Level Labs: No results found for: LITHIUM No results found for: VALPROATE No components found for:  CBMZ  Current Medications: Current Outpatient Medications  Medication Sig Dispense Refill  . ALPRAZolam (XANAX) 0.25 MG tablet Take 1 tablet (0.25 mg total) by mouth 2 (two) times daily as needed for anxiety. 60 tablet 0  . ALPRAZolam (XANAX) 0.25 MG tablet Take 1 tablet (0.25 mg total) by mouth 2 (two) times daily as needed for anxiety. 60 tablet 4  . ammonium lactate (LAC-HYDRIN) 12 % cream Apply topically as needed for dry skin. 385 g 0  . Biotin 1000 MCG tablet Take 1,000 mcg by mouth daily.     . DULoxetine (CYMBALTA) 60 MG capsule Take 1 capsule (60 mg total) by mouth daily. 1 qam 30 capsule 5  . ferrous sulfate 325 (65 FE) MG tablet Take 1 tablet (325 mg total) by mouth 2 (two) times daily after a meal. 90 tablet 2  . furosemide (LASIX) 40 MG tablet Take 1 tablet (40 mg total) by mouth daily.    . hydroquinone 4 % cream Apply topically 2 (two) times daily. To dark areas of face. Use of SPF 30 or 50 sunscreen moisturizer 28.35 g 0  . L-LYSINE PO Take 1 tablet by mouth daily.     . metoprolol tartrate (LOPRESSOR) 50 MG tablet Take 1 tablet (50 mg total) by mouth 2 (two) times daily. 60 tablet 11  . metroNIDAZOLE (FLAGYL) 500 MG tablet Take 1 tablet (500 mg total) by  mouth 2 (two) times daily. 14 tablet 2  . Misc. Devices (CANE) MISC 1 each by Does not apply route daily. 1 each 0  . Misc. Devices ([place])  MISC 1 each by Does not apply route daily. 1 each 0  . nicotine (NICODERM CQ - DOSED IN MG/24 HOURS) 14 mg/24hr patch Place 1 patch (14 mg total) onto the skin daily. 28 patch 0  . nitroGLYCERIN (NITROSTAT) 0.4 MG SL tablet See admin instructions.    Marland Kitchen oxyCODONE-acetaminophen (PERCOCET/ROXICET) 5-325 MG tablet Take 1 tablet by mouth daily. None after 6 PM.    . pantoprazole (PROTONIX) 40 MG tablet Take 40 mg by mouth 2 (two) times daily.    Marland Kitchen triamcinolone ointment (KENALOG) 0.5 % APPLY 1 APPLICATION TOPICALLY 2 TIMES DAILY. APPLY TO HANDS 30 g 2  . vitamin C (ASCORBIC ACID) 250 MG tablet Take 1 tablet (250 mg total) by mouth daily. 30 tablet 3   Current Facility-Administered Medications  Medication Dose Route Frequency Provider Last Rate Last Admin  . 0.9 %  sodium chloride infusion  500 mL Intravenous Once Milus Banister, MD         Musculoskeletal: Strength & Muscle Tone: decreased Gait & Station: unsteady, shuffle Patient leans: N/A  Psychiatric Specialty Exam: ROS  There were no vitals taken for this visit.There is no height or weight on file to calculate BMI.  General Appearance: Casual and Fairly Groomed  Eye Contact:  Fair  Speech:  Clear and Coherent, Normal Rate and wears dentures  Volume:  Normal  Mood:  Dysphoric  Affect:  Congruent  Thought Process:  Goal Directed and Descriptions of Associations: Intact  Orientation:  Full (Time, Place, and Person)  Thought Content: Logical   Suicidal Thoughts:  No  Homicidal Thoughts:  No  Memory:  Immediate;   Fair  Judgement:  Fair  Insight:  Fair  Psychomotor Activity:  Normal  Concentration:  Concentration: Poor  Recall:  [employer] of Knowledge: Fair  Language: Fair  Akathisia:  Negative  Handed:  Right  AIMS (if indicated): not done  Assets:  Communication  Skills Desire for Improvement Financial Resources/Insurance Housing  ADL's:  Intact  Cognition: WNL  Sleep:  Poor   Screenings: GAD-7     Office Visit from 04/13/2017 in [facility] Office Visit from 11/28/2016 in [facility] Office Visit from 11/14/2016 in [facility] Office Visit from 09/02/2016 in [facility] Office Visit from 08/15/2016 in [facility]  Total GAD-7 Score 18 18 20 19 20     PHQ2-9     Counselor from 02/10/2018 in [facility] Counselor from 01/26/2018 in [facility] Office Visit from 04/13/2017 in [facility] Office Visit from 11/28/2016 in [facility] Office Visit from 11/14/2016 in [facility]  PHQ-2 Total Score 6 6 6 5 6   PHQ-9 Total Score 16 17 18 17 21        Assessment and Plan:   This patient's first problem is that of generalized anxiety disorder.  She will continue taking Cymbalta for that condition.  She also takes a small dose of Xanax one or two a day.  Patient is functioning very well.  She can concentrate well.  She is not really having very much fatigue.  She is got energy.  Her anxiety is significantly impaired improved.  Her medications clearly are helpful.  Patient be seen again in about 4 months.. 1. Other insomnia   2. GAD (generalized anxiety disorder)  Status of current problems: unchanged  Labs Ordered: No orders of the defined types were placed in this encounter.   Labs Reviewed: n/a  Collateral Obtained/Records Reviewed: n/a  Plan:  Elavil discontinued Xanax 0.25 mg BID prn anxiety/sleep Turn to clinic in 12 weeks

## 2020-07-05 ENCOUNTER — Other Ambulatory Visit: Payer: Self-pay | Admitting: Internal Medicine

## 2020-07-05 DIAGNOSIS — Z1231 Encounter for screening mammogram for malignant neoplasm of breast: Secondary | ICD-10-CM

## 2020-07-16 ENCOUNTER — Telehealth: Payer: Self-pay | Admitting: Hematology and Oncology

## 2020-07-16 NOTE — Telephone Encounter (Signed)
Received a new hem referral from Dr. Lorra Hals for chronic anemia. Meredith Church has been cld and scheduled to see Dr. Chryl Heck on 11/4 at 11am. Pt aware to arrive 30 minutes early.

## 2020-07-18 NOTE — Progress Notes (Signed)
[facility] CONSULT NOTE   Patient Care Team: Meredith Ebbs, MD as PCP - General (Internal Medicine)  CHIEF COMPLAINTS/PURPOSE OF CONSULTATION:  Chronic anemia  HISTORY OF PRESENTING ILLNESS:  Meredith Church 64 y.o. female is here because of chronic anemia.  Pt went to her PCP for FU. She had some routine labs which demonstrated anemia and hence referred to hematology.  According to the patient, last year she had severe anemia and required a blood transfusion and also received a dose of intravenous iron following which she took oral iron for about a couple months and when she felt better she discontinued it.  Recently she started noticing that she is tired easily, has more fatigue and has been craving ice.  She reported this to her primary care physician which prompted some labs.  She was noted to have iron deficiency again, had endoscopy and colonoscopy as well in September 2020 and referred to hematology. Meredith Church arrived to the appointment today by herself.  She tells me that she got a little anxious when she did not see my name on the board.  She is a good historian.  She tells me that she has been feeling more tired, has noticed some chest pain which she describes as a feeling that she needs to burp and this resolves after she burps, shortness of breath, not able to do all the chores like she was and a lot of craving for ice in the past few months.  She went to her doctor who recommended iron supplementation, ferrous sulfate, 65 mg twice a day which she started taking in June July, she took this for a couple months and she had such severe nausea and switch to some other formulary of iron which she obtained at health store which had about 8 mg of elemental iron.  She started taking 3 tablets of this in the a.m. and 3 tablets of this in p.m. She absolutely denies any hematochezia or melena.  She denies any hematuria.  No vaginal bleeding.  No new medications.  No recent surgeries.   No family history of anemia. She has been intermittently low on iron for several years now. She has required iron and blood transfusion as well as oral iron supplementation from time to time. She had endoscopy and colonoscopy in September 2020 which revealed sessile polyps, nonmalignant but otherwise no major pathology that could explain the iron deficiency anemia.  Family history noted for colon cancer in her brother, diagnosed in 63s and alive at the time. She is understandably anxious coming to the doctor.  She denies any reactions to intravenous iron supplementation. Review of systems pertinent for easy fatigue, shortness of breath on exertion, some chest pain which she describes as a feeling that she needs to burp and this resolves after burping, no chest pressure tightness suggestive of cardiac causes, pica.  No hair loss or brittle nails. Rest of the pertinent 10 point ROS reviewed and negative.  MEDICAL HISTORY:  Past Medical History:  Diagnosis Date   Anemia    Anxiety Dx 2007   Arthritis    Blood transfusion without reported diagnosis    COPD (chronic obstructive pulmonary disease) ([place])    Depression    Eczema    Hyperlipidemia Dx 2010   Hypertension Dx 2010   Numbness and tingling of lower extremity     SURGICAL HISTORY: Past Surgical History:  Procedure Laterality Date   ABDOMINAL HYSTERECTOMY     COLONOSCOPY  FRACTURE SURGERY     Left pinky finger   LIPOMA EXCISION     LUMBAR LAMINECTOMY/DECOMPRESSION MICRODISCECTOMY N/A 07/03/2016   Procedure: LUMBAR FOUR - LUMBAR FIVE LAMINECTOMY/DISKECTOMY;  Surgeon: Meredith Ny [name], MD;  Location: [facility];  Service: Neurosurgery;  Laterality: N/A;  LUMBAR FOUR - LUMBAR FIVE LAMINECTOMY/DISKECTOMY   POLYPECTOMY     SALIVARY GLAND SURGERY     sciatica     Meredith Church oct. 2017   TIBIA FRACTURE SURGERY     right   TOTAL HIP ARTHROPLASTY Right 02/24/2017   Procedure: RIGHT TOTAL HIP ARTHROPLASTY ANTERIOR  APPROACH;  Surgeon: Meredith Cancel, MD;  Location: [facility];  Service: Orthopedics;  Laterality: Right;   UPPER GASTROINTESTINAL ENDOSCOPY      SOCIAL HISTORY: Social History   Socioeconomic History   Marital status: Single    Spouse name: Not on file   Number of children: Not on file   Years of education: Not on file   Highest education level: Not on file  Occupational History   Not on file  Tobacco Use   Smoking status: Current Some Day Smoker    Packs/day: 0.20    Years: 30.00    Pack years: 6.00    Types: Cigarettes   Smokeless tobacco: Never Used   Tobacco comment: Wears Nicotine patch   Vaping Use   Vaping Use: Never used  Substance and Sexual Activity   Alcohol use: Yes    Alcohol/week: 0.0 standard drinks    Comment: social   Drug use: Not Currently   Sexual activity: Yes    Partners: Male  Other Topics Concern   Not on file  Social History Narrative   Not on file   Social Determinants of Health   Financial Resource Strain:    Difficulty of Paying Living Expenses: Not on file  Food Insecurity:    Worried About [place] in the Last Year: Not on file   Ran Out of Food in the Last Year: Not on file  Transportation Needs:    Lack of Transportation (Medical): Not on file   Lack of Transportation (Non-Medical): Not on file  Physical Activity:    Days of Exercise per Week: Not on file   Minutes of Exercise per Session: Not on file  Stress:    Feeling of Stress : Not on file  Social Connections:    Frequency of Communication with Friends and Family: Not on file   Frequency of Social Gatherings with Friends and Family: Not on file   Attends Religious Services: Not on file   Active Member of Clubs or Organizations: Not on file   Attends Archivist Meetings: Not on file   Marital Status: Not on file  Intimate Partner Violence:    Fear of Current or Ex-Partner: Not on file   Emotionally Abused: Not on file    Physically Abused: Not on file   Sexually Abused: Not on file    FAMILY HISTORY: Family History  Problem Relation Age of Onset   Diabetes Mother    Diabetes Sister    Cancer Brother        colon cancer    Colon cancer Brother        dx in 62's   Esophageal cancer Neg Hx    Rectal cancer Neg Hx    Stomach cancer Neg Hx     ALLERGIES:  is allergic to other and sulfa antibiotics.  MEDICATIONS:  Current Outpatient Medications  Medication  Sig Dispense Refill   ALPRAZolam (XANAX) 0.25 MG tablet Take 1 tablet (0.25 mg total) by mouth 2 (two) times daily as needed for anxiety. 60 tablet 0   ammonium lactate (LAC-HYDRIN) 12 % cream Apply topically as needed for dry skin. 385 g 0   Biotin 1000 MCG tablet Take 1,000 mcg by mouth daily.      DULoxetine (CYMBALTA) 60 MG capsule Take 1 capsule (60 mg total) by mouth daily. 1 qam 30 capsule 5   hydroquinone 4 % cream Apply topically 2 (two) times daily. To dark areas of face. Use of SPF 30 or 50 sunscreen moisturizer 28.35 g 0   metoprolol tartrate (LOPRESSOR) 50 MG tablet Take 1 tablet (50 mg total) by mouth 2 (two) times daily. 60 tablet 11   Misc. Devices (CANE) MISC 1 each by Does not apply route daily. 1 each 0   Misc. Devices (HUGO ROLLING WALKER BASIC) MISC 1 each by Does not apply route daily. 1 each 0   nicotine (NICODERM CQ - DOSED IN MG/24 HOURS) 14 mg/24hr patch Place 1 patch (14 mg total) onto the skin daily. 28 patch 0   OVER THE COUNTER MEDICATION Take 3 capsules by mouth 2 (two) times daily. [employer] Energizing Iron (Red Blood Cell Support Blend)     oxyCODONE-acetaminophen (PERCOCET/ROXICET) 5-325 MG tablet Take 1 tablet by mouth daily. None after 6 PM.     pantoprazole (PROTONIX) 40 MG tablet Take 40 mg by mouth 2 (two) times daily.     triamcinolone ointment (KENALOG) 0.5 % APPLY 1 APPLICATION TOPICALLY 2 TIMES DAILY. APPLY TO HANDS 30 g 2   vitamin C (ASCORBIC ACID) 250 MG tablet Take 1 tablet (250  mg total) by mouth daily. 30 tablet 3   nitroGLYCERIN (NITROSTAT) 0.4 MG SL tablet See admin instructions. (Patient not taking: Reported on 07/19/2020)     Current Facility-Administered Medications  Medication Dose Route Frequency Provider Last Rate Last Admin   0.9 %  sodium chloride infusion  500 mL Intravenous Once Milus Banister, MD        PHYSICAL EXAMINATION: ECOG PERFORMANCE STATUS: 1 - Symptomatic but completely ambulatory  Vitals:   07/19/20 1116  BP: (!) 150/88  Pulse: 79  Resp: 18  Temp: 98.1 F (36.7 C)  SpO2: 100%   Filed Weights   07/19/20 1116  Weight: 171 lb 8 oz (77.8 kg)    GENERAL:alert, no distress and comfortable SKIN: skin color, texture, turgor are normal, no rashes or significant lesions EYES: normal, conjunctiva non-injected, sclera clear, slight pallor OROPHARYNX:no exudate, no erythema and lips, buccal mucosa, and tongue normal  NECK: supple, nontender. LYMPH:  no palpable lymphadenopathy in the cervical, axillary or inguinal LUNGS: clear to auscultation and percussion with normal breathing effort HEART: regular rate & rhythm and no murmurs and no lower extremity edema ABDOMEN:abdomen soft, non-tender and normal bowel sounds.  No hepatosplenomegaly. Musculoskeletal:no cyanosis of digits and no clubbing.  Trace edema bilateral lower extremities, symmetrical. PSYCH: alert & oriented x 3 with fluent speech NEURO: no focal motor/sensory deficits  LABORATORY DATA:  I have reviewed the data as listed Lab Results  Component Value Date   WBC 6.8 05/31/2019   HGB 12.2 05/31/2019   HCT 39.4 05/31/2019   MCV 92.1 05/31/2019   PLT 369.0 05/31/2019     Chemistry      Component Value Date/Time   NA 141 05/15/2019 0458   K 3.4 (L) 05/15/2019 0458   CL 106 05/15/2019  0458   CO2 28 05/15/2019 0458   BUN 9 05/15/2019 0458   CREATININE 0.63 05/15/2019 0458   CREATININE 0.73 07/12/2015 1604      Component Value Date/Time   CALCIUM 8.9 05/15/2019  0458   ALKPHOS 84 05/14/2019 0828   AST 17 05/14/2019 0828   ALT 12 05/14/2019 0828   BILITOT 0.9 05/14/2019 0828     Reviewed outside labs Iron panel and ferritin suggestive of IDA, ferritin of 17 and TIBC over 400  MCV is 95. 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 Last endoscopy and colonoscopy 2020 reviewed, sessile polyps otherwise no clear source of bleeding. Reviewed labs over the past few yrs, which showed intermittent macrocytosis and anemia. Surg path from colonoscopy negative for dysplasia or malignancy.  RADIOGRAPHIC STUDIES: I have personally reviewed the radiological images as listed and agreed with the findings in the report. No results found.  ASSESSMENT & PLAN:   This is a very pleasant 64 year old African-American female patient with past medical history significant for hypertension, dyslipidemia, arthritis, iron deficiency anemia referred to hematology for further recommendations regarding ongoing iron deficiency anemia.  She was seen by her primary care physician who has ordered some labs suggestive of iron deficiency, also had endoscopy and colonoscopy in September 2020 without any explanation for iron deficiency.  She is now symptomatic with easy fatigue, shortness of breath on exertion, some indigestion from iron supplementation, pica. Physical examination healthy-appearing female patient, no palpable lymphadenopathy or hepatosplenomegaly, mild ankle edema no brittle nails noted or hair loss noted.  I reviewed her labs from October 2021 as well as her prior labs.  She intermittently had iron deficiency and required iron supplementation.  Most recent labs suggestive of iron deficiency with total iron binding capacity close to 450, ferritin and iron saturation of 4% and hemoglobin of 8.9. I50 and folic acid levels are normal.  Reticulocyte count mildly elevated most likely from iron supplementation.   Mean corpuscular volume at the higher limit of normal, this has been intermittently high from review of her labs. Overall, I do believe this is likely a case of iron deficiency anemia etiology unless proven otherwise has to be intermittent blood loss. I do not suspect bone marrow disorders given the clear evidence of iron deficiency from labs.  She expressed intolerance to oral iron, she has tried this for at least a couple months or longer and expresses severe nausea, indigestion and chest pain from indigestion.  Given her poor tolerance to oral iron, I think this is perfectly reasonable to consider intravenous iron.  I have discussed about Venofer weekly for 3 doses given her ferritin of 17, target ferritin of about 100.  We have discussed about small risk for anaphylaxis which is less than 1% with intravenous iron lately.  We have discussed about possible side effects of flulike symptoms, headaches. Also recommended capsule endoscopy to look for any other evidence of blood loss.  If her primary care physician can kindly arrange for this, this will be greatly appreciated. I recommended that she return to clinic in 3 months, we will do a history and physical and repeat labs at that time to document resolution of iron deficiency anemia. She is up-to-date with age-appropriate cancer screening, due soon for a mammogram had to be delayed slightly given the recent Covid vaccine. Pica, expect resolution with iron supplementation.  She was asked to contact her office with any questions or concerns or if  she continues to feel unwell after iron supplementation. No problem-specific Assessment & Plan notes found for this encounter.  No orders of the defined types were placed in this encounter.  All questions were answered. The patient knows to call the clinic with any problems, questions or concerns. I spent 60 minutes counseling the patient face to face. The total time spent in the appointment was 40 minutes and  more than 50% was on counseling.     Benay Pike, MD 07/19/2020 11:56 AM

## 2020-07-19 ENCOUNTER — Inpatient Hospital Stay: Payer: Medicare Other | Attending: Hematology and Oncology | Admitting: Hematology and Oncology

## 2020-07-19 ENCOUNTER — Encounter: Payer: Self-pay | Admitting: Hematology and Oncology

## 2020-07-19 ENCOUNTER — Other Ambulatory Visit: Payer: Self-pay

## 2020-07-19 VITALS — BP 150/88 | HR 79 | Temp 98.1°F | Resp 18 | Ht 64.0 in | Wt 171.5 lb

## 2020-07-19 DIAGNOSIS — Z800 Family history of malignant neoplasm of digestive organs: Secondary | ICD-10-CM | POA: Insufficient documentation

## 2020-07-19 DIAGNOSIS — D509 Iron deficiency anemia, unspecified: Secondary | ICD-10-CM | POA: Diagnosis present

## 2020-07-19 DIAGNOSIS — E785 Hyperlipidemia, unspecified: Secondary | ICD-10-CM | POA: Insufficient documentation

## 2020-07-19 DIAGNOSIS — M129 Arthropathy, unspecified: Secondary | ICD-10-CM | POA: Diagnosis not present

## 2020-07-19 DIAGNOSIS — I10 Essential (primary) hypertension: Secondary | ICD-10-CM | POA: Insufficient documentation

## 2020-07-19 DIAGNOSIS — Z79899 Other long term (current) drug therapy: Secondary | ICD-10-CM | POA: Diagnosis not present

## 2020-07-19 DIAGNOSIS — J449 Chronic obstructive pulmonary disease, unspecified: Secondary | ICD-10-CM | POA: Insufficient documentation

## 2020-07-19 DIAGNOSIS — F17210 Nicotine dependence, cigarettes, uncomplicated: Secondary | ICD-10-CM | POA: Insufficient documentation

## 2020-07-19 DIAGNOSIS — Z923 Personal history of irradiation: Secondary | ICD-10-CM | POA: Insufficient documentation

## 2020-07-19 DIAGNOSIS — F418 Other specified anxiety disorders: Secondary | ICD-10-CM | POA: Diagnosis not present

## 2020-07-20 ENCOUNTER — Telehealth: Payer: Self-pay | Admitting: Hematology and Oncology

## 2020-07-20 NOTE — Telephone Encounter (Signed)
Scheduled per 11/04 los, patient has been called and notified.

## 2020-07-23 ENCOUNTER — Telehealth: Payer: Self-pay | Admitting: *Deleted

## 2020-07-23 ENCOUNTER — Telehealth: Payer: Self-pay | Admitting: Hematology and Oncology

## 2020-07-23 NOTE — Telephone Encounter (Signed)
Scheduled appointments per 11/8 [facility] msg. Spoke to patient who is aware of appointments dates and times. Scheduled 3rd infusion Monday after Thanksgiving because patient will be out of town week of Thanksgiving.

## 2020-07-23 NOTE — Telephone Encounter (Signed)
Patient called to report that she has stopped using the [employer] Iron as she found out she wasn't taking enough Iron so she switched back to the ferrous sulfate 325 mg.  She does report she is having some nausea with it but not too terrible since she is eating prior to taking the medication.  Routed to Dr. Chryl Heck as Juluis Rainier.

## 2020-07-26 ENCOUNTER — Other Ambulatory Visit: Payer: Self-pay

## 2020-07-26 ENCOUNTER — Inpatient Hospital Stay: Payer: Medicare Other

## 2020-07-26 VITALS — BP 141/77 | HR 92 | Temp 98.6°F | Resp 16 | Ht 64.0 in | Wt 165.8 lb

## 2020-07-26 DIAGNOSIS — D649 Anemia, unspecified: Secondary | ICD-10-CM

## 2020-07-26 DIAGNOSIS — D509 Iron deficiency anemia, unspecified: Secondary | ICD-10-CM | POA: Diagnosis not present

## 2020-07-26 MED ORDER — SODIUM CHLORIDE 0.9 % IV SOLN
200.0000 mg | Freq: Once | INTRAVENOUS | Status: AC
  Administered 2020-07-26: 200 mg via INTRAVENOUS
  Filled 2020-07-26: qty 200

## 2020-07-26 MED ORDER — SODIUM CHLORIDE 0.9 % IV SOLN
Freq: Once | INTRAVENOUS | Status: AC
  Filled 2020-07-26: qty 250

## 2020-07-26 NOTE — Patient Instructions (Addendum)
Iron Sucrose injection What is this medicine? IRON SUCROSE (AHY ern SOO krohs) is an iron complex. Iron is used to make healthy red blood cells, which carry oxygen and nutrients throughout the body. This medicine is used to treat iron deficiency anemia in people with chronic kidney disease. This medicine may be used for other purposes; ask your health care provider or pharmacist if you have questions. COMMON BRAND NAME(S): Venofer What should I tell my health care provider before I take this medicine? They need to know if you have any of these conditions:  anemia not caused by low iron levels  heart disease  high levels of iron in the blood  kidney disease  liver disease  an unusual or allergic reaction to iron, other medicines, foods, dyes, or preservatives  pregnant or trying to get pregnant  breast-feeding How should I use this medicine? This medicine is for infusion into a vein. It is given by a health care professional in a hospital or clinic setting. Talk to your pediatrician regarding the use of this medicine in children. While this drug may be prescribed for children as young as 2 years for selected conditions, precautions do apply. Overdosage: If you think you have taken too much of this medicine contact a poison control center or emergency room at once. NOTE: This medicine is only for you. Do not share this medicine with others. What if I miss a dose? It is important not to miss your dose. Call your doctor or health care professional if you are unable to keep an appointment. What may interact with this medicine? Do not take this medicine with any of the following medications:  deferoxamine  dimercaprol  other iron products This medicine may also interact with the following medications:  chloramphenicol  deferasirox This list may not describe all possible interactions. Give your health care provider a list of all the medicines, herbs, non-prescription drugs, or  dietary supplements you use. Also tell them if you smoke, drink alcohol, or use illegal drugs. Some items may interact with your medicine. What should I watch for while using this medicine? Visit your doctor or healthcare professional regularly. Tell your doctor or healthcare professional if your symptoms do not start to get better or if they get worse. You may need blood work done while you are taking this medicine. You may need to follow a special diet. Talk to your doctor. Foods that contain iron include: whole grains/cereals, dried fruits, beans, or peas, leafy green vegetables, and organ meats (liver, kidney). What side effects may I notice from receiving this medicine? Side effects that you should report to your doctor or health care professional as soon as possible:  allergic reactions like skin rash, itching or hives, swelling of the face, lips, or tongue  breathing problems  changes in blood pressure  cough  fast, irregular heartbeat  feeling faint or lightheaded, falls  fever or chills  flushing, sweating, or hot feelings  joint or muscle aches/pains  seizures  swelling of the ankles or feet  unusually weak or tired Side effects that usually do not require medical attention (report to your doctor or health care professional if they continue or are bothersome):  diarrhea  feeling achy  headache  irritation at site where injected  nausea, vomiting  stomach upset  tiredness This list may not describe all possible side effects. Call your doctor for medical advice about side effects. You may report side effects to FDA at 1-800-FDA-1088. Where should I keep   my medicine? This drug is given in a hospital or clinic and will not be stored at home. NOTE: This sheet is a summary. It may not cover all possible information. If you have questions about this medicine, talk to your doctor, pharmacist, or health care provider.  2020 Elsevier/Gold Standard (2011-06-12  17:14:35)   Muscle Cramps and Spasms Muscle cramps and spasms are when muscles tighten by themselves. They usually get better within minutes. Muscle cramps are painful. They are usually stronger and last longer than muscle spasms. Muscle spasms may or may not be painful. They can last a few seconds or much longer. Cramps and spasms can affect any muscle, but they occur most often in the calf muscles of the leg. They are usually not caused by a serious problem. In many cases, the cause is not known. Some common causes include:  Doing more physical work or exercise than your body is ready for.  Using the muscles too much (overuse) by repeating certain movements too many times.  Staying in a certain position for a long time.  Playing a sport or doing an activity without preparing properly.  Using bad form or technique while playing a sport or doing an activity.  Not having enough water in your body (dehydration).  Injury.  Side effects of some medicines.  Low levels of the salts and minerals in your blood (electrolytes), such as low potassium or calcium. Follow these instructions at home: Managing pain and stiffness      Massage, stretch, and relax the muscle. Do this for many minutes at a time.  If told, put heat on tight or tense muscles as often as told by your doctor. Use the heat source that your doctor recommends, such as a moist heat pack or a heating pad. ? Place a towel between your skin and the heat source. ? Leave the heat on for 20-30 minutes. ? Remove the heat if your skin turns bright red. This is very important if you are not able to feel pain, heat, or cold. You may have a greater risk of getting burned.  If told, put ice on the affected area. This may help if you are sore or have pain after a cramp or spasm. ? Put ice in a plastic bag. ? Place a towel between your skin and the bag. ? Leave the ice on for 20 minutes, 2-3 times a day.  Try taking hot showers or  baths to help relax tight muscles. Eating and drinking  Drink enough fluid to keep your pee (urine) pale yellow.  Eat a healthy diet to help ensure that your muscles work well. This should include: ? Fruits and vegetables. ? Lean protein. ? Whole grains. ? Low-fat or nonfat dairy products. General instructions  If you are having cramps often, avoid intense exercise for several days.  Take over-the-counter and prescription medicines only as told by your doctor.  Watch for any changes in your symptoms.  Keep all follow-up visits as told by your doctor. This is important. Contact a doctor if:  Your cramps or spasms get worse or happen more often.  Your cramps or spasms do not get better with time. Summary  Muscle cramps and spasms are when muscles tighten by themselves. They usually get better within minutes.  Cramps and spasms occur most often in the calf muscles of the leg.  Massage, stretch, and relax the muscle. This may help the cramp or spasm go away.  Drink enough fluid to keep  your pee (urine) pale yellow. This information is not intended to replace advice given to you by your health care provider. Make sure you discuss any questions you have with your health care provider. Document Revised: 01/25/2018 Document Reviewed: 01/25/2018 Elsevier Patient Education  [place].

## 2020-08-02 ENCOUNTER — Other Ambulatory Visit: Payer: Self-pay

## 2020-08-02 ENCOUNTER — Inpatient Hospital Stay: Payer: Medicare Other

## 2020-08-02 VITALS — BP 150/89 | HR 98 | Temp 98.6°F | Resp 16

## 2020-08-02 DIAGNOSIS — D649 Anemia, unspecified: Secondary | ICD-10-CM

## 2020-08-02 DIAGNOSIS — D509 Iron deficiency anemia, unspecified: Secondary | ICD-10-CM | POA: Diagnosis not present

## 2020-08-02 MED ORDER — SODIUM CHLORIDE 0.9 % IV SOLN
Freq: Once | INTRAVENOUS | Status: AC
  Filled 2020-08-02: qty 250

## 2020-08-02 MED ORDER — SODIUM CHLORIDE 0.9 % IV SOLN
200.0000 mg | Freq: Once | INTRAVENOUS | Status: AC
  Administered 2020-08-02: 200 mg via INTRAVENOUS
  Filled 2020-08-02: qty 200

## 2020-08-02 NOTE — Patient Instructions (Signed)

## 2020-08-10 ENCOUNTER — Ambulatory Visit: Payer: Medicare Other

## 2020-08-13 ENCOUNTER — Inpatient Hospital Stay: Payer: Medicare Other

## 2020-08-13 ENCOUNTER — Other Ambulatory Visit: Payer: Self-pay

## 2020-08-13 ENCOUNTER — Telehealth: Payer: Self-pay | Admitting: *Deleted

## 2020-08-13 VITALS — BP 122/78 | HR 74 | Temp 98.3°F | Resp 18

## 2020-08-13 DIAGNOSIS — D649 Anemia, unspecified: Secondary | ICD-10-CM

## 2020-08-13 DIAGNOSIS — D509 Iron deficiency anemia, unspecified: Secondary | ICD-10-CM | POA: Diagnosis not present

## 2020-08-13 MED ORDER — SODIUM CHLORIDE 0.9 % IV SOLN
Freq: Once | INTRAVENOUS | Status: AC
  Filled 2020-08-13: qty 250

## 2020-08-13 MED ORDER — SODIUM CHLORIDE 0.9 % IV SOLN
200.0000 mg | Freq: Once | INTRAVENOUS | Status: AC
  Administered 2020-08-13: 200 mg via INTRAVENOUS
  Filled 2020-08-13: qty 200

## 2020-08-13 NOTE — Patient Instructions (Signed)

## 2020-08-13 NOTE — Telephone Encounter (Signed)
Pt in office & asked to speak to RN after iron infusion.  She was concerned that she wasn't being seen till Feb.  Explained that she needs to give the iron time to work but if she is having further problems at home to call us.  She is not taking oral iron b/c it didn't agree with her.  Informed to continue eating foods high in iron. She expressed understanding.

## 2020-08-17 ENCOUNTER — Other Ambulatory Visit: Payer: Self-pay

## 2020-08-17 ENCOUNTER — Ambulatory Visit
Admission: RE | Admit: 2020-08-17 | Discharge: 2020-08-17 | Disposition: A | Discharge status: Home / Self Care | Payer: Medicare Other | Source: Ambulatory Visit | Attending: Internal Medicine | Admitting: Internal Medicine

## 2020-08-17 DIAGNOSIS — Z1231 Encounter for screening mammogram for malignant neoplasm of breast: Secondary | ICD-10-CM

## 2020-09-22 ENCOUNTER — Other Ambulatory Visit: Payer: Medicare HMO

## 2020-09-22 DIAGNOSIS — Z20822 Contact with and (suspected) exposure to covid-19: Secondary | ICD-10-CM

## 2020-09-27 LAB — NOVEL CORONAVIRUS, NAA: SARS-CoV-2, NAA: NOT DETECTED

## 2020-10-11 ENCOUNTER — Other Ambulatory Visit: Payer: Medicare HMO

## 2020-10-13 ENCOUNTER — Other Ambulatory Visit: Payer: Medicare HMO

## 2020-10-24 ENCOUNTER — Other Ambulatory Visit: Payer: Self-pay

## 2020-10-24 ENCOUNTER — Telehealth (INDEPENDENT_AMBULATORY_CARE_PROVIDER_SITE_OTHER): Payer: Medicare HMO | Admitting: Psychiatry

## 2020-10-24 DIAGNOSIS — F411 Generalized anxiety disorder: Secondary | ICD-10-CM | POA: Diagnosis not present

## 2020-10-24 DIAGNOSIS — F324 Major depressive disorder, single episode, in partial remission: Secondary | ICD-10-CM

## 2020-10-24 DIAGNOSIS — G4709 Other insomnia: Secondary | ICD-10-CM

## 2020-10-24 MED ORDER — ALPRAZOLAM 0.25 MG PO TABS: 0.2500 mg | ORAL_TABLET | Freq: Two times a day (BID) | ORAL | 4 refills | Status: AC | PRN

## 2020-10-24 MED ORDER — DULOXETINE HCL 60 MG PO CPEP: 60.0000 mg | ORAL_CAPSULE | Freq: Every day | ORAL | 5 refills | Status: AC

## 2020-10-24 MED ORDER — ALPRAZOLAM 0.25 MG PO TABS: 0.2500 mg | ORAL_TABLET | Freq: Two times a day (BID) | ORAL | 0 refills | Status: DC | PRN

## 2020-10-24 NOTE — Progress Notes (Signed)
[name] OP Progress Note  10/24/2020 4:09 PM AEDYN Church  MRN:  [id number]  Chief Complaint: Depression  HPI:     At this time the patient is doing well.  Her mood is good.  She is getting up and getting out.  She is sleeping and eating well does not use any alcohol and takes her medicine as prescribed.  She has been diagnosed with anemia and now she is on iron replacement.  She has good relationships with her kids.  The patient lives independently.  She makes her own food.  Patient still drives.  Her anxiety depression well controlled.  She denies anhedonia.  She is really doing well at this time. Past Psychiatric History: See intake H&P for full details. Reviewed, with no updates at this time.   Past Medical History:  Past Medical History:  Diagnosis Date  . Anemia   . Anxiety Dx 2007  . Arthritis   . Blood transfusion without reported diagnosis   . COPD (chronic obstructive pulmonary disease) ([place])   . Depression   . Eczema   . Hyperlipidemia Dx 2010  . Hypertension Dx 2010  . Numbness and tingling of lower extremity     Past Surgical History:  Procedure Laterality Date  . ABDOMINAL HYSTERECTOMY    . COLONOSCOPY    . FRACTURE SURGERY     Left pinky finger  . LIPOMA EXCISION    . LUMBAR LAMINECTOMY/DECOMPRESSION MICRODISCECTOMY N/A 07/03/2016   Procedure: LUMBAR FOUR - LUMBAR FIVE LAMINECTOMY/DISKECTOMY;  Surgeon: [name] [name], MD;  Location: [facility];  Service: Neurosurgery;  Laterality: N/A;  LUMBAR FOUR - LUMBAR FIVE LAMINECTOMY/DISKECTOMY  . POLYPECTOMY    . SALIVARY GLAND SURGERY    . sciatica     Dr. Sharmaine Base oct. 2017  . TIBIA FRACTURE SURGERY     right  . TOTAL HIP ARTHROPLASTY Right 02/24/2017   Procedure: RIGHT TOTAL HIP ARTHROPLASTY ANTERIOR APPROACH;  Surgeon: Paralee Cancel, MD;  Location: [facility];  Service: Orthopedics;  Laterality: Right;  . UPPER GASTROINTESTINAL ENDOSCOPY      Family Psychiatric History: See intake H&P for full details.  Reviewed, with no updates at this time.   Family History:  Family History  Problem Relation Age of Onset  . Diabetes Mother   . Diabetes Sister   . Cancer Brother        colon cancer   . Colon cancer Brother        dx in 32's  . Esophageal cancer Neg Hx   . Rectal cancer Neg Hx   . Stomach cancer Neg Hx     Social History:  Social History   Socioeconomic History  . Marital status: Single    Spouse name: Not on file  . Number of children: Not on file  . Years of education: Not on file  . Highest education level: Not on file  Occupational History  . Not on file  Tobacco Use  . Smoking status: Current Some Day Smoker    Packs/day: 0.20    Years: 30.00    Pack years: 6.00    Types: Cigarettes  . Smokeless tobacco: Never Used  . Tobacco comment: Wears Nicotine patch   Vaping Use  . Vaping Use: Never used  Substance and Sexual Activity  . Alcohol use: Yes    Alcohol/week: 0.0 standard drinks    Comment: social  . Drug use: Not Currently  . Sexual activity: Yes    Partners: Male  Other Topics  Concern  . Not on file  Social History Narrative  . Not on file   Social Determinants of Health   Financial Resource Strain: Not on file  Food Insecurity: Not on file  Transportation Needs: Not on file  Physical Activity: Not on file  Stress: Not on file  Social Connections: Not on file    Allergies:  Allergies  Allergen Reactions  . Other Other (See Comments)     Nickel "breaks my skin out" Fire ants  . Sulfa Antibiotics Swelling    Metabolic Disorder Labs: Lab Results  Component Value Date   HGBA1C 5.9 10/03/2013   No results found for: PROLACTIN Lab Results  Component Value Date   CHOL 188 10/03/2013   TRIG 267 (H) 10/03/2013   HDL 42 10/03/2013   CHOLHDL 4.5 10/03/2013   VLDL 53 (H) 10/03/2013   LDLCALC 93 10/03/2013   Lab Results  Component Value Date   TSH 1.569 05/14/2019   TSH 0.936 10/03/2013    Therapeutic Level Labs: No results found  for: LITHIUM No results found for: VALPROATE No components found for:  CBMZ  Current Medications: Current Outpatient Medications  Medication Sig Dispense Refill  . ALPRAZolam (XANAX) 0.25 MG tablet Take 1 tablet (0.25 mg total) by mouth 2 (two) times daily as needed for anxiety. 60 tablet 4  . ammonium lactate (LAC-HYDRIN) 12 % cream Apply topically as needed for dry skin. 385 g 0  . Biotin 1000 MCG tablet Take 1,000 mcg by mouth daily.     . DULoxetine (CYMBALTA) 60 MG capsule Take 1 capsule (60 mg total) by mouth daily. 1 qam 30 capsule 5  . hydroquinone 4 % cream Apply topically 2 (two) times daily. To dark areas of face. Use of SPF 30 or 50 sunscreen moisturizer 28.35 g 0  . metoprolol tartrate (LOPRESSOR) 50 MG tablet Take 1 tablet (50 mg total) by mouth 2 (two) times daily. 60 tablet 11  . Misc. Devices (CANE) MISC 1 each by Does not apply route daily. 1 each 0  . Misc. Devices (HUGO ROLLING WALKER BASIC) MISC 1 each by Does not apply route daily. 1 each 0  . nicotine (NICODERM CQ - DOSED IN MG/24 HOURS) 14 mg/24hr patch Place 1 patch (14 mg total) onto the skin daily. 28 patch 0  . nitroGLYCERIN (NITROSTAT) 0.4 MG SL tablet See admin instructions. (Patient not taking: Reported on 07/19/2020)    . OVER THE COUNTER MEDICATION Take 3 capsules by mouth 2 (two) times daily. Nature's Way Energizing Iron (Red Blood Cell Support Blend)    . oxyCODONE-acetaminophen (PERCOCET/ROXICET) 5-325 MG tablet Take 1 tablet by mouth daily. None after 6 PM.    . pantoprazole (PROTONIX) 40 MG tablet Take 40 mg by mouth 2 (two) times daily.    Marland Kitchen triamcinolone ointment (KENALOG) 0.5 % APPLY 1 APPLICATION TOPICALLY 2 TIMES DAILY. APPLY TO HANDS 30 g 2  . vitamin C (ASCORBIC ACID) 250 MG tablet Take 1 tablet (250 mg total) by mouth daily. 30 tablet 3   Current Facility-Administered Medications  Medication Dose Route Frequency Provider Last Rate Last Admin  . 0.9 %  sodium chloride infusion  500 mL Intravenous  Once Milus Banister, MD         Musculoskeletal: Strength & Muscle Tone: decreased Gait & Station: unsteady, shuffle Patient leans: N/A  Psychiatric Specialty Exam: ROS  There were no vitals taken for this visit.There is no height or weight on file to calculate BMI.  General Appearance: Casual and Fairly Groomed  Eye Contact:  Fair  Speech:  Clear and Coherent, Normal Rate and wears dentures  Volume:  Normal  Mood:  Dysphoric  Affect:  Congruent  Thought Process:  Goal Directed and Descriptions of Associations: Intact  Orientation:  Full (Time, Place, and Person)  Thought Content: Logical   Suicidal Thoughts:  No  Homicidal Thoughts:  No  Memory:  Immediate;   Fair  Judgement:  Fair  Insight:  Fair  Psychomotor Activity:  Normal  Concentration:  Concentration: Poor  Recall:  [employer] of Knowledge: Fair  Language: Fair  Akathisia:  Negative  Handed:  Right  AIMS (if indicated): not done  Assets:  Communication Skills Desire for Improvement Financial Resources/Insurance Housing  ADL's:  Intact  Cognition: WNL  Sleep:  Poor   Screenings: GAD-7   Physiological scientist Office Visit from 04/13/2017 in [facility] Office Visit from 11/28/2016 in [facility] Office Visit from 11/14/2016 in [facility] Office Visit from 09/02/2016 in [facility] Office Visit from 08/15/2016 in [facility]  Total GAD-7 Score 18 18 20 19 20     PHQ2-9   Flowsheet Row Counselor from 02/10/2018 in [facility] Counselor from 01/26/2018 in [facility] Office Visit from 04/13/2017 in [facility] Office Visit from 11/28/2016 in [facility] Office Visit from 11/14/2016 in [facility]  PHQ-2 Total Score 6 6 6 5 6    PHQ-9 Total Score 16 17 18 17 21        Assessment and Plan:   This patient's first problem is that of generalized anxiety disorder.  She will continue taking Cymbalta for that condition.  She also takes a small dose of Xanax one or two a day.  Patient is functioning very well.  She can concentrate well.  She is not really having very much fatigue.  She is got energy.  Her anxiety is significantly impaired improved.  Her medications clearly are helpful.  Patient be seen again in about 4 months.. 1. Other insomnia   2. GAD (generalized anxiety disorder)     Status of current problems: unchanged  Labs Ordered: No orders of the defined types were placed in this encounter.   Labs Reviewed: n/a  Collateral Obtained/Records Reviewed: n/a  Plan:  Elavil discontinued Xanax 0.25 mg BID prn anxiety/sleep Turn to clinic in 12 weeks

## 2020-10-24 NOTE — Progress Notes (Signed)
[facility] MD/PA/NP OP Progress Note  10/24/2020 4:09 PM Meredith Church  MRN:  [id number]  Chief Complaint: Depression  HPI:    Past Psychiatric History: See intake H&P for full details. Reviewed, with no updates at this time. Today the patient is not feeling all that well.  It seems to be more of a medical problem.  She sees her primary care doctor tomorrow.  The patient has been treated for anemia.  Her blood work actually looks better.  She says her discomfort is been going on for just 1 to 2 weeks.  It seems to be correlated with the weather.  There is been snowing and icy is been hard to get out.  She does not seem to be bothered all that much 5 coronavirus.  The patient says actually she is sleeping and eating fairly well.  She is got a reasonable amount of energy.  She denies chest pain or shortness of breath.  Patient would like to get back to exercising but she does not.  She likes where she lives.  She does all her basic ADLs.  She continues to drive.  She really does not have any vegetative symptoms at all.  She denies anxiety.  She is spiritual and praise a lot.  Patient lives alone.  She continues taking Cymbalta and a small dose of Xanax which serves her well.  I think her sense of being sick might be related to her physical issues.  It might also be related to isolation from the weather.  Patient overall though continues to function pretty well.  She denies worsening persistent depression. Past Medical History:  Past Medical History:  Diagnosis Date  . Anemia   . Anxiety Dx 2007  . Arthritis   . Blood transfusion without reported diagnosis   . COPD (chronic obstructive pulmonary disease) ([place])   . Depression   . Eczema   . Hyperlipidemia Dx 2010  . Hypertension Dx 2010  . Numbness and tingling of lower extremity     Past Surgical History:  Procedure Laterality Date  . ABDOMINAL HYSTERECTOMY    . COLONOSCOPY    . FRACTURE SURGERY     Left pinky finger  . LIPOMA EXCISION    .  LUMBAR LAMINECTOMY/DECOMPRESSION MICRODISCECTOMY N/A 07/03/2016   Procedure: LUMBAR FOUR - LUMBAR FIVE LAMINECTOMY/DISKECTOMY;  Surgeon: [name] [name], MD;  Location: [facility];  Service: Neurosurgery;  Laterality: N/A;  LUMBAR FOUR - LUMBAR FIVE LAMINECTOMY/DISKECTOMY  . POLYPECTOMY    . SALIVARY GLAND SURGERY    . sciatica     Dr. Sharmaine Base oct. 2017  . TIBIA FRACTURE SURGERY     right  . TOTAL HIP ARTHROPLASTY Right 02/24/2017   Procedure: RIGHT TOTAL HIP ARTHROPLASTY ANTERIOR APPROACH;  Surgeon: Paralee Cancel, MD;  [facility];  Service: Orthopedics;  Laterality: Right;  . UPPER GASTROINTESTINAL ENDOSCOPY      Family Psychiatric History: See intake H&P for full details. Reviewed, with no updates at this time.   Family History:  Family History  Problem Relation Age of Onset  . Diabetes Mother   . Diabetes Sister   . Cancer Brother        colon cancer   . Colon cancer Brother        dx in 59's  . Esophageal cancer Neg Hx   . Rectal cancer Neg Hx   . Stomach cancer Neg Hx     Social History:  Social History   Socioeconomic History  . Marital  status: Single    Spouse name: Not on file  . Number of children: Not on file  . Years of education: Not on file  . Highest education level: Not on file  Occupational History  . Not on file  Tobacco Use  . Smoking status: Current Some Day Smoker    Packs/day: 0.20    Years: 30.00    Pack years: 6.00    Types: Cigarettes  . Smokeless tobacco: Never Used  . Tobacco comment: Wears Nicotine patch   Vaping Use  . Vaping Use: Never used  Substance and Sexual Activity  . Alcohol use: Yes    Alcohol/week: 0.0 standard drinks    Comment: social  . Drug use: Not Currently  . Sexual activity: Yes    Partners: Male  Other Topics Concern  . Not on file  Social History Narrative  . Not on file   Social Determinants of Health   Financial Resource Strain: Not on file  Food Insecurity: Not on file  Transportation Needs: Not  on file  Physical Activity: Not on file  Stress: Not on file  Social Connections: Not on file    Allergies:  Allergies  Allergen Reactions  . Other Other (See Comments)     Nickel "breaks my skin out" Fire ants  . Sulfa Antibiotics Swelling    Metabolic Disorder Labs: Lab Results  Component Value Date   HGBA1C 5.9 10/03/2013   No results found for: PROLACTIN Lab Results  Component Value Date   CHOL 188 10/03/2013   TRIG 267 (H) 10/03/2013   HDL 42 10/03/2013   CHOLHDL 4.5 10/03/2013   VLDL 53 (H) 10/03/2013   LDLCALC 93 10/03/2013   Lab Results  Component Value Date   TSH 1.569 05/14/2019   TSH 0.936 10/03/2013    Therapeutic Level Labs: No results found for: LITHIUM No results found for: VALPROATE No components found for:  CBMZ  Current Medications: Current Outpatient Medications  Medication Sig Dispense Refill  . ALPRAZolam (XANAX) 0.25 MG tablet Take 1 tablet (0.25 mg total) by mouth 2 (two) times daily as needed for anxiety. 60 tablet 4  . ammonium lactate (LAC-HYDRIN) 12 % cream Apply topically as needed for dry skin. 385 g 0  . Biotin 1000 MCG tablet Take 1,000 mcg by mouth daily.     . DULoxetine (CYMBALTA) 60 MG capsule Take 1 capsule (60 mg total) by mouth daily. 1 qam 30 capsule 5  . hydroquinone 4 % cream Apply topically 2 (two) times daily. To dark areas of face. Use of SPF 30 or 50 sunscreen moisturizer 28.35 g 0  . metoprolol tartrate (LOPRESSOR) 50 MG tablet Take 1 tablet (50 mg total) by mouth 2 (two) times daily. 60 tablet 11  . Misc. Devices (CANE) MISC 1 each by Does not apply route daily. 1 each 0  . Misc. Devices (HUGO ROLLING WALKER BASIC) MISC 1 each by Does not apply route daily. 1 each 0  . nicotine (NICODERM CQ - DOSED IN MG/24 HOURS) 14 mg/24hr patch Place 1 patch (14 mg total) onto the skin daily. 28 patch 0  . nitroGLYCERIN (NITROSTAT) 0.4 MG SL tablet See admin instructions. (Patient not taking: Reported on 07/19/2020)    . OVER THE  COUNTER MEDICATION Take 3 capsules by mouth 2 (two) times daily. Nature's Way Energizing Iron (Red Blood Cell Support Blend)    . oxyCODONE-acetaminophen (PERCOCET/ROXICET) 5-325 MG tablet Take 1 tablet by mouth daily. None after 6 PM.    .  pantoprazole (PROTONIX) 40 MG tablet Take 40 mg by mouth 2 (two) times daily.    Marland Kitchen triamcinolone ointment (KENALOG) 0.5 % APPLY 1 APPLICATION TOPICALLY 2 TIMES DAILY. APPLY TO HANDS 30 g 2  . vitamin C (ASCORBIC ACID) 250 MG tablet Take 1 tablet (250 mg total) by mouth daily. 30 tablet 3   Current Facility-Administered Medications  Medication Dose Route Frequency Provider Last Rate Last Admin  . 0.9 %  sodium chloride infusion  500 mL Intravenous Once Milus Banister, MD         Musculoskeletal: Strength & Muscle Tone: decreased Gait & Station: unsteady, shuffle Patient leans: N/A  Psychiatric Specialty Exam: ROS  There were no vitals taken for this visit.There is no height or weight on file to calculate BMI.  General Appearance: Casual and Fairly Groomed  Eye Contact:  Fair  Speech:  Clear and Coherent, Normal Rate and wears dentures  Volume:  Normal  Mood:  Dysphoric  Affect:  Congruent  Thought Process:  Goal Directed and Descriptions of Associations: Intact  Orientation:  Full (Time, Place, and Person)  Thought Content: Logical   Suicidal Thoughts:  No  Homicidal Thoughts:  No  Memory:  Immediate;   Fair  Judgement:  Fair  Insight:  Fair  Psychomotor Activity:  Normal  Concentration:  Concentration: Poor  Recall:  [employer] of Knowledge: Fair  Language: Fair  Akathisia:  Negative  Handed:  Right  AIMS (if indicated): not done  Assets:  Communication Skills Desire for Improvement Financial Resources/Insurance Housing  ADL's:  Intact  Cognition: WNL  Sleep:  Poor     Assessment/plan  At this time the patient is #1 problem seems to be major depression.  She is doing well taking Cymbalta.  Xanax point 5 mg 1 or 2 also seems  to be helpful.  Overall she is functioning fairly well.  I look forward to seeing her in the office in about 3 months.  She will be seeing her primary care doctor tomorrow around some medical issues.  Overall this patient is at her baseline. Screenings: GAD-7   Flowsheet Row Office Visit from 04/13/2017 in [facility] Office Visit from 11/28/2016 in [facility] Office Visit from 11/14/2016 in [facility] Office Visit from 09/02/2016 in [facility] Office Visit from 08/15/2016 in [facility]  Total GAD-7 Score 18 18 20 19 20     PHQ2-9   Flowsheet Row Counselor from 02/10/2018 in [facility] Counselor from 01/26/2018 in [facility] Office Visit from 04/13/2017 in [facility] Office Visit from 11/28/2016 in [facility] Office Visit from 11/14/2016 in [facility]  PHQ-2 Total Score 6 6 6 5 6   PHQ-9 Total Score 16 17 18 17 21         1. Other insomnia   2. GAD (generalized anxiety disorder)     Status of current problems: unchanged  Labs Ordered: No orders of the defined types were placed in this encounter.   Labs Reviewed: n/a  Collateral Obtained/Records Reviewed: n/a  Plan:  Elavil discontinued Xanax 0.25 mg BID prn anxiety/sleep Turn to clinic in 12 weeks

## 2020-10-25 ENCOUNTER — Telehealth: Payer: Self-pay

## 2020-10-25 ENCOUNTER — Other Ambulatory Visit: Payer: Self-pay

## 2020-10-25 ENCOUNTER — Inpatient Hospital Stay: Payer: Medicare HMO | Attending: Hematology and Oncology | Admitting: Hematology and Oncology

## 2020-10-25 ENCOUNTER — Inpatient Hospital Stay: Payer: Medicare HMO

## 2020-10-25 VITALS — BP 136/77 | HR 108 | Temp 97.7°F | Resp 19 | Ht 64.0 in | Wt 171.2 lb

## 2020-10-25 DIAGNOSIS — D509 Iron deficiency anemia, unspecified: Secondary | ICD-10-CM | POA: Diagnosis not present

## 2020-10-25 DIAGNOSIS — D649 Anemia, unspecified: Secondary | ICD-10-CM | POA: Diagnosis not present

## 2020-10-25 DIAGNOSIS — I469 Cardiac arrest, cause unspecified: Secondary | ICD-10-CM | POA: Diagnosis not present

## 2020-10-25 DIAGNOSIS — D508 Other iron deficiency anemias: Secondary | ICD-10-CM

## 2020-10-25 LAB — CMP (CANCER CENTER ONLY)
ALT: 7 U/L (ref 0–44)
AST: 14 U/L — ABNORMAL LOW (ref 15–41)
Albumin: 3.7 g/dL (ref 3.5–5.0)
Alkaline Phosphatase: 99 U/L (ref 38–126)
Anion gap: 9 (ref 5–15)
BUN: 7 mg/dL — ABNORMAL LOW (ref 8–23)
CO2: 26 mmol/L (ref 22–32)
Calcium: 8.8 mg/dL — ABNORMAL LOW (ref 8.9–10.3)
Chloride: 104 mmol/L (ref 98–111)
Creatinine: 0.65 mg/dL (ref 0.44–1.00)
GFR, Estimated: >60 mL/min (ref >60)
Glucose, Bld: 92 mg/dL (ref 70–99)
Potassium: 3.2 mmol/L — ABNORMAL LOW (ref 3.5–5.1)
Sodium: 139 mmol/L (ref 135–145)
Total Bilirubin: 0.4 mg/dL (ref 0.3–1.2)
Total Protein: 7.8 g/dL (ref 6.5–8.1)

## 2020-10-25 LAB — CBC WITH DIFFERENTIAL/PLATELET
Abs Immature Granulocytes: 0.06 10*3/uL (ref 0.00–0.07)
Basophils Absolute: 0.1 10*3/uL (ref 0.0–0.1)
Basophils Relative: 1 %
Eosinophils Absolute: 0.2 10*3/uL (ref 0.0–0.5)
Eosinophils Relative: 2 %
HCT: 27.8 % — ABNORMAL LOW (ref 36.0–46.0)
Hemoglobin: 7.7 g/dL — ABNORMAL LOW (ref 12.0–15.0)
Immature Granulocytes: 1 %
Lymphocytes Relative: 24 %
Lymphs Abs: 2.1 10*3/uL (ref 0.7–4.0)
MCH: 25.8 pg — ABNORMAL LOW (ref 26.0–34.0)
MCHC: 27.7 g/dL — ABNORMAL LOW (ref 30.0–36.0)
MCV: 93.3 fL (ref 80.0–100.0)
Monocytes Absolute: 0.8 10*3/uL (ref 0.1–1.0)
Monocytes Relative: 9 %
Neutro Abs: 5.4 10*3/uL (ref 1.7–7.7)
Neutrophils Relative %: 63 %
Platelets: 278 10*3/uL (ref 150–400)
RBC: 2.98 MIL/uL — ABNORMAL LOW (ref 3.87–5.11)
RDW: 18.4 % — ABNORMAL HIGH (ref 11.5–15.5)
WBC: 8.6 10*3/uL (ref 4.0–10.5)
nRBC: 0.8 % — ABNORMAL HIGH (ref 0.0–0.2)

## 2020-10-25 NOTE — Progress Notes (Signed)
[facility] CONSULT NOTE   Patient Care Team: Nolene Ebbs, MD as PCP - General (Internal Medicine)  CHIEF COMPLAINTS/PURPOSE OF CONSULTATION:  Chronic anemia  HISTORY OF PRESENTING ILLNESS:  Meredith Church 65 y.o. female is here because of chronic anemia.  Pt went to her PCP for FU. She had some routine labs which demonstrated anemia and hence referred to hematology.  According to the patient, last year she had severe anemia and required a blood transfusion and also received a dose of intravenous iron following which she took oral iron for about a couple months and when she felt better she discontinued it.  Recently she started noticing that she is tired easily, has more fatigue and has been craving ice.  She reported this to her primary care physician which prompted some labs.  She was noted to have iron deficiency again, had endoscopy and colonoscopy as well in September 2020 and referred to hematology. Ms. Zipper arrived to the appointment today by herself.  She tells me that she got a little anxious when she did not see my name on the board.  She is a good historian.  She tells me that she has been feeling more tired, has noticed some chest pain which she describes as a feeling that she needs to burp and this resolves after she burps, shortness of breath, not able to do all the chores like she was and a lot of craving for ice in the past few months.  She went to her doctor who recommended iron supplementation, ferrous sulfate, 65 mg twice a day which she started taking in June July, she took this for a couple months and she had such severe nausea and switch to some other formulary of iron which she obtained at health store which had about 8 mg of elemental iron.  She started taking 3 tablets of this in the a.m. and 3 tablets of this in p.m. She absolutely denies any hematochezia or melena.  She denies any hematuria.  No vaginal bleeding.  No new medications.  No recent surgeries.   No family history of anemia. She has been intermittently low on iron for several years now. She has required iron and blood transfusion as well as oral iron supplementation from time to time. She had endoscopy and colonoscopy in September 2020 which revealed sessile polyps, nonmalignant but otherwise no major pathology that could explain the iron deficiency anemia.  Family history noted for colon cancer in her brother, diagnosed in 25s and alive at the time.   Interim History  She is here for a FU. In the last 2/3 weeks she feels beat, very tired, complains of gas pain in her chest. She says she know this is gas pains because the chest pain gets better when she belches, but she is also worried if there is cardiac etiology She denies any blood in her stool or melena. She denies any new onset SOB. She is eating ice again,she think iron numbers are low again  Rest of the pertinent 10 point ROS reviewed and negative.  MEDICAL HISTORY:  Past Medical History:  Diagnosis Date   Anemia    Anxiety Dx 2007   Arthritis    Blood transfusion without reported diagnosis    COPD (chronic obstructive pulmonary disease) ([place])    Depression    Eczema    Hyperlipidemia Dx 2010   Hypertension Dx 2010   Numbness and tingling of lower extremity     SURGICAL HISTORY: Past  Surgical History:  Procedure Laterality Date   ABDOMINAL HYSTERECTOMY     COLONOSCOPY     FRACTURE SURGERY     Left pinky finger   LIPOMA EXCISION     LUMBAR LAMINECTOMY/DECOMPRESSION MICRODISCECTOMY N/A 07/03/2016   Procedure: LUMBAR FOUR - LUMBAR FIVE LAMINECTOMY/DISKECTOMY;  Surgeon: [name] [name], MD;  Location: [facility];  Service: Neurosurgery;  Laterality: N/A;  LUMBAR FOUR - LUMBAR FIVE LAMINECTOMY/DISKECTOMY   POLYPECTOMY     SALIVARY GLAND SURGERY     sciatica     Dr. Sharmaine Base oct. 2017   TIBIA FRACTURE SURGERY     right   TOTAL HIP ARTHROPLASTY Right 02/24/2017   Procedure: RIGHT TOTAL HIP  ARTHROPLASTY ANTERIOR APPROACH;  Surgeon: Paralee Cancel, MD;  Location: [facility];  Service: Orthopedics;  Laterality: Right;   UPPER GASTROINTESTINAL ENDOSCOPY      SOCIAL HISTORY: Social History   Socioeconomic History   Marital status: Single    Spouse name: Not on file   Number of children: Not on file   Years of education: Not on file   Highest education level: Not on file  Occupational History   Not on file  Tobacco Use   Smoking status: Current Some Day Smoker    Packs/day: 0.20    Years: 30.00    Pack years: 6.00    Types: Cigarettes   Smokeless tobacco: Never Used   Tobacco comment: Wears Nicotine patch   Vaping Use   Vaping Use: Never used  Substance and Sexual Activity   Alcohol use: Yes    Alcohol/week: 0.0 standard drinks    Comment: social   Drug use: Not Currently   Sexual activity: Yes    Partners: Male  Other Topics Concern   Not on file  Social History Narrative   Not on file   Social Determinants of Health   Financial Resource Strain: Not on file  Food Insecurity: Not on file  Transportation Needs: Not on file  Physical Activity: Not on file  Stress: Not on file  Social Connections: Not on file  Intimate Partner Violence: Not on file    FAMILY HISTORY: Family History  Problem Relation Age of Onset   Diabetes Mother    Diabetes Sister    Cancer Brother        colon cancer    Colon cancer Brother        dx in 67's   Esophageal cancer Neg Hx    Rectal cancer Neg Hx    Stomach cancer Neg Hx     ALLERGIES:  is allergic to other and sulfa antibiotics.  MEDICATIONS:  Current Outpatient Medications  Medication Sig Dispense Refill   ALPRAZolam (XANAX) 0.25 MG tablet Take 1 tablet (0.25 mg total) by mouth 2 (two) times daily as needed for anxiety. 60 tablet 4   ammonium lactate (LAC-HYDRIN) 12 % cream Apply topically as needed for dry skin. 385 g 0   Biotin 1000 MCG tablet Take 1,000 mcg by mouth daily.       DULoxetine (CYMBALTA) 60 MG capsule Take 1 capsule (60 mg total) by mouth daily. 1 qam 30 capsule 5   hydroquinone 4 % cream Apply topically 2 (two) times daily. To dark areas of face. Use of SPF 30 or 50 sunscreen moisturizer 28.35 g 0   metoprolol tartrate (LOPRESSOR) 50 MG tablet Take 1 tablet (50 mg total) by mouth 2 (two) times daily. 60 tablet 11   Misc. Devices (CANE) MISC 1 each by Does  not apply route daily. 1 each 0   Misc. Devices (HUGO ROLLING WALKER BASIC) MISC 1 each by Does not apply route daily. 1 each 0   nicotine (NICODERM CQ - DOSED IN MG/24 HOURS) 14 mg/24hr patch Place 1 patch (14 mg total) onto the skin daily. 28 patch 0   nitroGLYCERIN (NITROSTAT) 0.4 MG SL tablet See admin instructions. (Patient not taking: Reported on 07/19/2020)     OVER THE COUNTER MEDICATION Take 3 capsules by mouth 2 (two) times daily. [facility] [facility] Energizing Iron (Red Blood Cell Support Blend)     oxyCODONE-acetaminophen (PERCOCET/ROXICET) 5-325 MG tablet Take 1 tablet by mouth daily. None after 6 PM.     pantoprazole (PROTONIX) 40 MG tablet Take 40 mg by mouth 2 (two) times daily.     triamcinolone ointment (KENALOG) 0.5 % APPLY 1 APPLICATION TOPICALLY 2 TIMES DAILY. APPLY TO HANDS 30 g 2   vitamin C (ASCORBIC ACID) 250 MG tablet Take 1 tablet (250 mg total) by mouth daily. 30 tablet 3   Current Facility-Administered Medications  Medication Dose Route Frequency Provider Last Rate Last Admin   0.9 %  sodium chloride infusion  500 mL Intravenous Once Milus Banister, MD        PHYSICAL EXAMINATION: ECOG PERFORMANCE STATUS: 1 - Symptomatic but completely ambulatory  Vitals:   10/25/20 1418  BP: 136/77  Pulse: (!) 108  Resp: 19  Temp: 97.7 F (36.5 C)  SpO2: 93%   Filed Weights   10/25/20 1418  Weight: 171 lb 3.2 oz (77.7 kg)    GENERAL:alert, no distress and comfortable SKIN: skin color, texture, turgor are normal, no rashes or significant lesions EYES: normal, conjunctiva  non-injected, sclera clear, slight pallor OROPHARYNX:no exudate, no erythema and lips, buccal mucosa, and tongue normal  NECK: supple, nontender. LYMPH:  no palpable lymphadenopathy in the cervical, axillary or inguinal LUNGS: clear to auscultation and percussion with normal breathing effort HEART: regular rate & rhythm and no murmurs and no lower extremity edema ABDOMEN:abdomen soft, non-tender and normal bowel sounds.  No hepatosplenomegaly. Musculoskeletal:no cyanosis of digits and no clubbing.  Trace edema bilateral lower extremities, symmetrical. PSYCH: alert & oriented x 3 with fluent speech NEURO: no focal motor/sensory deficits  LABORATORY DATA:  I have reviewed the data as listed Lab Results  Component Value Date   WBC 6.8 05/31/2019   HGB 12.2 05/31/2019   HCT 39.4 05/31/2019   MCV 92.1 05/31/2019   PLT 369.0 05/31/2019     Chemistry      Component Value Date/Time   NA 141 05/15/2019 0458   K 3.4 (L) 05/15/2019 0458   CL 106 05/15/2019 0458   CO2 28 05/15/2019 0458   BUN 9 05/15/2019 0458   CREATININE 0.63 05/15/2019 0458   CREATININE 0.73 07/12/2015 1604      Component Value Date/Time   CALCIUM 8.9 05/15/2019 0458   ALKPHOS 84 05/14/2019 0828   AST 17 05/14/2019 0828   ALT 12 05/14/2019 0828   BILITOT 0.9 05/14/2019 0828     Reviewed outside labs Iron panel and ferritin suggestive of IDA, ferritin of 17 and TIBC over 400  MCV is 95. 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 Last endoscopy and colonoscopy 2020 reviewed, sessile polyps otherwise no clear source of bleeding. Reviewed labs over the past few yrs, which showed intermittent macrocytosis and anemia. Surg path from colonoscopy negative for dysplasia or malignancy.  RADIOGRAPHIC STUDIES: I  have personally reviewed the radiological images as listed and agreed with the findings in the report. No results found.  ASSESSMENT & PLAN:    This is a very pleasant 65 year old African-American female patient with past medical history significant for hypertension, dyslipidemia, arthritis, iron deficiency anemia referred to hematology for further recommendations regarding ongoing iron deficiency anemia. Less likely primary bone marrow disorder since there is clear evidence of IDA Last endoscopy and colonoscopy with no clear evidence of blood loss Discussed with Dr Ardis Hughs if capsule endoscopy is an option. He kindly replied back that they will evaluate her in the clinic soon. We have repeated labs yesterday, waiting for results CBC showed severe drop in hemoglobin, will arrange for transfusion and possibly iron infusion. She is agreeable to this plan Also sent a referral to cardiology for evaluation of possible cardiac etiology for the chest pain. Discussed possible adverse effects of blood transfusion, she is agreeable to proceeding with blood transfusion. Type and screen orders, informed consent obtained over the phone.  All questions were answered. The patient knows to call the clinic with any problems, questions or concerns.  I spent 30 minutes in the care of this patient including H and P, review of records, counseling and coordination of care. Discussed with Dr Ardis Hughs from GI as well   Benay Pike, MD 10/25/2020 2:28 PM

## 2020-10-25 NOTE — Telephone Encounter (Signed)
-----   Message from Milus Banister, MD sent at 10/25/2020  2:47 PM EST -----  Thanks, yes We were definitely heading in that direction as off last contact in 2020. She was supposed to turn in some FOBT cards.  We'll contact her about follow up in our office.   Taisa Deloria, She needs OV with me first available. Can you send her two sets of fobt cards to complete asap and she also needs a cbc a few days prior to the [place].  Thanks  ----- Message ----- From: Benay Pike, MD Sent: 10/25/2020   2:37 PM EST To: Milus Banister, MD  Dr Ardis Hughs  Can we consider a capsule endoscopy for this patient.  Thanks,

## 2020-10-26 ENCOUNTER — Telehealth: Payer: Self-pay

## 2020-10-26 ENCOUNTER — Encounter: Payer: Self-pay | Admitting: Hematology and Oncology

## 2020-10-26 ENCOUNTER — Other Ambulatory Visit: Payer: Self-pay | Admitting: Hematology and Oncology

## 2020-10-26 ENCOUNTER — Other Ambulatory Visit: Payer: Self-pay

## 2020-10-26 ENCOUNTER — Telehealth: Payer: Self-pay | Admitting: Hematology and Oncology

## 2020-10-26 ENCOUNTER — Inpatient Hospital Stay: Payer: Medicare HMO

## 2020-10-26 DIAGNOSIS — D649 Anemia, unspecified: Secondary | ICD-10-CM

## 2020-10-26 DIAGNOSIS — D509 Iron deficiency anemia, unspecified: Secondary | ICD-10-CM

## 2020-10-26 LAB — CBC WITH DIFFERENTIAL/PLATELET
Abs Immature Granulocytes: 0.06 10*3/uL (ref 0.00–0.07)
Basophils Absolute: 0.1 10*3/uL (ref 0.0–0.1)
Basophils Relative: 1 %
Eosinophils Absolute: 0.2 10*3/uL (ref 0.0–0.5)
Eosinophils Relative: 2 %
HCT: 30.1 % — ABNORMAL LOW (ref 36.0–46.0)
Hemoglobin: 8.3 g/dL — ABNORMAL LOW (ref 12.0–15.0)
Immature Granulocytes: 1 %
Lymphocytes Relative: 19 %
Lymphs Abs: 1.9 10*3/uL (ref 0.7–4.0)
MCH: 25.9 pg — ABNORMAL LOW (ref 26.0–34.0)
MCHC: 27.6 g/dL — ABNORMAL LOW (ref 30.0–36.0)
MCV: 94.1 fL (ref 80.0–100.0)
Monocytes Absolute: 0.8 10*3/uL (ref 0.1–1.0)
Monocytes Relative: 8 %
Neutro Abs: 6.8 10*3/uL (ref 1.7–7.7)
Neutrophils Relative %: 69 %
Platelets: 279 10*3/uL (ref 150–400)
RBC: 3.2 MIL/uL — ABNORMAL LOW (ref 3.87–5.11)
RDW: 19.7 % — ABNORMAL HIGH (ref 11.5–15.5)
WBC: 9.9 10*3/uL (ref 4.0–10.5)
nRBC: 0.9 % — ABNORMAL HIGH (ref 0.0–0.2)

## 2020-10-26 LAB — FERRITIN: Ferritin: 94 ng/mL (ref 11–307)

## 2020-10-26 LAB — IRON AND TIBC
Iron: 234 ug/dL — ABNORMAL HIGH (ref 41–142)
Saturation Ratios: 57 % (ref 21–57)
TIBC: 414 ug/dL (ref 236–444)
UIBC: 180 ug/dL (ref 120–384)

## 2020-10-26 LAB — PREPARE RBC (CROSSMATCH)

## 2020-10-26 NOTE — Progress Notes (Signed)
I called to confirm with blood bank that they could see the blood orders. They did confirm that they could see the orders and that they would start working on it soon. The Patient was notified several times to leave her blue wrist band on and she verbalized understanding.

## 2020-10-26 NOTE — Telephone Encounter (Signed)
-----   Message from Benay Pike, MD sent at 10/26/2020  8:42 AM EST ----- Meredith Church, her hemoglobin has dropped quite a bit. I recommend blood transfusion 1 unit PRBC. Then Let her know that I spoke to her gastroenterologist and they should be arranging to see her back soon> Iron numbers are pending. Please take care of the orders for blood transfusion. Discuss possible complications of blood transfusion and take an oral consent.

## 2020-10-26 NOTE — Telephone Encounter (Signed)
Appt made for 12/18/20 at 910 am for appt with Dr Ardis Hughs.  Stool cards and appt have been mailed to the pt.  Pt labs have been entered to be completed prior to the appt.  Pt aware.

## 2020-10-26 NOTE — Telephone Encounter (Signed)
Scheduled appointments per 2/11 inbasket msg from Dr. Chryl Heck. Spoke to patient who is aware of appointments dates and times.

## 2020-10-26 NOTE — Telephone Encounter (Signed)
The patient was made aware of her lab results and verbalized understanding. The patient states that she has received blood transfusions before so she is aware of the process and feels comfortable having this procedure done again. We touched base on the benefits and risks of blood transfusions. The patient gave me oral consent to place the orders in for her transfusion. I made sure to communicate thoroughly to the patient to leave her blue wristband on so that she could receive the blood on 2/14.

## 2020-10-26 NOTE — Progress Notes (Unsigned)
Called patient back. I told her we will arrange for blood transfusion as soon as possible but if she feels like she cant wait till Monday, she can go to the ER. She continues to have recurrent anemia, iron panel is certainly better. Discussed with Dr Ardis Hughs, he will arrange for follow up. Will also proceed with BMB, ordered. RTC in 4 weeks to review labs and to discuss recommendations

## 2020-10-28 ENCOUNTER — Inpatient Hospital Stay (HOSPITAL_COMMUNITY): Payer: Medicare HMO

## 2020-10-28 ENCOUNTER — Emergency Department (HOSPITAL_COMMUNITY): Payer: Medicare HMO

## 2020-10-28 ENCOUNTER — Inpatient Hospital Stay (HOSPITAL_COMMUNITY)
Admission: EM | Admit: 2020-10-28 | Discharge: 2020-11-13 | DRG: 296 | Disposition: E | Payer: Medicare HMO | Attending: Pulmonary Disease | Admitting: Pulmonary Disease

## 2020-10-28 DIAGNOSIS — E876 Hypokalemia: Secondary | ICD-10-CM | POA: Diagnosis present

## 2020-10-28 DIAGNOSIS — Z66 Do not resuscitate: Secondary | ICD-10-CM | POA: Diagnosis not present

## 2020-10-28 DIAGNOSIS — E875 Hyperkalemia: Secondary | ICD-10-CM | POA: Diagnosis present

## 2020-10-28 DIAGNOSIS — K7201 Acute and subacute hepatic failure with coma: Secondary | ICD-10-CM | POA: Diagnosis present

## 2020-10-28 DIAGNOSIS — Z978 Presence of other specified devices: Secondary | ICD-10-CM

## 2020-10-28 DIAGNOSIS — R69 Illness, unspecified: Secondary | ICD-10-CM

## 2020-10-28 DIAGNOSIS — Z882 Allergy status to sulfonamides status: Secondary | ICD-10-CM

## 2020-10-28 DIAGNOSIS — G931 Anoxic brain damage, not elsewhere classified: Secondary | ICD-10-CM | POA: Diagnosis present

## 2020-10-28 DIAGNOSIS — F419 Anxiety disorder, unspecified: Secondary | ICD-10-CM | POA: Diagnosis present

## 2020-10-28 DIAGNOSIS — Z20822 Contact with and (suspected) exposure to covid-19: Secondary | ICD-10-CM | POA: Diagnosis present

## 2020-10-28 DIAGNOSIS — Z96641 Presence of right artificial hip joint: Secondary | ICD-10-CM | POA: Diagnosis present

## 2020-10-28 DIAGNOSIS — D539 Nutritional anemia, unspecified: Secondary | ICD-10-CM

## 2020-10-28 DIAGNOSIS — J9602 Acute respiratory failure with hypercapnia: Secondary | ICD-10-CM | POA: Diagnosis present

## 2020-10-28 DIAGNOSIS — Z789 Other specified health status: Secondary | ICD-10-CM

## 2020-10-28 DIAGNOSIS — Z4659 Encounter for fitting and adjustment of other gastrointestinal appliance and device: Secondary | ICD-10-CM

## 2020-10-28 DIAGNOSIS — I10 Essential (primary) hypertension: Secondary | ICD-10-CM | POA: Diagnosis present

## 2020-10-28 DIAGNOSIS — I2510 Atherosclerotic heart disease of native coronary artery without angina pectoris: Secondary | ICD-10-CM | POA: Diagnosis present

## 2020-10-28 DIAGNOSIS — I469 Cardiac arrest, cause unspecified: Secondary | ICD-10-CM | POA: Diagnosis present

## 2020-10-28 DIAGNOSIS — R739 Hyperglycemia, unspecified: Secondary | ICD-10-CM | POA: Diagnosis present

## 2020-10-28 DIAGNOSIS — Z833 Family history of diabetes mellitus: Secondary | ICD-10-CM

## 2020-10-28 DIAGNOSIS — Z800 Family history of malignant neoplasm of digestive organs: Secondary | ICD-10-CM

## 2020-10-28 DIAGNOSIS — R000 Tachycardia, unspecified: Secondary | ICD-10-CM | POA: Diagnosis present

## 2020-10-28 DIAGNOSIS — N170 Acute kidney failure with tubular necrosis: Secondary | ICD-10-CM | POA: Diagnosis present

## 2020-10-28 DIAGNOSIS — G936 Cerebral edema: Secondary | ICD-10-CM | POA: Diagnosis present

## 2020-10-28 DIAGNOSIS — R7989 Other specified abnormal findings of blood chemistry: Secondary | ICD-10-CM | POA: Diagnosis present

## 2020-10-28 DIAGNOSIS — G935 Compression of brain: Secondary | ICD-10-CM | POA: Diagnosis present

## 2020-10-28 DIAGNOSIS — R680 Hypothermia, not associated with low environmental temperature: Secondary | ICD-10-CM | POA: Diagnosis present

## 2020-10-28 DIAGNOSIS — I6782 Cerebral ischemia: Secondary | ICD-10-CM

## 2020-10-28 DIAGNOSIS — F17210 Nicotine dependence, cigarettes, uncomplicated: Secondary | ICD-10-CM | POA: Diagnosis present

## 2020-10-28 DIAGNOSIS — J9600 Acute respiratory failure, unspecified whether with hypoxia or hypercapnia: Secondary | ICD-10-CM

## 2020-10-28 DIAGNOSIS — R579 Shock, unspecified: Secondary | ICD-10-CM | POA: Diagnosis not present

## 2020-10-28 DIAGNOSIS — E874 Mixed disorder of acid-base balance: Secondary | ICD-10-CM | POA: Diagnosis present

## 2020-10-28 DIAGNOSIS — I493 Ventricular premature depolarization: Secondary | ICD-10-CM | POA: Diagnosis not present

## 2020-10-28 DIAGNOSIS — F32A Depression, unspecified: Secondary | ICD-10-CM | POA: Diagnosis present

## 2020-10-28 DIAGNOSIS — J9601 Acute respiratory failure with hypoxia: Secondary | ICD-10-CM

## 2020-10-28 DIAGNOSIS — G9382 Brain death: Secondary | ICD-10-CM | POA: Diagnosis not present

## 2020-10-28 DIAGNOSIS — E872 Acidosis, unspecified: Secondary | ICD-10-CM

## 2020-10-28 DIAGNOSIS — E785 Hyperlipidemia, unspecified: Secondary | ICD-10-CM | POA: Diagnosis present

## 2020-10-28 DIAGNOSIS — J690 Pneumonitis due to inhalation of food and vomit: Secondary | ICD-10-CM | POA: Diagnosis present

## 2020-10-28 DIAGNOSIS — E870 Hyperosmolality and hypernatremia: Secondary | ICD-10-CM | POA: Diagnosis present

## 2020-10-28 DIAGNOSIS — R570 Cardiogenic shock: Secondary | ICD-10-CM | POA: Diagnosis present

## 2020-10-28 DIAGNOSIS — Z79899 Other long term (current) drug therapy: Secondary | ICD-10-CM

## 2020-10-28 DIAGNOSIS — J449 Chronic obstructive pulmonary disease, unspecified: Secondary | ICD-10-CM | POA: Diagnosis present

## 2020-10-28 DIAGNOSIS — Z515 Encounter for palliative care: Secondary | ICD-10-CM

## 2020-10-28 DIAGNOSIS — Z90710 Acquired absence of both cervix and uterus: Secondary | ICD-10-CM

## 2020-10-28 DIAGNOSIS — E878 Other disorders of electrolyte and fluid balance, not elsewhere classified: Secondary | ICD-10-CM

## 2020-10-28 LAB — BASIC METABOLIC PANEL
Anion gap: 11 (ref 5–15)
Anion gap: 9 (ref 5–15)
BUN: 14 mg/dL (ref 8–23)
BUN: 15 mg/dL (ref 8–23)
CO2: 24 mmol/L (ref 22–32)
CO2: 24 mmol/L (ref 22–32)
Calcium: 8 mg/dL — ABNORMAL LOW (ref 8.9–10.3)
Calcium: 8.1 mg/dL — ABNORMAL LOW (ref 8.9–10.3)
Chloride: 106 mmol/L (ref 98–111)
Chloride: 107 mmol/L (ref 98–111)
Creatinine, Ser: 1.29 mg/dL — ABNORMAL HIGH (ref 0.44–1.00)
Creatinine, Ser: 1.36 mg/dL — ABNORMAL HIGH (ref 0.44–1.00)
GFR, Estimated: 45 mL/min — ABNORMAL LOW (ref >60)
GFR, Estimated: 43 mL/min — ABNORMAL LOW (ref >60)
Glucose, Bld: 205 mg/dL — ABNORMAL HIGH (ref 70–99)
Glucose, Bld: 264 mg/dL — ABNORMAL HIGH (ref 70–99)
Potassium: 4.2 mmol/L (ref 3.5–5.1)
Potassium: 2.9 mmol/L — ABNORMAL LOW (ref 3.5–5.1)
Sodium: 141 mmol/L (ref 135–145)
Sodium: 140 mmol/L (ref 135–145)

## 2020-10-28 LAB — I-STAT ARTERIAL BLOOD GAS, ED
Acid-base deficit: 7 mmol/L — ABNORMAL HIGH (ref 0.0–2.0)
Bicarbonate: 22.7 mmol/L (ref 20.0–28.0)
Calcium, Ion: 1.15 mmol/L (ref 1.15–1.40)
HCT: 28 % — ABNORMAL LOW (ref 36.0–46.0)
Hemoglobin: 9.5 g/dL — ABNORMAL LOW (ref 12.0–15.0)
O2 Saturation: 99 %
Potassium: 5.5 mmol/L — ABNORMAL HIGH (ref 3.5–5.1)
Sodium: 138 mmol/L (ref 135–145)
TCO2: 25 mmol/L (ref 22–32)
pCO2 arterial: 69.8 mmHg (ref 32.0–48.0)
pH, Arterial: 7.12 — CL (ref 7.350–7.450)
pO2, Arterial: 195 mmHg — ABNORMAL HIGH (ref 83.0–108.0)

## 2020-10-28 LAB — CBC
HCT: 30.4 % — ABNORMAL LOW (ref 36.0–46.0)
HCT: 30.4 % — ABNORMAL LOW (ref 36.0–46.0)
Hemoglobin: 8.1 g/dL — ABNORMAL LOW (ref 12.0–15.0)
Hemoglobin: 8.6 g/dL — ABNORMAL LOW (ref 12.0–15.0)
MCH: 27.4 pg (ref 26.0–34.0)
MCH: 27.9 pg (ref 26.0–34.0)
MCHC: 26.6 g/dL — ABNORMAL LOW (ref 30.0–36.0)
MCHC: 28.3 g/dL — ABNORMAL LOW (ref 30.0–36.0)
MCV: 102.7 fL — ABNORMAL HIGH (ref 80.0–100.0)
MCV: 98.7 fL (ref 80.0–100.0)
Platelets: 228 10*3/uL (ref 150–400)
Platelets: 306 10*3/uL (ref 150–400)
RBC: 2.96 MIL/uL — ABNORMAL LOW (ref 3.87–5.11)
RBC: 3.08 MIL/uL — ABNORMAL LOW (ref 3.87–5.11)
RDW: 21.5 % — ABNORMAL HIGH (ref 11.5–15.5)
RDW: 21.2 % — ABNORMAL HIGH (ref 11.5–15.5)
WBC: 14.8 10*3/uL — ABNORMAL HIGH (ref 4.0–10.5)
WBC: 23.9 10*3/uL — ABNORMAL HIGH (ref 4.0–10.5)
nRBC: 3.2 % — ABNORMAL HIGH (ref 0.0–0.2)
nRBC: 2.5 % — ABNORMAL HIGH (ref 0.0–0.2)

## 2020-10-28 LAB — COMPREHENSIVE METABOLIC PANEL
ALT: 45 U/L — ABNORMAL HIGH (ref 0–44)
AST: 77 U/L — ABNORMAL HIGH (ref 15–41)
Albumin: 1.4 g/dL — ABNORMAL LOW (ref 3.5–5.0)
Alkaline Phosphatase: 75 U/L (ref 38–126)
Anion gap: 10 (ref 5–15)
BUN: 7 mg/dL — ABNORMAL LOW (ref 8–23)
CO2: 12 mmol/L — ABNORMAL LOW (ref 22–32)
Calcium: 4.8 mg/dL — CL (ref 8.9–10.3)
Chloride: 114 mmol/L — ABNORMAL HIGH (ref 98–111)
Creatinine, Ser: 0.62 mg/dL (ref 0.44–1.00)
GFR, Estimated: >60 mL/min (ref >60)
Glucose, Bld: 446 mg/dL — ABNORMAL HIGH (ref 70–99)
Potassium: 2.7 mmol/L — CL (ref 3.5–5.1)
Sodium: 136 mmol/L (ref 135–145)
Total Bilirubin: 0.7 mg/dL (ref 0.3–1.2)
Total Protein: 3.1 g/dL — ABNORMAL LOW (ref 6.5–8.1)

## 2020-10-28 LAB — TROPONIN I (HIGH SENSITIVITY)
Troponin I (High Sensitivity): 87 ng/L — ABNORMAL HIGH (ref <18)
Troponin I (High Sensitivity): 800 ng/L (ref <18)

## 2020-10-28 LAB — RAPID URINE DRUG SCREEN, HOSP PERFORMED
Amphetamines: NOT DETECTED
Barbiturates: NOT DETECTED
Benzodiazepines: NOT DETECTED
Cocaine: NOT DETECTED
Opiates: NOT DETECTED
Tetrahydrocannabinol: NOT DETECTED

## 2020-10-28 LAB — POCT I-STAT 7, (LYTES, BLD GAS, ICA,H+H)
Acid-Base Excess: 0 mmol/L (ref 0.0–2.0)
Acid-base deficit: 1 mmol/L (ref 0.0–2.0)
Bicarbonate: 26.3 mmol/L (ref 20.0–28.0)
Bicarbonate: 26.4 mmol/L (ref 20.0–28.0)
Calcium, Ion: 1.16 mmol/L (ref 1.15–1.40)
Calcium, Ion: 1.19 mmol/L (ref 1.15–1.40)
HCT: 31 % — ABNORMAL LOW (ref 36.0–46.0)
HCT: 29 % — ABNORMAL LOW (ref 36.0–46.0)
Hemoglobin: 10.5 g/dL — ABNORMAL LOW (ref 12.0–15.0)
Hemoglobin: 9.9 g/dL — ABNORMAL LOW (ref 12.0–15.0)
O2 Saturation: 85 %
O2 Saturation: 100 %
Patient temperature: 34.8
Patient temperature: 34
Potassium: 3.2 mmol/L — ABNORMAL LOW (ref 3.5–5.1)
Potassium: 3.1 mmol/L — ABNORMAL LOW (ref 3.5–5.1)
Sodium: 142 mmol/L (ref 135–145)
Sodium: 142 mmol/L (ref 135–145)
TCO2: 28 mmol/L (ref 22–32)
TCO2: 28 mmol/L (ref 22–32)
pCO2 arterial: 50.5 mmHg — ABNORMAL HIGH (ref 32.0–48.0)
pCO2 arterial: 44.8 mmHg (ref 32.0–48.0)
pH, Arterial: 7.314 — ABNORMAL LOW (ref 7.350–7.450)
pH, Arterial: 7.363 (ref 7.350–7.450)
pO2, Arterial: 49 mmHg — ABNORMAL LOW (ref 83.0–108.0)
pO2, Arterial: 272 mmHg — ABNORMAL HIGH (ref 83.0–108.0)

## 2020-10-28 LAB — HIV ANTIBODY (ROUTINE TESTING W REFLEX): HIV Screen 4th Generation wRfx: NONREACTIVE

## 2020-10-28 LAB — BRAIN NATRIURETIC PEPTIDE: B Natriuretic Peptide: 266.6 pg/mL — ABNORMAL HIGH (ref 0.0–100.0)

## 2020-10-28 LAB — PROTIME-INR
INR: 1.5 — ABNORMAL HIGH (ref 0.8–1.2)
Prothrombin Time: 17.2 seconds — ABNORMAL HIGH (ref 11.4–15.2)

## 2020-10-28 LAB — HEMOGLOBIN A1C
Hgb A1c MFr Bld: 8.9 % — ABNORMAL HIGH (ref 4.8–5.6)
Mean Plasma Glucose: 208.73 mg/dL

## 2020-10-28 LAB — MAGNESIUM
Magnesium: 2.1 mg/dL (ref 1.7–2.4)
Magnesium: 1.9 mg/dL (ref 1.7–2.4)
Magnesium: 1.7 mg/dL (ref 1.7–2.4)

## 2020-10-28 LAB — CORTISOL: Cortisol, Plasma: 31.5 ug/dL

## 2020-10-28 LAB — LACTIC ACID, PLASMA
Lactic Acid, Venous: 9.8 mmol/L (ref 0.5–1.9)
Lactic Acid, Venous: 2.8 mmol/L (ref 0.5–1.9)

## 2020-10-28 LAB — MRSA PCR SCREENING: MRSA by PCR: NEGATIVE

## 2020-10-28 LAB — RESP PANEL BY RT-PCR (FLU A&B, COVID) ARPGX2
Influenza A by PCR: NEGATIVE
Influenza B by PCR: NEGATIVE
SARS Coronavirus 2 by RT PCR: NEGATIVE

## 2020-10-28 LAB — GLUCOSE, CAPILLARY
Glucose-Capillary: 142 mg/dL — ABNORMAL HIGH (ref 70–99)
Glucose-Capillary: 192 mg/dL — ABNORMAL HIGH (ref 70–99)
Glucose-Capillary: 253 mg/dL — ABNORMAL HIGH (ref 70–99)

## 2020-10-28 LAB — PHOSPHORUS
Phosphorus: 2.8 mg/dL (ref 2.5–4.6)
Phosphorus: 3.3 mg/dL (ref 2.5–4.6)

## 2020-10-28 LAB — PROCALCITONIN: Procalcitonin: 0.47 ng/mL

## 2020-10-28 LAB — APTT: aPTT: 46 seconds — ABNORMAL HIGH (ref 24–36)

## 2020-10-28 MED ORDER — IPRATROPIUM-ALBUTEROL 0.5-2.5 (3) MG/3ML IN SOLN
3.0000 mL | Freq: Four times a day (QID) | RESPIRATORY_TRACT | Status: AC
  Administered 2020-10-28 – 2020-10-29 (×4): 3 mL via RESPIRATORY_TRACT
  Filled 2020-10-28 (×4): qty 3

## 2020-10-28 MED ORDER — PROSOURCE TF PO LIQD
45.0000 mL | Freq: Two times a day (BID) | ORAL | Status: DC
  Administered 2020-10-28 – 2020-10-29 (×3): 45 mL
  Filled 2020-10-28 (×3): qty 45

## 2020-10-28 MED ORDER — NOREPINEPHRINE 4 MG/250ML-% IV SOLN
INTRAVENOUS | Status: AC | PRN
  Administered 2020-10-28: 10 ug/min via INTRAVENOUS

## 2020-10-28 MED ORDER — ACETAMINOPHEN 500 MG PO TABS
500.0000 mg | ORAL_TABLET | Freq: Four times a day (QID) | ORAL | Status: AC | PRN
  Administered 2020-10-28: 500 mg via NASOGASTRIC
  Filled 2020-10-28: qty 1

## 2020-10-28 MED ORDER — SODIUM CHLORIDE 0.9 % IV BOLUS
1000.0000 mL | Freq: Once | INTRAVENOUS | Status: AC
  Administered 2020-10-28: 1000 mL via INTRAVENOUS

## 2020-10-28 MED ORDER — VANCOMYCIN HCL 1500 MG/300ML IV SOLN
1500.0000 mg | Freq: Once | INTRAVENOUS | Status: AC
  Administered 2020-10-28: 1500 mg via INTRAVENOUS
  Filled 2020-10-28: qty 300

## 2020-10-28 MED ORDER — VANCOMYCIN HCL 1000 MG/200ML IV SOLN: 1000.0000 mg | INTRAVENOUS | Status: DC

## 2020-10-28 MED ORDER — DOCUSATE SODIUM 50 MG/5ML PO LIQD
100.0000 mg | Freq: Two times a day (BID) | ORAL | Status: DC | PRN
Start: 2020-10-28 — End: 2020-10-31

## 2020-10-28 MED ORDER — NOREPINEPHRINE 4 MG/250ML-% IV SOLN
INTRAVENOUS | Status: AC
  Filled 2020-10-28: qty 250

## 2020-10-28 MED ORDER — METHYLPREDNISOLONE SODIUM SUCC 40 MG IJ SOLR
40.0000 mg | Freq: Two times a day (BID) | INTRAMUSCULAR | Status: DC
  Administered 2020-10-28 – 2020-10-29 (×3): 40 mg via INTRAVENOUS
  Filled 2020-10-28 (×3): qty 1

## 2020-10-28 MED ORDER — SODIUM CHLORIDE 0.9% FLUSH
10.0000 mL | INTRAVENOUS | Status: DC | PRN
Start: 2020-10-28 — End: 2020-10-31

## 2020-10-28 MED ORDER — POLYETHYLENE GLYCOL 3350 17 G PO PACK: 17.0000 g | PACK | Freq: Every day | ORAL | Status: DC | PRN

## 2020-10-28 MED ORDER — EPINEPHRINE 1 MG/10ML IJ SOSY
PREFILLED_SYRINGE | INTRAMUSCULAR | Status: AC | PRN
  Administered 2020-10-28: 1 via INTRAVENOUS
  Administered 2020-10-28: 1 mg via INTRAVENOUS

## 2020-10-28 MED ORDER — SODIUM CHLORIDE 0.9 % IV SOLN
1.5000 g | Freq: Three times a day (TID) | INTRAVENOUS | Status: DC
  Administered 2020-10-29 – 2020-10-31 (×7): 1.5 g via INTRAVENOUS
  Filled 2020-10-28 (×3): qty 4
  Filled 2020-10-28: qty 0.07
  Filled 2020-10-28 (×2): qty 4
  Filled 2020-10-28: qty 0.07
  Filled 2020-10-28 (×2): qty 4

## 2020-10-28 MED ORDER — INSULIN ASPART 100 UNIT/ML ~~LOC~~ SOLN
0.0000 [IU] | SUBCUTANEOUS | Status: DC
  Administered 2020-10-28: 3 [IU] via SUBCUTANEOUS
  Administered 2020-10-28: 8 [IU] via SUBCUTANEOUS
  Administered 2020-10-28 – 2020-10-29 (×2): 2 [IU] via SUBCUTANEOUS
  Administered 2020-10-29 (×2): 3 [IU] via SUBCUTANEOUS
  Administered 2020-10-29: 2 [IU] via SUBCUTANEOUS
  Administered 2020-10-29: 5 [IU] via SUBCUTANEOUS
  Administered 2020-10-30 (×3): 3 [IU] via SUBCUTANEOUS
  Administered 2020-10-30: 8 [IU] via SUBCUTANEOUS
  Administered 2020-10-30: 3 [IU] via SUBCUTANEOUS
  Administered 2020-10-30: 5 [IU] via SUBCUTANEOUS
  Administered 2020-10-31: 3 [IU] via SUBCUTANEOUS
  Administered 2020-10-31: 5 [IU] via SUBCUTANEOUS

## 2020-10-28 MED ORDER — SODIUM BICARBONATE 8.4 % IV SOLN
50.0000 meq | Freq: Once | INTRAVENOUS | Status: AC
  Administered 2020-10-28: 50 meq via INTRAVENOUS
  Filled 2020-10-28: qty 50

## 2020-10-28 MED ORDER — LACTATED RINGERS IV SOLN: INTRAVENOUS | Status: DC

## 2020-10-28 MED ORDER — NOREPINEPHRINE 4 MG/250ML-% IV SOLN
0.0000 ug/min | INTRAVENOUS | Status: DC
  Administered 2020-10-28: 20 ug/min via INTRAVENOUS

## 2020-10-28 MED ORDER — NOREPINEPHRINE 16 MG/250ML-% IV SOLN
0.0000 ug/min | INTRAVENOUS | Status: DC
  Administered 2020-10-28: 10 ug/min via INTRAVENOUS
  Administered 2020-10-28: 20 ug/min via INTRAVENOUS
  Administered 2020-10-29: 2 ug/min via INTRAVENOUS
  Administered 2020-10-30: 7 ug/min via INTRAVENOUS
  Filled 2020-10-28 (×2): qty 250

## 2020-10-28 MED ORDER — ARTIFICIAL TEARS OPHTHALMIC OINT
TOPICAL_OINTMENT | OPHTHALMIC | Status: DC | PRN
  Filled 2020-10-28: qty 3.5

## 2020-10-28 MED ORDER — HEPARIN SODIUM (PORCINE) 5000 UNIT/ML IJ SOLN
5000.0000 [IU] | Freq: Three times a day (TID) | INTRAMUSCULAR | Status: DC
  Administered 2020-10-28 – 2020-10-31 (×9): 5000 [IU] via SUBCUTANEOUS
  Filled 2020-10-28 (×8): qty 1

## 2020-10-28 MED ORDER — DEXTROSE IN LACTATED RINGERS 5 % IV SOLN: INTRAVENOUS | Status: DC

## 2020-10-28 MED ORDER — CHLORHEXIDINE GLUCONATE CLOTH 2 % EX PADS
6.0000 | MEDICATED_PAD | Freq: Every day | CUTANEOUS | Status: DC
  Administered 2020-10-28 – 2020-10-31 (×3): 6 via TOPICAL

## 2020-10-28 MED ORDER — CHLORHEXIDINE GLUCONATE CLOTH 2 % EX PADS
6.0000 | MEDICATED_PAD | Freq: Every day | CUTANEOUS | Status: DC
  Administered 2020-10-28: 6 via TOPICAL

## 2020-10-28 MED ORDER — VITAL HIGH PROTEIN PO LIQD
1000.0000 mL | ORAL | Status: DC
  Administered 2020-10-28 – 2020-10-29 (×2): 1000 mL

## 2020-10-28 MED ORDER — PANTOPRAZOLE SODIUM 40 MG IV SOLR
40.0000 mg | Freq: Every day | INTRAVENOUS | Status: DC
  Administered 2020-10-28 – 2020-10-30 (×3): 40 mg via INTRAVENOUS
  Filled 2020-10-28 (×3): qty 40

## 2020-10-28 MED ORDER — POTASSIUM CHLORIDE 10 MEQ/50ML IV SOLN
10.0000 meq | INTRAVENOUS | Status: DC
  Filled 2020-10-28 (×6): qty 50

## 2020-10-28 MED ORDER — POTASSIUM CHLORIDE 10 MEQ/50ML IV SOLN
10.0000 meq | INTRAVENOUS | Status: AC
  Administered 2020-10-28 (×2): 10 meq via INTRAVENOUS

## 2020-10-28 MED ORDER — CHLORHEXIDINE GLUCONATE 0.12% ORAL RINSE (MEDLINE KIT)
15.0000 mL | Freq: Two times a day (BID) | OROMUCOSAL | Status: DC
  Administered 2020-10-28 – 2020-10-30 (×6): 15 mL via OROMUCOSAL

## 2020-10-28 MED ORDER — EPINEPHRINE 1 MG/10ML IJ SOSY
PREFILLED_SYRINGE | INTRAMUSCULAR | Status: AC | PRN
  Administered 2020-10-28: 1 via INTRAVENOUS

## 2020-10-28 MED ORDER — LACTATED RINGERS IV BOLUS
1000.0000 mL | Freq: Once | INTRAVENOUS | Status: AC
  Administered 2020-10-28: 1000 mL via INTRAVENOUS

## 2020-10-28 MED ORDER — SODIUM BICARBONATE 8.4 % IV SOLN
INTRAVENOUS | Status: AC | PRN
  Administered 2020-10-28: 50 meq via INTRAVENOUS

## 2020-10-28 MED ORDER — ORAL CARE MOUTH RINSE
15.0000 mL | OROMUCOSAL | Status: DC
  Administered 2020-10-28 – 2020-10-31 (×29): 15 mL via OROMUCOSAL

## 2020-10-28 MED ORDER — MAGNESIUM SULFATE 2 GM/50ML IV SOLN
2.0000 g | Freq: Once | INTRAVENOUS | Status: AC
  Administered 2020-10-28: 2 g via INTRAVENOUS
  Filled 2020-10-28: qty 50

## 2020-10-28 MED ORDER — SODIUM CHLORIDE 0.9% FLUSH
10.0000 mL | Freq: Two times a day (BID) | INTRAVENOUS | Status: DC
  Administered 2020-10-28 – 2020-10-30 (×6): 10 mL

## 2020-10-28 MED ORDER — POTASSIUM CHLORIDE 20 MEQ PO PACK: 40.0000 meq | PACK | Freq: Two times a day (BID) | ORAL | Status: DC

## 2020-10-28 NOTE — Progress Notes (Signed)
NAME:  Meredith Church MRN:  [id number] DOB:  1956/01/28 LOS: 0 ADMISSION DATE:  10/17/2020 DATE OF SERVICE:  10/27/2020  CHIEF COMPLAINT:  Cardiac arrest; post-CPR resuscitation   HISTORY & PHYSICAL  brief    This 65 y.o. African American female presented to the [facility] Emergency Department via EMS with complaints of cardiac arrest.  She reportedly called 911 and was found alone at home to be puslsess and apenic.  CPR initiated in the field for PEA arrest.  Initially, ROSC was obtained after 7 min of CPR, only to lose pulse and pressure 3 min later.  She was transported to the ER with CPR in progress en route.  She arrived in PEA arrest.  Case was discussed with Dr. Christy Gentles, who reports that over 30 min of CPR was required to achieve ROSC.  At the time of clinical interview, the patient is intubated and unreponsive to any stimuli.  No RSI medications were administered for intubation.  She is on norepinephrine infusion for BP support through a peripheral IV. She is synchronous with the ventilator.  No Doll's eyes.  No corneal reflex.  No cough/gag.   Past Medical/Surgical/Social/Family History   has a past medical history of Anemia, Anxiety (Dx 2007), Arthritis, Blood transfusion without reported diagnosis, COPD (chronic obstructive pulmonary disease) ([place]), Depression, Eczema, Hyperlipidemia (Dx 2010), Hypertension (Dx 2010), and Numbness and tingling of lower extremity.   has a past surgical history that includes Tibia fracture surgery; Abdominal hysterectomy; Lipoma excision; Salivary gland surgery; Lumbar laminectomy/decompression microdiscectomy (N/A, 07/03/2016); sciatica; Fracture surgery; Total hip arthroplasty (Right, 02/24/2017); Colonoscopy; Upper gastrointestinal endoscopy; and Polypectomy.      Procedures:    X 2/13: endotracheal intubation in ER after CPR for over 30 min.   Significant Diagnostic Tests:  N/A   Micro Data:  08/15/16 - HcAb -  ractve xxxx  2/13  Flu and covid - negative   Antimicrobials:   none   Interim history/subjective:   2/13 -is on the ventilator and 100% FiO2 with pulse ox 100%.  Pupils are fixed and dilated.  They are nonreactive.  No corneal reflex no gag reflex.  No motor movement.  No blink reflex.  Not on sedation.  Does not trigger the ventilator when ventilator r rate is reduced.  She is on Levophed.   Objective   BP (!) 130/93   Pulse (!) 106   Temp (!) 95.72 F (35.4 C)   Resp (!) 26   Ht 5\' 4"  (1.626 m)   Wt 72.6 kg   SpO2 100%   BMI 27.46 kg/m     Filed Weights   11/02/2020 0243  Weight: 72.6 kg    Intake/Output Summary (Last 24 hours) at 10/19/2020 0815 Last data filed at 10/30/2020 0800 Gross per 24 hour  Intake 1160.35 ml  Output --  Net 1160.35 ml    Vent Mode: PRVC FiO2 (%):  [100 %] 100 % Set Rate:  [18 bmp-32 bmp] 26 bmp Vt Set:  [430 mL-440 mL] 430 mL PEEP:  [5 cmH20] 5 cmH20 Plateau Pressure:  [16 cmH20-18 cmH20] 18 cmH20   Critically ill looking female.  On the ventilator.  Pupils are fixed and dilated.  No corneal reflex no gag reflex no motor movement.  No deep tendon reflexes.  Does not withdraw to painful stimuli.  Does not trigger the ventilator when ventilator rate reduced.  Clear to auscultation.  Tachycardic abdomen is soft limb extremities are intact.  No  overt bleeding.  She is deeply comatose    ICD 10 prob list   Patient Active Problem List   Diagnosis Date Noted  . PEA (Pulseless electrical activity) ([place]) 10/29/2020  . Severe anoxic-ischemic encephalopathy ([place]) 11/07/2020  . Shock circulatory ([place]) 10/17/2020  . Acute respiratory failure with hypoxia ([place]) 11/08/2020  . Electrolyte imbalance 10/30/2020  . Acidosis 11/10/2020  . Poor prognosis 10/23/2020  . Terminal illness 11/05/2020  . Macrocytic anemia 05/13/2019  . Postinflammatory hyperpigmentation 04/13/2017  . Obese 02/25/2017  . S/P right THA, AA 02/24/2017  . COPD (chronic  obstructive pulmonary disease) ([place]) 01/22/2017  . Primary osteoarthritis of right hip 11/03/2016  . Lumbosacral spondylosis with radiculopathy 07/03/2016  . 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 Positive D dimer 07/13/2015  . Xerosis of skin 05/30/2015  . Chronic radicular pain of lower back 05/07/2015  . 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 Sprain, strain 11/16/2014  . Smoker 07/27/2014  . Anxiety state 01/11/2014  . Essential hypertension, benign 01/11/2014       Resolved Hospital Problem list   N/A   Assessment & Plan:  ASSESSMENT / PLAN:   A:  Acute hypoxemic respiratory failure following cardiac arrest PEA.  10/24/2020 -> does not meet ventilator wean criteria because of deep coma and circulatory shock. On 100% fio2  P:   Full ventilator support Ventilator associated pneumonia bundle Needs terminal wean because of poor prognosis - could not get hold of daughter Reduce fio2 for pulse ox goal > 92     A:   Comatose following cardiac arrest.  Deeply comatose  10/23/2020: CT head with evidence of anoxic injury.  Clinical exam with high pretest probably for brain death  P:   Avoid sedation Supportive care Try to get a family We will see if he can do apnea test (will need to reduce fio2 needs on ventilator first)     A:   Circulatory shock following cardiac arrest  -could be pulmonary embolism versus herniation  10/23/2020 = Levophed 80mcg  P:  Levophed for MAP goal greater than 65   A: PEA cardiac arrest upon admission.  Etiology not clear  P: Too critically ill to get a CT angiogram chest    A:   Covid negative and flu negative P:   Supportive care Monitor   A:  No evidence of acute kidney injury but at high risk Severe metabolic and respiratory acidosis because of cardiac arrest P:  Monitor closely   A:  Mild hyperkalemia at arrival P: Monitor closely    A:   No evidence of  GI bleed Transaminitis +  P:   N.p.o. PPI  A:  Has chronic macrocytic anemia.  No overt bleeding currently   P:  - PRBC for hgb </= 6.9gm%    - exceptions are   -  if ACS susepcted/confirmed then transfuse for hgb </= 8.0gm%,  or    -  active bleeding with hemodynamic instability, then transfuse regardless of hemoglobin value   At at all times try to transfuse 1 unit prbc as possible with exception of active hemorrhage    A At risk for thrombocytopenia  P Monitor DVT prophylaxis   A:   At risk for hypo and hyperglycemia P:   SSI    Best practice:  Diet: tube feed Pain/Anxiety/Delirium protocol (if indicated): no VAP protocol (if indicated): YES DVT prophylaxis: heparin GI prophylaxis: Protonix Glucose control: N/A Mobility/Activity: Bedrest  Code Status: Full Code  However, in light of the significant neurologic deficits demonstrated at this time in the absence of confounding from ongoing therapy, I do not believe that any significant or meaningful chance for recovery would be provided by additional attempts at cardiopulmonary resuscitation in the event of cardiac arrest.  It is my professional opinion that her CODE STATUS should be changed to DO NOT RESUSCITATE.  This change, however, requires independent assessment of 2 different providers.  So, for now, she remains a FULL CODE.  Family Communication: 10/19/2020: Call the only number listed on the chart [phone] which is the daughter Calyn Sivils.  Left message to call back 8:40 AM   Disposition:  ICU     ATTESTATION & SIGNATURE   The patient Meredith Church is critically ill with multiple organ systems failure and requires high complexity decision making for assessment and support, frequent evaluation and titration of therapies, application of advanced monitoring technologies and extensive interpretation of multiple databases.   Critical Care Time devoted to patient care services described in this note  is  45  Minutes. This time reflects time of care of this signee Dr Brand Males. This critical care time does not reflect procedure time, or teaching time or supervisory time of PA/NP/Med student/Med Resident etc but could involve care discussion time     Dr. Brand Males, M.D., [facility].C.P Pulmonary and Critical Care Medicine Staff Physician [facility] Pager: [phone], If no answer or between  15:00h - 7:00h: call [phone]  11/04/2020 8:16 AM     LABS  Results for HARRIETT, AZAR (MRN [id number]) as of 11/02/2020 08:17  Ref. Range 10/20/2020 03:06 11/06/2020 05:25  Troponin I (High Sensitivity) Latest Ref Range: <18 ng/L 87 (H) 800 (HH)    PULMONARY Recent Labs  Lab 11/04/2020 0418  PHART 7.120*  PCO2ART 69.8*  PO2ART 195*  HCO3 22.7  TCO2 25  O2SAT 99.0    CBC Recent Labs  Lab 10/26/20 1115 10/20/2020 0306 10/25/2020 0418 11/03/2020 0525  HGB 8.3* 8.1* 9.5* 8.6*  HCT 30.1* 30.4* 28.0* 30.4*  WBC 9.9 14.8*  --  23.9*  PLT 279 228  --  306    COAGULATION Recent Labs  Lab 11/08/2020 0306  INR 1.5*    CARDIAC  No results for input(s): TROPONINI in the last 168 hours. No results for input(s): PROBNP in the last 168 hours.   CHEMISTRY Recent Labs  Lab 10/25/20 1455 11/08/2020 0306 10/23/2020 0418 10/27/2020 0525  NA 139 136 138  --   K 3.2* 2.7* 5.5*  --   CL 104 114*  --   --   CO2 26 12*  --   --   GLUCOSE 92 446*  --   --   BUN 7* 7*  --   --   CREATININE 0.65 0.62  --   --   CALCIUM 8.8* 4.8*  --   --   MG  --   --   --  2.1   Estimated Creatinine Clearance: 66.7 mL/min (by C-G formula based on SCr of 0.62 mg/dL).   LIVER Recent Labs  Lab 10/25/20 1455 10/19/2020 0306  AST 14* 77*  ALT 7 45*  ALKPHOS 99 75  BILITOT 0.4 0.7  PROT 7.8 3.1*  ALBUMIN 3.7 1.4*  INR  --  1.5*     INFECTIOUS Recent Labs  Lab 10/27/2020 0306 10/30/2020 0525 10/30/2020 0540  LATICACIDVEN  9.8*  --  2.8*   PROCALCITON  --  0.47  --      ENDOCRINE CBG (last 3)  No results for input(s): GLUCAP in the last 72 hours.       IMAGING x48h  - image(s) personally visualized  -   highlighted in bold CT HEAD WO CONTRAST  Addendum Date: 10/30/2020   ADDENDUM REPORT: 10/20/2020 06:30 ADDENDUM: Critical Value/emergent results were called by telephone at the time of interpretation on 11/11/2020 at 0626 hours to Dr. Gillermina Phy, who verbally acknowledged these results. Electronically Signed   By: Genevie Ann M.D.   On: 11/06/2020 06:30   Result Date: 10/27/2020 CLINICAL DATA:  65 year old female with shock following PEA arrest at home. Anoxic encephalopathy. EXAM: CT HEAD WITHOUT CONTRAST TECHNIQUE: Contiguous axial images were obtained from the base of the skull through the vertex without intravenous contrast. COMPARISON:  Report of noncontrast head CT 10/21/2001 (no images available). FINDINGS: Brain: Severe diffuse brain edema. Severe involvement of the brainstem and cerebellum, with loss of most basilar cisterns. Effaced 3rd and 4th ventricles. Diminutive lateral ventricles. Reversal of the normal supratentorial gray-white matter differentiation. No midline shift. No acute intracranial hemorrhage identified. Vascular: Mild Calcified atherosclerosis at the skull base. Skull: Congenital incomplete ossification of the posterior C1 ring. Degenerative vacuum phenomena at the tip of the odontoid. No acute osseous abnormality identified. Sinuses/Orbits: Mild maxillary mucosal thickening. Well pneumatized paranasal sinuses, middle ears and mastoids. Other: Intubated on the scout view. Small volume retained secretions in the nasopharynx. Visualized orbits and scalp soft tissues are within normal limits. IMPRESSION: 1. Severe diffuse brain edema compatible with severe anoxic injury. No intracranial hemorrhage identified. 2. Effaced ventricles and basilar cisterns but no herniation at this time. Electronically Signed: By: Genevie Ann  M.D. On: 11/04/2020 06:17   DG Chest Port 1 View  Result Date: 11/02/2020 CLINICAL DATA:  Endotracheal tube and OG tube placement. Acute respiratory failure. EXAM: PORTABLE CHEST 1 VIEW. The right costophrenic angle is collimated off view. Portable x-ray abdomen one view COMPARISON:  Chest x-ray 06/20/2019 FINDINGS: Interval placement endotracheal tube with tip terminating approximately 5 cm above the carina. Interval placement of an enteric tube coursing below the hemidiaphragm with tip and side port overlying the expected region of the gastric lumen. Right subclavian central venous catheter with tip overlying the expected region of the superior vena cava. The heart size and mediastinal contours are unchanged. Diffuse airspace and interstitial opacities slightly more prominent within the upper and mid lung zones. No pulmonary edema. No pleural effusion. No pneumothorax. No acute osseous abnormality. IMPRESSION: 1. Pulmonary findings suggestive of multifocal pneumonia versus ARDS. 2. Lines and tubes in grossly appropriate position. Electronically Signed   By: Iven Finn M.D.   On: 10/22/2020 04:55   DG Abd Portable 1V  Result Date: 11/02/2020 CLINICAL DATA:  Endotracheal tube and OG tube placement. Acute respiratory failure. EXAM: PORTABLE CHEST 1 VIEW. The right costophrenic angle is collimated off view. Portable x-ray abdomen one view COMPARISON:  Chest x-ray 06/20/2019 FINDINGS: Interval placement endotracheal tube with tip terminating approximately 5 cm above the carina. Interval placement of an enteric tube coursing below the hemidiaphragm with tip and side port overlying the expected region of the gastric lumen. Right subclavian central venous catheter with tip overlying the expected region of the superior vena cava. The heart size and mediastinal contours are unchanged. Diffuse airspace and interstitial opacities slightly more prominent within the upper and mid lung zones. No pulmonary edema. No  pleural effusion. No pneumothorax. No acute osseous abnormality. IMPRESSION: 1. Pulmonary findings suggestive of multifocal pneumonia versus ARDS. 2. Lines and tubes in grossly appropriate position. Electronically Signed   By: Iven Finn M.D.   On: 10/17/2020 04:55

## 2020-10-28 NOTE — ED Triage Notes (Signed)
Patient from home, patient called EMS and was found by fire department on floor, apneic and pulseless.  CPR started, patient was in PEA, rate of 20.  CPR initiated at [place].  ROSC obtained at [place] and lost about 3 mins later.  Patient arrives in PEA.

## 2020-10-28 NOTE — Progress Notes (Signed)
RT and RN transported pt from ED to 0P49 without complication. Pt respiratory status remained stable throughout transport. RT will continue to monitor.

## 2020-10-28 NOTE — Progress Notes (Signed)
RT obtained post intubation ABG with the following results. MD notified of criticals. RT will continue to monitor.   Results for MYELLE, POTEAT (MRN [id number]) as of 10/17/2020 04:41  Ref. Range 11/11/2020 04:18  Sample type Unknown ARTERIAL  pH, Arterial Latest Ref Range: 7.350 - 7.450  7.120 (LL)  pCO2 arterial Latest Ref Range: 32.0 - 48.0 mmHg 69.8 (HH)  pO2, Arterial Latest Ref Range: 83.0 - 108.0 mmHg 195 (H)  TCO2 Latest Ref Range: 22 - 32 mmol/L 25  Acid-base deficit Latest Ref Range: 0.0 - 2.0 mmol/L 7.0 (H)  Bicarbonate Latest Ref Range: 20.0 - 28.0 mmol/L 22.7  O2 Saturation Latest Units: % 99.0  Collection site Unknown Radial

## 2020-10-28 NOTE — Progress Notes (Signed)
eLink Physician-Brief Progress Note Patient Name: Meredith Church DOB: 1956-02-03 MRN: [id number]   Date of Service  10/30/2020  HPI/Events of Note  New patient evaluation. 77F with history of COPD who is admitted after out-of-hospital cardiac arrest with prolonged downtime. Arrived intubated to ED. Has been unresponsive to noxious stimuli since arrival. Has not required sedation. Synchronous with ventilator which is now set to 430x26, 5, 100%. ABG remains hypercarbic. NCHCT showed diffuse cerebral edema c/w diffuse anoxic brain injury secondary to prolonged cardiac arrest.   eICU Interventions  # Neuro: - Diffuse anoxic brain injury c/b diffuse cerebral edema. Poor neurological prognosis. - No sedation at this time. - Neuro consult / family goals of care discussion.  # Cardiac: - Levophed to support MAP target of 65.  # Respiratory: - Full MV: settings now 430x26, 5, 100%. Wean FiO2 as tolerated. Repeat ABG at 0900. - Solumedrol 40mg  BID + Duonebs for possible COPD contribution.  DVT PPX: heparin [place] GI PPX: protonix  Remainder of plan as per note by Dr. Carson Myrtle.     Intervention Category Evaluation Type: New Patient Evaluation  Charlott Rakes 10/23/2020, 6:35 AM

## 2020-10-28 NOTE — Progress Notes (Signed)
eLink Physician-Brief Progress Note Patient Name: Meredith Church DOB: 07-11-1956 MRN: [id number]   Date of Service  11/01/2020  HPI/Events of Note  New ever 100.9 Was hypothermic was on warmer. Discussed with RN.  1F with history of COPD who is admitted after out-of-hospital cardiac arrest with prolonged downtime.  CTH: cerebral edema . As per Neuro-ABI.waiting for neuro test once normothermia.  Leukocytosis. CxR multifocal air space densities. Covid neg.  Not on antibiotics.   eICU Interventions  Follow cultures Start empirically for asp pneumonia.  Vanc/unasyn. Allergy-sulfs On 10 mcg of levo. Making urine/AKI. Tylenol      Intervention Category Intermediate Interventions: Other:  Elmer Sow 10/20/2020, 10:34 PM

## 2020-10-28 NOTE — H&P (Signed)
NAME:  Meredith Church MRN:  [id number] DOB:  17-Dec-1955 LOS: 0 ADMISSION DATE:  10/22/2020 DATE OF SERVICE:  10/30/2020  CHIEF COMPLAINT:  Cardiac arrest; post-CPR resuscitation   HISTORY & PHYSICAL  History of Present Illness  This 65 y.o. African American female presented to the [facility] Emergency Department via EMS with complaints of cardiac arrest.  She reportedly called 911 and was found alone at home to be puslsess and apenic.  CPR initiated in the field for PEA arrest.  Initially, ROSC was obtained after 7 min of CPR, only to lose pulse and pressure 3 min later.  She was transported to the ER with CPR in progress en route.  She arrived in PEA arrest.  Case was discussed with Dr. Christy Gentles, who reports that over 30 min of CPR was required to achieve ROSC.  At the time of clinical interview, the patient is intubated and unreponsive to any stimuli.  No RSI medications were administered for intubation.  She is on norepinephrine infusion for BP support through a peripheral IV. She is synchronous with the ventilator.  No Doll's eyes.  No corneal reflex.  No cough/gag.  REVIEW OF SYSTEMS This patient is critically ill and cannot provide additional history nor review of systems due to mental status/unconsciousness and endotracheally intubated.   Past Medical/Surgical/Social/Family History   Past Medical History:  Diagnosis Date  . Anemia   . Anxiety Dx 2007  . Arthritis   . Blood transfusion without reported diagnosis   . COPD (chronic obstructive pulmonary disease) ([place])   . Depression   . Eczema   . Hyperlipidemia Dx 2010  . Hypertension Dx 2010  . Numbness and tingling of lower extremity     Past Surgical History:  Procedure Laterality Date  . ABDOMINAL HYSTERECTOMY    . COLONOSCOPY    . FRACTURE SURGERY     Left pinky finger  . LIPOMA EXCISION    . LUMBAR LAMINECTOMY/DECOMPRESSION MICRODISCECTOMY N/A 07/03/2016   Procedure: LUMBAR FOUR - LUMBAR FIVE  LAMINECTOMY/DISKECTOMY;  Surgeon: [name] [name], MD;  Location: [facility];  Service: Neurosurgery;  Laterality: N/A;  LUMBAR FOUR - LUMBAR FIVE LAMINECTOMY/DISKECTOMY  . POLYPECTOMY    . SALIVARY GLAND SURGERY    . sciatica     Dr. Sharmaine Base oct. 2017  . TIBIA FRACTURE SURGERY     right  . TOTAL HIP ARTHROPLASTY Right 02/24/2017   Procedure: RIGHT TOTAL HIP ARTHROPLASTY ANTERIOR APPROACH;  Surgeon: Paralee Cancel, MD;  Location: [facility];  Service: Orthopedics;  Laterality: Right;  . UPPER GASTROINTESTINAL ENDOSCOPY      Social History   Tobacco Use  . Smoking status: Current Some Day Smoker    Packs/day: 0.20    Years: 30.00    Pack years: 6.00    Types: Cigarettes  . Smokeless tobacco: Never Used  . Tobacco comment: Wears Nicotine patch   Substance Use Topics  . Alcohol use: Yes    Alcohol/week: 0.0 standard drinks    Comment: social    Family History  Problem Relation Age of Onset  . Diabetes Mother   . Diabetes Sister   . Cancer Brother        colon cancer   . Colon cancer Brother        dx in 28's  . Esophageal cancer Neg Hx   . Rectal cancer Neg Hx   . Stomach cancer Neg Hx      Procedures:  2/13: endotracheal intubation in  ER after CPR for over 30 min.   Significant Diagnostic Tests:  N/A   Micro Data:   Results for orders placed or performed in visit on 09/22/20  Novel Coronavirus, NAA ([facility])     Status: None   Collection Time: 09/22/20 11:23 AM   Specimen: Nasopharyngeal(NP) swabs in vial transport medium   Nasopharynge  Screenin  Result Value Ref Range Status   SARS-CoV-2, NAA Not Detected Not Detected Final    Comment: This nucleic acid amplification test was developed and its performance characteristics determined by [employer]. Nucleic acid amplification tests include RT-PCR and TMA. This test has not been FDA cleared or approved. This test has been authorized by FDA under an Emergency Use Authorization (EUA). This test is only  authorized for the duration of time the declaration that circumstances exist justifying the authorization of the emergency use of in vitro diagnostic tests for detection of SARS-CoV-2 virus and/or diagnosis of COVID-19 infection under section 564(b)(1) of the Act, 21 U.S.C. 660YTK-1(S) (1), unless the authorization is terminated or revoked sooner. When diagnostic testing is negative, the possibility of a false negative result should be considered in the context of a patient's recent exposures and the presence of clinical signs and symptoms consistent with COVID-19. An individual without symptoms of COVID-19 and who is not shedding SARS-CoV-2 virus wo uld expect to have a negative (not detected) result in this assay.       Antimicrobials:  N/A    Interim history/subjective:  N/A   Objective   Ht 5\' 4"  (1.626 m)   Wt 72.6 kg   BMI 27.46 kg/m     Filed Weights   10/25/2020 0243  Weight: 72.6 kg   No intake or output data in the 24 hours ending 11/01/2020 0312  Vent Mode: PRVC FiO2 (%):  [100 %] 100 % Set Rate:  [18 bmp] 18 bmp Vt Set:  [440 mL] 440 mL PEEP:  [5 cmH20] 5 cmH20 Plateau Pressure:  [18 cmH20] 18 cmH20   Examination: GENERAL: Intubated. comatose. No acute distress. HEAD: normocephalic, atraumatic EYE: pupils are midline, fixed and dilated.  No Doll's eyes. THROAT/ORAL CAVITY: ETT in situ.  NECK: supple, no thyromegaly, no JVD, no lymphadenopathy. Trachea midline. CHEST/LUNG: symmetric in development and expansion. Coarse breath sounds.  Rales and rhonchi bilaterally. HEART: Regular S1 and S2 without murmur, rub or gallop. ABDOMEN: soft, nontender, nondistended. Normoactive bowel sounds. No rebound. No guarding. No hepatosplenomegaly. EXTREMITIES: Edema: none. No cyanosis. No clubbing. 2+ DP pulses LYMPHATIC: no cervical/axillary/inguinal lymph nodes appreciated MUSCULOSKELETAL: No point tenderness. No bulk atrophy. Joints: normal inspection.  SKIN:  No  rash or lesion. NEUROLOGIC: No Doll's eyes intact. No corneal reflex. Synchronous with ventilator. Babinski absent. No response to tactile or noxious stimuli.     Resolved Hospital Problem list   N/A   Assessment & Plan:   ASSESSMENT/PLAN:  ASSESSMENT (included in the Hospital Problem List)  Active Problems:   Macrocytic anemia   Cardiac arrest ([place])   Anoxic encephalopathy ([place])   Shock ([place])   By systems: PULMONARY  Endotracheally intubated after ROSC  COPD Titrate vent settings based on ABG results. No sedation for now to allow neurologic assessment. DuoNeb/Pulmicort while intubated.  CARDIOVASCULAR  Shock, NOS  Cardiac arrest/pulseless electrical activity, required 30+ min CPR Titrate norepinephrine as needed to maintain MAP 65+. May need to add vasopressin.   RENAL: No acute issues Monitor urine output.   GASTROINTESTINAL: No acute issues  GI PROPHYLAXIS: Protonix Place OG  tube.   HEMATOLOGIC  Macrocytic anemia  DVT PROPHYLAXIS: heparin   INFECTIOUS: No acute issues Follow up respiratory viral panel.   ENDOCRINE: No acute issues   NEUROLOGIC  Anoxic encephalopathy Head CT when able. Grim prognosis.  Her current exam raises concern for impending brain death.  Will continue to support maximally for now.   PLAN/RECOMMENDATIONS   Admit to ICU under my service (Attending: Renee Pain, MD) with the diagnoses highlighted above in the active Hospital Problem List (ASSESSMENT).  In light of the significant neurologic deficits demonstrated at this time in the absence of confounding from ongoing therapy, I do not believe that any significant or meaningful chance for recovery would be provided by additional attempts at cardiopulmonary resuscitation in the event of cardiac arrest.  It is my professional opinion that her CODE STATUS should be changed to DO NOT RESUSCITATE.  This change, however, requires independent assessment of 2 different providers.   So, for now, she remains a FULL CODE.    My assessment, plan of care, findings, medications, side effects, etc. were discussed with: nurse, respiratory therapist and Dr. Christy Gentles (Emergency Medicine).   Best practice:  Diet: tube feed Pain/Anxiety/Delirium protocol (if indicated): no VAP protocol (if indicated): YES DVT prophylaxis: heparin GI prophylaxis: Protonix Glucose control: N/A Mobility/Activity: Bedrest   Code Status: Full Code  However, in light of the significant neurologic deficits demonstrated at this time in the absence of confounding from ongoing therapy, I do not believe that any significant or meaningful chance for recovery would be provided by additional attempts at cardiopulmonary resuscitation in the event of cardiac arrest.  It is my professional opinion that her CODE STATUS should be changed to DO NOT RESUSCITATE.  This change, however, requires independent assessment of 2 different providers.  So, for now, she remains a FULL CODE. Family Communication:  no family at bedside Disposition: admit to ICU  Labs   CBC: Recent Labs  Lab 10/25/20 1455 10/26/20 1115  WBC 8.6 9.9  NEUTROABS 5.4 6.8  HGB 7.7* 8.3*  HCT 27.8* 30.1*  MCV 93.3 94.1  PLT 278 254    Basic Metabolic Panel: Recent Labs  Lab 10/25/20 1455  NA 139  K 3.2*  CL 104  CO2 26  GLUCOSE 92  BUN 7*  CREATININE 0.65  CALCIUM 8.8*   GFR: Estimated Creatinine Clearance: 66.7 mL/min (by C-G formula based on SCr of 0.65 mg/dL). Recent Labs  Lab 10/25/20 1455 10/26/20 1115  WBC 8.6 9.9    Liver Function Tests: Recent Labs  Lab 10/25/20 1455  AST 14*  ALT 7  ALKPHOS 99  BILITOT 0.4  PROT 7.8  ALBUMIN 3.7   No results for input(s): LIPASE, AMYLASE in the last 168 hours. No results for input(s): AMMONIA in the last 168 hours.  ABG No results found for: PHART, PCO2ART, PO2ART, HCO3, TCO2, ACIDBASEDEF, O2SAT   Coagulation Profile: No results for input(s): INR, PROTIME in the  last 168 hours.  Cardiac Enzymes: No results for input(s): CKTOTAL, CKMB, CKMBINDEX, TROPONINI in the last 168 hours.  HbA1C: Hemoglobin A1C  Date/Time Value Ref Range Status  10/03/2013 02:47 PM 5.9  Final    CBG: No results for input(s): GLUCAP in the last 168 hours.   Past Medical History   Past Medical History:  Diagnosis Date  . Anemia   . Anxiety Dx 2007  . Arthritis   . Blood transfusion without reported diagnosis   . COPD (chronic obstructive pulmonary disease) ([place])   .  Depression   . Eczema   . Hyperlipidemia Dx 2010  . Hypertension Dx 2010  . Numbness and tingling of lower extremity       Surgical History    Past Surgical History:  Procedure Laterality Date  . ABDOMINAL HYSTERECTOMY    . COLONOSCOPY    . FRACTURE SURGERY     Left pinky finger  . LIPOMA EXCISION    . LUMBAR LAMINECTOMY/DECOMPRESSION MICRODISCECTOMY N/A 07/03/2016   Procedure: LUMBAR FOUR - LUMBAR FIVE LAMINECTOMY/DISKECTOMY;  Surgeon: [name] [name], MD;  Location: [facility];  Service: Neurosurgery;  Laterality: N/A;  LUMBAR FOUR - LUMBAR FIVE LAMINECTOMY/DISKECTOMY  . POLYPECTOMY    . SALIVARY GLAND SURGERY    . sciatica     Dr. Sharmaine Base oct. 2017  . TIBIA FRACTURE SURGERY     right  . TOTAL HIP ARTHROPLASTY Right 02/24/2017   Procedure: RIGHT TOTAL HIP ARTHROPLASTY ANTERIOR APPROACH;  Surgeon: Paralee Cancel, MD;  Location: [facility];  Service: Orthopedics;  Laterality: Right;  . UPPER GASTROINTESTINAL ENDOSCOPY        Social History   Social History   Socioeconomic History  . Marital status: Single    Spouse name: Not on file  . Number of children: Not on file  . Years of education: Not on file  . Highest education level: Not on file  Occupational History  . Not on file  Tobacco Use  . Smoking status: Current Some Day Smoker    Packs/day: 0.20    Years: 30.00    Pack years: 6.00    Types: Cigarettes  . Smokeless tobacco: Never Used  . Tobacco comment: Wears Nicotine  patch   Vaping Use  . Vaping Use: Never used  Substance and Sexual Activity  . Alcohol use: Yes    Alcohol/week: 0.0 standard drinks    Comment: social  . Drug use: Not Currently  . Sexual activity: Yes    Partners: Male  Other Topics Concern  . Not on file  Social History Narrative  . Not on file   Social Determinants of Health   Financial Resource Strain: Not on file  Food Insecurity: Not on file  Transportation Needs: Not on file  Physical Activity: Not on file  Stress: Not on file  Social Connections: Not on file      Family History    Family History  Problem Relation Age of Onset  . Diabetes Mother   . Diabetes Sister   . Cancer Brother        colon cancer   . Colon cancer Brother        dx in 68's  . Esophageal cancer Neg Hx   . Rectal cancer Neg Hx   . Stomach cancer Neg Hx    family history includes Cancer in her brother; Colon cancer in her brother; Diabetes in her mother and sister. There is no history of Esophageal cancer, Rectal cancer, or Stomach cancer.    Allergies Allergies  Allergen Reactions  . Other Other (See Comments)     Nickel "breaks my skin out" Fire ants  . Sulfa Antibiotics Swelling      Current Medications  Current Facility-Administered Medications:  .  0.9 %  sodium chloride infusion, 500 mL, Intravenous, Once, Milus Banister, MD .  EPINEPHrine (ADRENALIN) 1 MG/10ML injection, , Intravenous, PRN, Ripley Fraise, MD, 1 Syringe at 10/20/2020 0251 .  norepinephrine (LEVOPHED) 4-5 MG/250ML-% infusion SOLN, , , ,  .  norepinephrine (LEVOPHED) 4mg  in 214mL  premix infusion, , Intravenous, Continuous PRN, Ripley Fraise, MD, Last Rate: 37.5 mL/hr at 10/22/2020 0250, 10 mcg/min at 11/09/2020 0250 .  sodium bicarbonate injection, , Intravenous, PRN, Ripley Fraise, MD, 50 mEq at 11/03/2020 0253 .  sodium chloride 0.9 % bolus 1,000 mL, 1,000 mL, Intravenous, Once, Ripley Fraise, MD  Current Outpatient Medications:  .  ALPRAZolam  (XANAX) 0.25 MG tablet, Take 1 tablet (0.25 mg total) by mouth 2 (two) times daily as needed for anxiety., Disp: 60 tablet, Rfl: 4 .  ammonium lactate (LAC-HYDRIN) 12 % cream, Apply topically as needed for dry skin., Disp: 385 g, Rfl: 0 .  Biotin 1000 MCG tablet, Take 1,000 mcg by mouth daily. , Disp: , Rfl:  .  DULoxetine (CYMBALTA) 60 MG capsule, Take 1 capsule (60 mg total) by mouth daily. 1 qam, Disp: 30 capsule, Rfl: 5 .  hydroquinone 4 % cream, Apply topically 2 (two) times daily. To dark areas of face. Use of SPF 30 or 50 sunscreen moisturizer, Disp: 28.35 g, Rfl: 0 .  metoprolol tartrate (LOPRESSOR) 50 MG tablet, Take 1 tablet (50 mg total) by mouth 2 (two) times daily., Disp: 60 tablet, Rfl: 11 .  Misc. Devices (CANE) MISC, 1 each by Does not apply route daily., Disp: 1 each, Rfl: 0 .  Misc. Devices ([place]) MISC, 1 each by Does not apply route daily., Disp: 1 each, Rfl: 0 .  nicotine (NICODERM CQ - DOSED IN MG/24 HOURS) 14 mg/24hr patch, Place 1 patch (14 mg total) onto the skin daily., Disp: 28 patch, Rfl: 0 .  nitroGLYCERIN (NITROSTAT) 0.4 MG SL tablet, See admin instructions. (Patient not taking: Reported on 07/19/2020), Disp: , Rfl:  .  OVER THE COUNTER MEDICATION, Take 3 capsules by mouth 2 (two) times daily. [employer] Energizing Iron (Red Blood Cell Support Blend), Disp: , Rfl:  .  oxyCODONE-acetaminophen (PERCOCET/ROXICET) 5-325 MG tablet, Take 1 tablet by mouth daily. None after 6 PM., Disp: , Rfl:  .  pantoprazole (PROTONIX) 40 MG tablet, Take 40 mg by mouth 2 (two) times daily., Disp: , Rfl:  .  triamcinolone ointment (KENALOG) 0.5 %, APPLY 1 APPLICATION TOPICALLY 2 TIMES DAILY. APPLY TO HANDS, Disp: 30 g, Rfl: 2 .  vitamin C (ASCORBIC ACID) 250 MG tablet, Take 1 tablet (250 mg total) by mouth daily., Disp: 30 tablet, Rfl: 3   Home Medications  Prior to Admission medications   Medication Sig Start Date End Date Taking? Authorizing Provider  ALPRAZolam  (XANAX) 0.25 MG tablet Take 1 tablet (0.25 mg total) by mouth 2 (two) times daily as needed for anxiety. 10/24/20 10/24/21  Plovsky, Berneta Sages, MD  ammonium lactate (LAC-HYDRIN) 12 % cream Apply topically as needed for dry skin. 06/10/17   Charlott Rakes, MD  Biotin 1000 MCG tablet Take 1,000 mcg by mouth daily.     [provider]  DULoxetine (CYMBALTA) 60 MG capsule Take 1 capsule (60 mg total) by mouth daily. 1 qam 10/24/20   Plovsky, Berneta Sages, MD  hydroquinone 4 % cream Apply topically 2 (two) times daily. To dark areas of face. Use of SPF 30 or 50 sunscreen moisturizer 04/13/17   Funches, Josalyn, MD  metoprolol tartrate (LOPRESSOR) 50 MG tablet Take 1 tablet (50 mg total) by mouth 2 (two) times daily. 06/10/17   Charlott Rakes, MD  Misc. Devices (CANE) MISC 1 each by Does not apply route daily. 11/02/15   Boykin Nearing, MD  Misc. Devices (HUGO ROLLING WALKER BASIC) MISC 1 each  by Does not apply route daily. 11/14/16   Funches, Adriana Mccallum, MD  nicotine (NICODERM CQ - DOSED IN MG/24 HOURS) 14 mg/24hr patch Place 1 patch (14 mg total) onto the skin daily. 05/15/19   Pokhrel, Corrie Mckusick, MD  nitroGLYCERIN (NITROSTAT) 0.4 MG SL tablet See admin instructions. Patient not taking: Reported on 07/19/2020 02/02/19   [provider]  OVER THE COUNTER MEDICATION Take 3 capsules by mouth 2 (two) times daily. [employer] [address] Energizing Iron (Red Blood Cell Support Blend)    [provider]  oxyCODONE-acetaminophen (PERCOCET/ROXICET) 5-325 MG tablet Take 1 tablet by mouth daily. None after 6 PM. 04/11/19   [provider]  pantoprazole (PROTONIX) 40 MG tablet Take 40 mg by mouth 2 (two) times daily. 03/25/19   [provider]  triamcinolone ointment (KENALOG) 0.5 % APPLY 1 APPLICATION TOPICALLY 2 TIMES DAILY. APPLY TO HANDS 04/17/17   Funches, Adriana Mccallum, MD  vitamin C (ASCORBIC ACID) 250 MG tablet Take 1 tablet (250 mg total) by mouth daily. 05/15/19   Pokhrel, Corrie Mckusick, MD  pravastatin (PRAVACHOL)  40 MG tablet Take 1 tablet (40 mg total) by mouth daily. 10/03/13 01/30/14  Tresa Garter, MD      Critical care time: 60 minutes.  The treatment and management of the patient's condition was required based on the threat of imminent deterioration. This time reflects time spent by the physician evaluating, providing care and managing the critically ill patient's care. The time was spent at the immediate bedside (or on the same floor/unit and dedicated to this patient's care). Time involved in separately billable procedures is NOT included int he critical care time indicated above. Family meeting and update time may be included above if and only if the patient is unable/incompetent to participate in clinical interview and/or decision making, and the discussion was necessary to determining treatment decisions.   Renee Pain, MD Board Certified by the [employer], [place]

## 2020-10-28 NOTE — Consult Note (Addendum)
NEURO HOSPITALIST CONSULT NOTE   Requestig physician: Dr. Chase Caller  Reason for Consult:Comatose after home cardiac arrest  History obtained from: Chart and Critical Care MD    HPI:                                                                                                                                          KIMONI PICKERILL is a 65 y.o. female with a medical history significant for anemia, anxiety, COPD, hyperlipidemia, and hypertension who presented to the [facility] on 2/13 via EMS for cardiac arrest at home. Patient reportedly called 911 from home and was found by EMS pulseless and apneic and CPR was initiated in the field with ROSC obtained in 7 minutes. She then became pulseless 3 minutes later and CPR was resumed en route to the hospital. She was noted to be in PEA arrest on hospital arrival with an additional 30 minutes of CPR performed before obtaining ROSC. She was intubated without RSI medications and has been unresponsive to stimuli since admission last night. She is requiring continuous pressor infusions for blood pressure support and is on full ventilator support without spontaneous respirations.   Apnea testing was to be completed today but patient did not qualify for testing due to her PF ratio. Neurology was consulted for further assessment of patient's neurologic function in the event that apnea testing remains unavailable for clinical decision making.   Past Medical History:  Diagnosis Date  . Anemia   . Anxiety Dx 2007  . Arthritis   . Blood transfusion without reported diagnosis   . COPD (chronic obstructive pulmonary disease) ([place])   . Depression   . Eczema   . Hyperlipidemia Dx 2010  . Hypertension Dx 2010  . Numbness and tingling of lower extremity    Past Surgical History:  Procedure Laterality Date  . ABDOMINAL HYSTERECTOMY    . COLONOSCOPY    . FRACTURE SURGERY     Left pinky finger  . LIPOMA EXCISION    . LUMBAR  LAMINECTOMY/DECOMPRESSION MICRODISCECTOMY N/A 07/03/2016   Procedure: LUMBAR FOUR - LUMBAR FIVE LAMINECTOMY/DISKECTOMY;  Surgeon: [name] [name], MD;  Location: [place];  Service: Neurosurgery;  Laterality: N/A;  LUMBAR FOUR - LUMBAR FIVE LAMINECTOMY/DISKECTOMY  . POLYPECTOMY    . SALIVARY GLAND SURGERY    . sciatica     Dr. Sharmaine Base oct. 2017  . TIBIA FRACTURE SURGERY     right  . TOTAL HIP ARTHROPLASTY Right 02/24/2017   Procedure: RIGHT TOTAL HIP ARTHROPLASTY ANTERIOR APPROACH;  Surgeon: Paralee Cancel, MD;  Location: [facility];  Service: Orthopedics;  Laterality: Right;  . UPPER GASTROINTESTINAL ENDOSCOPY     Family History  Problem Relation Age of Onset  . Diabetes Mother   . Diabetes Sister   .  Cancer Brother        colon cancer   . Colon cancer Brother        dx in 26's  . Esophageal cancer Neg Hx   . Rectal cancer Neg Hx   . Stomach cancer Neg Hx    Social History:  reports that she has been smoking cigarettes. She has a 6.00 pack-year smoking history. She has never used smokeless tobacco. She reports current alcohol use. She reports previous drug use.  Allergies  Allergen Reactions  . Other Other (See Comments)     Nickel "breaks my skin out" Fire ants  . Sulfa Antibiotics Swelling   MEDICATIONS:                                                                                                                     Current Outpatient Medications  Medication Instructions  . ALPRAZolam (XANAX) 0.25 mg, Oral, 2 times daily PRN  . ammonium lactate (LAC-HYDRIN) 12 % cream Topical, As needed  . Biotin 1,000 mcg, Oral, Daily  . DULoxetine (CYMBALTA) 60 mg, Oral, Daily, 1 qam  . hydroquinone 4 % cream Topical, 2 times daily, To dark areas of face. Use of SPF 30 or 50 sunscreen moisturizer  . metoprolol tartrate (LOPRESSOR) 50 mg, Oral, 2 times daily  . Misc. Devices (CANE) MISC 1 each, Does not apply, Daily  . Misc. Devices (HUGO ROLLING WALKER BASIC) MISC 1 each, Does not apply,  Daily  . nicotine (NICODERM CQ - DOSED IN MG/24 HOURS) 14 mg, Transdermal, Daily  . nitroGLYCERIN (NITROSTAT) 0.4 MG SL tablet See admin instructions  . OVER THE COUNTER MEDICATION 3 capsules, Oral, 2 times daily, Nature's Way Energizing Iron (Red Blood Cell Support Blend)   . oxyCODONE-acetaminophen (PERCOCET/ROXICET) 5-325 MG tablet 1 tablet, Oral, Daily, None after 6 PM.  . pantoprazole (PROTONIX) 40 mg, Oral, 2 times daily  . triamcinolone ointment (KENALOG) 0.5 % APPLY 1 APPLICATION TOPICALLY 2 TIMES DAILY. APPLY TO HANDS  . vitamin C (ASCORBIC ACID) 250 mg, Oral, Daily   ROS:                                                                                                                                       History obtained from chart review and unobtainable from patient due to mental status Unable to assess a complete review of systems due to patient condition: intubated and comatose.  Blood pressure 116/78, pulse 98, temperature (!) 94.64 F (34.8 C), resp. rate 20, height 5\' 4"  (1.626 m), weight 72.6 kg, SpO2 93 %.   General Examination:                                                                                                      Physical Exam  HEAD: Normocephalic and atraumatic, no lesions, without obvious abnormality EENT:  Minimal corneal edema noted to the outer right eye. Normal conjunctiva.  Cardiovascular: Regular rate on cardiac monitor, extremities without edema, hypotensive on cardiac monitor   Lungs: ETT with full ventilator support, no secretions with in-line suctioning Abdomen:  Soft, non-tender Extremities: Warm, dry and intact, no edema noted Musculoskeletal: no joint deformity or swelling noted Skin: Warm and dry, no hyperpigmentation, vitiligo, or suspicious lesions  Neurological Examination Mental Status: Comatose without response to verbal, tactile, or noxious stimulation. Does not open eyes. No extremity movement noted with painful stimuli. Patient  intubated without sedation on examination.  Cranial Nerves: II: Blink to threat reflex absent throughout all visual fields. Pupils are dilated at 6 mm, irregular and non-reactive to light bilaterally.  III,IV, VI: Gaze midline and fixed; oculocephalic reflex absent. V,VII: Corneal reflex absent.  VIII: Caloric testing without eye movement or nystagmus bilaterally. IX,X: Absent cough and gag reflexes. XI: Head appears midline XII: Unable to assess due to patient condition Motor/Sensory: Flaccid throughout without any movement to noxious stimuli Right : Upper extremity   0/5    Left:     Upper extremity   0/5  Lower extremity   0/5     Lower extremity   0/5 Deep Tendon Reflexes: absent throughout Plantars: No movement Cerebellar/Gait: Unable to assess due to patient's condition   FOUR Score: 0 Eyelids remain closed with pain (0) No response to pain (0) Absent pupil, corneal, and cough reflexes (0) Breathes at ventilator rate (0)  GCS: 3 No eye opening (1) No verbal response (1) No motor response (1)  Lab Results: Basic Metabolic Panel: Recent Labs  Lab 10/25/20 1455 11/11/2020 0306 10/26/2020 0418 11/01/2020 0525 11/05/2020 0723 11/04/2020 0908  NA 139 136 138  --  141 142  K 3.2* 2.7* 5.5*  --  4.2 3.2*  CL 104 114*  --   --  106  --   CO2 26 12*  --   --  24  --   GLUCOSE 92 446*  --   --  205*  --   BUN 7* 7*  --   --  14  --   CREATININE 0.65 0.62  --   --  1.29*  --   CALCIUM 8.8* 4.8*  --   --  8.0*  --   MG  --   --   --  2.1  --   --    CBC: Recent Labs  Lab 10/25/20 1455 10/26/20 1115 10/23/2020 0306 11/09/2020 0418 10/26/2020 0525 11/06/2020 0908  WBC 8.6 9.9 14.8*  --  23.9*  --   NEUTROABS 5.4 6.8  --   --   --   --  HGB 7.7* 8.3* 8.1* 9.5* 8.6* 10.5*  HCT 27.8* 30.1* 30.4* 28.0* 30.4* 31.0*  MCV 93.3 94.1 102.7*  --  98.7  --   PLT 278 279 228  --  306  --    Imaging: CT HEAD WO CONTRAST  Addendum Date: 11/08/2020   ADDENDUM REPORT: 11/04/2020 06:30  ADDENDUM: Critical Value/emergent results were called by telephone at the time of interpretation on 11/03/2020 at 0626 hours to Dr. Gillermina Phy, who verbally acknowledged these results. Electronically Signed   By: Genevie Ann M.D.   On: 10/20/2020 06:30   Result Date: 10/22/2020 CLINICAL DATA:  65 year old female with shock following PEA arrest at home. Anoxic encephalopathy. EXAM: CT HEAD WITHOUT CONTRAST TECHNIQUE: Contiguous axial images were obtained from the base of the skull through the vertex without intravenous contrast. COMPARISON:  Report of noncontrast head CT 10/21/2001 (no images available). FINDINGS: Brain: Severe diffuse brain edema. Severe involvement of the brainstem and cerebellum, with loss of most basilar cisterns. Effaced 3rd and 4th ventricles. Diminutive lateral ventricles. Reversal of the normal supratentorial gray-white matter differentiation. No midline shift. No acute intracranial hemorrhage identified. Vascular: Mild Calcified atherosclerosis at the skull base. Skull: Congenital incomplete ossification of the posterior C1 ring. Degenerative vacuum phenomena at the tip of the odontoid. No acute osseous abnormality identified. Sinuses/Orbits: Mild maxillary mucosal thickening. Well pneumatized paranasal sinuses, middle ears and mastoids. Other: Intubated on the scout view. Small volume retained secretions in the nasopharynx. Visualized orbits and scalp soft tissues are within normal limits. IMPRESSION: 1. Severe diffuse brain edema compatible with severe anoxic injury. No intracranial hemorrhage identified. 2. Effaced ventricles and basilar cisterns but no herniation at this time. Electronically Signed: By: Genevie Ann M.D. On: 11/07/2020 06:17   DG Chest Port 1 View  Result Date: 10/30/2020 CLINICAL DATA:  Endotracheal tube and OG tube placement. Acute respiratory failure. EXAM: PORTABLE CHEST 1 VIEW. The right costophrenic angle is collimated off view. Portable x-ray abdomen one view COMPARISON:   Chest x-ray 06/20/2019 FINDINGS: Interval placement endotracheal tube with tip terminating approximately 5 cm above the carina. Interval placement of an enteric tube coursing below the hemidiaphragm with tip and side port overlying the expected region of the gastric lumen. Right subclavian central venous catheter with tip overlying the expected region of the superior vena cava. The heart size and mediastinal contours are unchanged. Diffuse airspace and interstitial opacities slightly more prominent within the upper and mid lung zones. No pulmonary edema. No pleural effusion. No pneumothorax. No acute osseous abnormality. IMPRESSION: 1. Pulmonary findings suggestive of multifocal pneumonia versus ARDS. 2. Lines and tubes in grossly appropriate position. Electronically Signed   By: Iven Finn M.D.   On: 11/07/2020 04:55   DG Abd Portable 1V  Result Date: 10/17/2020 CLINICAL DATA:  Endotracheal tube and OG tube placement. Acute respiratory failure. EXAM: PORTABLE CHEST 1 VIEW. The right costophrenic angle is collimated off view. Portable x-ray abdomen one view COMPARISON:  Chest x-ray 06/20/2019 FINDINGS: Interval placement endotracheal tube with tip terminating approximately 5 cm above the carina. Interval placement of an enteric tube coursing below the hemidiaphragm with tip and side port overlying the expected region of the gastric lumen. Right subclavian central venous catheter with tip overlying the expected region of the superior vena cava. The heart size and mediastinal contours are unchanged. Diffuse airspace and interstitial opacities slightly more prominent within the upper and mid lung zones. No pulmonary edema. No pleural effusion. No pneumothorax. No acute osseous abnormality. IMPRESSION: 1. Pulmonary  findings suggestive of multifocal pneumonia versus ARDS. 2. Lines and tubes in grossly appropriate position. Electronically Signed   By: Iven Finn M.D.   On: 10/27/2020 04:55    Assessment:  65 year old female s/p cardiac arrest with CT and exam findings that are most consistent with severe diffuse anoxic brain injury. 1. Exam reveals absence of brainstem or cortical responses; absent cough, gag, corneal reflexes, pupils fixed and dilated, no spontaneous respirations over set ventilator rate without sedating medications. Cold caloric testing is also consistent with severe brainstem damage.   2. CT head reveals severe diffuse brain edema compatible with severe anoxic injury. Effaced ventricles and basilar cisterns but no herniation at this time (6:17 AM). 3. Patient is hypotensive and hypothermic on assessment in the ICU with core temperature of 34 degrees Celsius. Literature suggests that to accurately diagnose brain death in adults with a clinical presentation most consistent with brain death, the patient must have a core temperature of at least 36 degrees Celsius and systolic blood pressure of 100 or greater.   Recommendations: 1. Given exam findings that are suggestive of brain death, resuscitating the patient in the event of cardiac or pulmonary arrest is felt most likely to be futile. However, note that the literature suggests that to accurately diagnose brain death in adults with a clinical presentation most consistent with brain death, the patient must have a core temperature of at least 36 degrees Celsius and systolic blood pressure of 100 or greater. 2. Suggest warming patient to at least 36 degrees Celsius with continued pressor infusion for goal systolic blood pressure maintained at > 100 mmHg  3. Agree with re-assessing apnea testing qualification this afternoon for further diagnostic evidence 4. During ongoing efforts to perform apnea test and contact family should the patient suffer a cardiac arrest, CPR or ACLS chemical code would be highly likely be medically ineffective.    Assisting with this consult: Anibal Henderson, AGACNP-BC [facility] Pager: [phone]  40 minutes spent in the neurological evaluation and management of this critically ill patient.   Electronically signed: Dr. Kerney Elbe 10/18/2020, 9:25 AM

## 2020-10-28 NOTE — ED Provider Notes (Signed)
[facility] EMERGENCY DEPARTMENT Provider Note   CSN: [id number] Arrival date & time:        History Chief Complaint  Patient presents with  . Cardiac Arrest   Level 5 caveat due to unresponsive Meredith Church is a 65 y.o. female.  HPI    Patient with history of anemia, COPD, hypertension, hyperlipidemia presents after cardiac arrest.  Per first responders, patient called 911 herself for help.  On arrival patient was found unresponsive and pulseless.  CPR was started immediately.  Patient did have return of spontaneous circulation for approximately 3 minutes.  Initial rhythm was PEA.  She received multiple rounds of epinephrine.  There was no defibrillation. On arrival to the ER patient is in cardiac arrest with CPR in progress.  No other details are known on arrival.  Patient has Edison Pace airway in place and intraosseous line to her left leg on arrival  Past Medical History:  Diagnosis Date  . Anemia   . Anxiety Dx 2007  . Arthritis   . Blood transfusion without reported diagnosis   . COPD (chronic obstructive pulmonary disease) ([place])   . Depression   . Eczema   . Hyperlipidemia Dx 2010  . Hypertension Dx 2010  . Numbness and tingling of lower extremity     Patient Active Problem List   Diagnosis Date Noted  . Symptomatic anemia 05/13/2019  . Postinflammatory hyperpigmentation 04/13/2017  . Obese 02/25/2017  . S/P right THA, AA 02/24/2017  . COPD (chronic obstructive pulmonary disease) ([place]) 01/22/2017  . Primary osteoarthritis of right hip 11/03/2016  . Lumbosacral spondylosis with radiculopathy 07/03/2016  . 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 Positive D dimer 07/13/2015  . Xerosis of skin 05/30/2015  . Chronic radicular pain of lower back 05/07/2015  . 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 Sprain, strain 11/16/2014  . Smoker 07/27/2014  . Anxiety state 01/11/2014  . Essential hypertension,  benign 01/11/2014    Past Surgical History:  Procedure Laterality Date  . ABDOMINAL HYSTERECTOMY    . COLONOSCOPY    . FRACTURE SURGERY     Left pinky finger  . LIPOMA EXCISION    . LUMBAR LAMINECTOMY/DECOMPRESSION MICRODISCECTOMY N/A 07/03/2016   Procedure: LUMBAR FOUR - LUMBAR FIVE LAMINECTOMY/DISKECTOMY;  Surgeon: [name] [name], MD;  Location: [facility];  Service: Neurosurgery;  Laterality: N/A;  LUMBAR FOUR - LUMBAR FIVE LAMINECTOMY/DISKECTOMY  . POLYPECTOMY    . SALIVARY GLAND SURGERY    . sciatica     Dr. Sharmaine Base oct. 2017  . TIBIA FRACTURE SURGERY     right  . TOTAL HIP ARTHROPLASTY Right 02/24/2017   Procedure: RIGHT TOTAL HIP ARTHROPLASTY ANTERIOR APPROACH;  Surgeon: Paralee Cancel, MD;  Location: [facility];  Service: Orthopedics;  Laterality: Right;  . UPPER GASTROINTESTINAL ENDOSCOPY       OB History    Gravida  2   Para  2   Term  2   Preterm      AB      Living  2     SAB      IAB      Ectopic      Multiple      Live Births  2           Family History  Problem Relation Age of Onset  . Diabetes Mother   . Diabetes Sister   . Cancer Brother        colon  cancer   . Colon cancer Brother        dx in 43's  . Esophageal cancer Neg Hx   . Rectal cancer Neg Hx   . Stomach cancer Neg Hx     Social History   Tobacco Use  . Smoking status: Current Some Day Smoker    Packs/day: 0.20    Years: 30.00    Pack years: 6.00    Types: Cigarettes  . Smokeless tobacco: Never Used  . Tobacco comment: Wears Nicotine patch   Vaping Use  . Vaping Use: Never used  Substance Use Topics  . Alcohol use: Yes    Alcohol/week: 0.0 standard drinks    Comment: social  . Drug use: Not Currently    Home Medications Prior to Admission medications   Medication Sig Start Date End Date Taking? Authorizing Provider  ALPRAZolam (XANAX) 0.25 MG tablet Take 1 tablet (0.25 mg total) by mouth 2 (two) times daily as needed for anxiety. 10/24/20 10/24/21  Plovsky, Berneta Sages,  MD  ammonium lactate (LAC-HYDRIN) 12 % cream Apply topically as needed for dry skin. 06/10/17   Charlott Rakes, MD  Biotin 1000 MCG tablet Take 1,000 mcg by mouth daily.     [provider]  DULoxetine (CYMBALTA) 60 MG capsule Take 1 capsule (60 mg total) by mouth daily. 1 qam 10/24/20   Plovsky, Berneta Sages, MD  hydroquinone 4 % cream Apply topically 2 (two) times daily. To dark areas of face. Use of SPF 30 or 50 sunscreen moisturizer 04/13/17   Funches, Josalyn, MD  metoprolol tartrate (LOPRESSOR) 50 MG tablet Take 1 tablet (50 mg total) by mouth 2 (two) times daily. 06/10/17   Charlott Rakes, MD  Misc. Devices (CANE) MISC 1 each by Does not apply route daily. 11/02/15   Boykin Nearing, MD  Misc. Devices (HUGO ROLLING WALKER BASIC) MISC 1 each by Does not apply route daily. 11/14/16   Funches, Adriana Mccallum, MD  nicotine (NICODERM CQ - DOSED IN MG/24 HOURS) 14 mg/24hr patch Place 1 patch (14 mg total) onto the skin daily. 05/15/19   Pokhrel, Corrie Mckusick, MD  nitroGLYCERIN (NITROSTAT) 0.4 MG SL tablet See admin instructions. Patient not taking: Reported on 07/19/2020 02/02/19   [provider]  OVER THE COUNTER MEDICATION Take 3 capsules by mouth 2 (two) times daily. [facility] [address] Energizing Iron (Red Blood Cell Support Blend)    [provider]  oxyCODONE-acetaminophen (PERCOCET/ROXICET) 5-325 MG tablet Take 1 tablet by mouth daily. None after 6 PM. 04/11/19   [provider]  pantoprazole (PROTONIX) 40 MG tablet Take 40 mg by mouth 2 (two) times daily. 03/25/19   [provider]  triamcinolone ointment (KENALOG) 0.5 % APPLY 1 APPLICATION TOPICALLY 2 TIMES DAILY. APPLY TO HANDS 04/17/17   Funches, Adriana Mccallum, MD  vitamin C (ASCORBIC ACID) 250 MG tablet Take 1 tablet (250 mg total) by mouth daily. 05/15/19   Pokhrel, Corrie Mckusick, MD  pravastatin (PRAVACHOL) 40 MG tablet Take 1 tablet (40 mg total) by mouth daily. 10/03/13 01/30/14  Tresa Garter, MD    Allergies    Other and Sulfa  antibiotics  Review of Systems   Review of Systems  Unable to perform ROS: Patient unresponsive    Physical Exam Updated Vital Signs Ht 1.626 m (5\' 4" )   Wt 72.6 kg   BMI 27.46 kg/m   Physical Exam CONSTITUTIONAL: Unresponsive HEAD: Normocephalic/atraumatic, no signs of trauma EYES: EOMI/PERRL, pupils fixed and dilated ENMT: King airway in place on arrival NECK: supple no  meningeal signs SPINE/BACK: No obvious bruising or signs of trauma CV: No spontaneous cardiac activity LUNGS: Equal breath sounds with bagging ABDOMEN: soft, nondistended GU: Normal appearance NEURO: Pt is unresponsive EXTREMITIES: No deformities.  IO in left leg SKIN: Cool to touch PSYCH: Unable to assess  ED Results / Procedures / Treatments   Labs (all labs ordered are listed, but only abnormal results are displayed) Labs Reviewed  CBC - Abnormal; Notable for the following components:      Result Value   WBC 14.8 (*)    RBC 2.96 (*)    Hemoglobin 8.1 (*)    HCT 30.4 (*)    MCV 102.7 (*)    MCHC 26.6 (*)    RDW 21.5 (*)    nRBC 3.2 (*)    All other components within normal limits  APTT - Abnormal; Notable for the following components:   aPTT 46 (*)    All other components within normal limits  PROTIME-INR - Abnormal; Notable for the following components:   Prothrombin Time 17.2 (*)    INR 1.5 (*)    All other components within normal limits  COMPREHENSIVE METABOLIC PANEL - Abnormal; Notable for the following components:   Potassium 2.7 (*)    Chloride 114 (*)    CO2 12 (*)    Glucose, Bld 446 (*)    BUN 7 (*)    Calcium 4.8 (*)    Total Protein 3.1 (*)    Albumin 1.4 (*)    AST 77 (*)    ALT 45 (*)    All other components within normal limits  I-STAT ARTERIAL BLOOD GAS, ED - Abnormal; Notable for the following components:   pH, Arterial 7.120 (*)    pCO2 arterial 69.8 (*)    pO2, Arterial 195 (*)    Acid-base deficit 7.0 (*)    Potassium 5.5 (*)    HCT 28.0 (*)    Hemoglobin  9.5 (*)    All other components within normal limits  TROPONIN I (HIGH SENSITIVITY) - Abnormal; Notable for the following components:   Troponin I (High Sensitivity) 87 (*)    All other components within normal limits  RESP PANEL BY RT-PCR (FLU A&B, COVID) ARPGX2  CULTURE, BLOOD (ROUTINE X 2)  CULTURE, BLOOD (ROUTINE X 2)  URINE CULTURE  CULTURE, RESPIRATORY  BLOOD GAS, ARTERIAL  HIV ANTIBODY (ROUTINE TESTING W REFLEX)  CBC  MAGNESIUM  LACTIC ACID, PLASMA  LACTIC ACID, PLASMA  PROCALCITONIN  BRAIN NATRIURETIC PEPTIDE  CORTISOL  BASIC METABOLIC PANEL  CBG MONITORING, ED  TROPONIN I (HIGH SENSITIVITY)    EKG EKG Interpretation  Date/Time:  Sunday October 28 2020 02:53:57 EST Ventricular Rate:  124 PR Interval:    QRS Duration: 108 QT Interval:  355 QTC Calculation: 510 R Axis:   99 Text Interpretation: Sinus tachycardia Right axis deviation Nonspecific T abnrm, anterolateral leads Prolonged QT interval Interpretation limited secondary to artifact Confirmed by Ripley Fraise [phone]) on 10/16/2020 2:56:45 AM   Radiology No results found.  Procedures .Critical Care Performed by: Ripley Fraise, MD Authorized by: Ripley Fraise, MD   Critical care provider statement:    Critical care time (minutes):  35   Critical care start time:  11/09/2020 2:40 AM   Critical care end time:  11/08/2020 3:15 AM   Critical care time was exclusive of:  Separately billable procedures and treating other patients   Critical care was necessary to treat or prevent imminent or life-threatening deterioration of the following  conditions:  Respiratory failure and cardiac failure   Critical care was time spent personally by me on the following activities:  Discussions with consultants, evaluation of patient's response to treatment, examination of patient, review of old charts, re-evaluation of patient's condition, pulse oximetry, ordering and review of laboratory studies and ordering and  performing treatments and interventions   I assumed direction of critical care for this patient from another provider in my specialty: no     Care discussed with: admitting provider   Procedure Name: Intubation Date/Time: 11/05/2020 2:50 AM Performed by: Ripley Fraise, MD Preoxygenation: Pre-oxygenation with 100% oxygen Laryngoscope Size: Glidescope Grade View: Grade I Tube size: 7.5 mm Number of attempts: 1 Airway Equipment and Method: Video-laryngoscopy Placement Confirmation: ETT inserted through vocal cords under direct vision,  Breath sounds checked- equal and bilateral and CO2 detector Secured at: 23 cm       CPR Procedure Note I PERSONALLY DIRECTED ANCILLARY STAFF OR/PERFORMED CPR IN AN EFFORT TO REGAIN RETURN OF SPONTANEOUS CIRCULATION IN AN EFFORT TO MAINTAIN NEURO, CARDIAC AND SYSTEMIC PERFUSION  Medications Ordered in ED Medications  docusate (COLACE) 50 MG/5ML liquid 100 mg (has no administration in time range)  polyethylene glycol (MIRALAX / GLYCOLAX) packet 17 g (has no administration in time range)  heparin injection 5,000 Units (has no administration in time range)  pantoprazole (PROTONIX) injection 40 mg (has no administration in time range)  lactated ringers infusion (has no administration in time range)  potassium chloride 10 mEq in 50 mL *CENTRAL LINE* IVPB (has no administration in time range)  potassium chloride (KLOR-CON) packet 40 mEq (has no administration in time range)  EPINEPHrine (ADRENALIN) 1 MG/10ML injection (1 mg Intravenous Given 11/02/2020 0235)  sodium chloride 0.9 % bolus 1,000 mL (0 mLs Intravenous Stopped 10/30/2020 0408)  EPINEPHrine (ADRENALIN) 1 MG/10ML injection (1 Syringe Intravenous Given 10/22/2020 0251)  norepinephrine (LEVOPHED) 4mg  in 274mL premix infusion ( Intravenous Canceled Entry 11/11/2020 0300)  sodium bicarbonate injection (50 mEq Intravenous Given 10/17/2020 0253)    ED Course  I have reviewed the triage vital signs and the nursing  notes.  Pertinent labs & imaging results that were available during my care of the patient were reviewed by me and considered in my medical decision making (see chart for details).    MDM Rules/Calculators/A&P                          3:05 AM Patient seen on arrival for cardiac arrest.  She arrived with CPR in progress.  Patient was intubated immediately.  I removed the [facility] airway and intubated her without difficulty.  Significant blood was noted in the ET tube CPR was continued and patient was given epinephrine.  She regained pulses for a brief period time.  Patient then became profoundly hypotensive and lost pulses once again.  CPR was restarted, she was given epinephrine and bicarb and a Levophed drip started.  Patient has since regained spontaneous circulation.  I have endorsed the patient to critical care for admission. 3:38 AM Discussed the case with Dr. Carson Myrtle with critical care Patient is critically ill with a very grim prognosis. She had no gag reflex upon intubation, and her pupils are fixed and dilated.  Dr. Carson Myrtle and I are both concerned for brain death. Plan at this time will be to proceed with admission, and I have attempted to contact her daughter that is listed in the chart left a voicemail.  Final Clinical Impression(s) /  ED Diagnoses Final diagnoses:  Acute respiratory failure ([place])  Cardiac arrest [facility])    Rx / DC Orders ED Discharge Orders    None       Ripley Fraise, MD 10/22/2020 [phone]

## 2020-10-28 NOTE — Progress Notes (Signed)
   [facility]sde Physician Progress Note and Electrolyte Replacement  Patient Name: Meredith Church DOB: [birth year] MRN: [id number]  Date of Service  11/02/2020   HPI/Events of Note   Recent Labs  Lab 10/25/20 1455 10/22/2020 0306 10/17/2020 0418 10/22/2020 0525 10/25/2020 0723 10/25/2020 0908 11/09/2020 1215  NA 139 136 138  --  141 142 140  K 3.2* 2.7* 5.5*  --  4.2 3.2* 2.9*  CL 104 114*  --   --  106  --  107  CO2 26 12*  --   --  24  --  24  GLUCOSE 92 446*  --   --  205*  --  264*  BUN 7* 7*  --   --  14  --  15  CREATININE 0.65 0.62  --   --  1.29*  --  1.36*  CALCIUM 8.8* 4.8*  --   --  8.0*  --  8.1*  MG  --   --   --  2.1  --   --  1.9  PHOS  --   --   --   --   --   --  2.8    Estimated Creatinine Clearance: 39.2 mL/min (A) (by C-G formula based on SCr of 1.36 mg/dL (H)).  Intake/Output      02/12 0701 02/13 0700 02/13 0701 02/14 0700   I.V. (mL/kg) 138.8 (1.9) 659.5 (9.1)   NG/GT  110   IV Piggyback 1000 1005.2   Total Intake(mL/kg) 1138.8 (15.7) 1774.7 (24.4)   Emesis/NG output  50   Stool  50   Total Output  100   Net +1138.8 +1674.7         - I/O DETAILED x 24h    Total I/O In: 1774.7 [I.V.:659.5; NG/GT:110; IV Piggyback:1005.2] Out: 100 [Emesis/NG output:50; Stool:50] - I/O THIS SHIFT    ASSESSMENT Low k Low mag Has evolving AKI  eICURN Interventions  repete K and MAG   ASSESSMENT: MAJOR ELECTROLYTE      Dr. Brand Males, M.D., [facility].C.P Pulmonary and Critical Care Medicine Staff Physician [facility] Pager: [phone], If no answer or between  15:00h - 7:00h: call [phone]  10/27/2020 2:26 PM

## 2020-10-28 NOTE — Procedures (Signed)
Central Venous Catheter Insertion Procedure Note  Meredith Church  [id number]  05/07/1956  Date:11/12/2020  Time:4:00 AM   Provider Performing:Seong-Joo Carson Myrtle   Procedure: Insertion of Non-tunneled Central Venous Catheter(36556) without US guidance  Indication(s) Medication administration  Consent Unable to obtain consent due to emergent nature of procedure.  Anesthesia Topical only with 1% lidocaine   Timeout Verified patient identification, verified procedure, site/side was marked, verified correct patient position, special equipment/implants available, medications/allergies/relevant history reviewed, required imaging and test results available.  Sterile Technique Maximal sterile technique including full sterile barrier drape, hand hygiene, sterile gown, sterile gloves, mask, hair covering, sterile ultrasound probe cover (if used).  Procedure Description Area of catheter insertion was cleaned with chlorhexidine and draped in sterile fashion.  Without real-time ultrasound guidance a central venous catheter was placed into the right subclavian vein. Nonpulsatile blood flow and easy flushing noted in all ports.  The catheter was sutured in place and sterile dressing applied.  Complications/Tolerance None; patient tolerated the procedure well. Chest X-ray is ordered to verify placement for internal jugular or subclavian cannulation.  EBL Minimal  Specimen(s) None  Renee Pain, MD Board Certified by the ABIM, [place]

## 2020-10-28 NOTE — Progress Notes (Signed)
Trying to reach family. No answer yet

## 2020-10-28 NOTE — Progress Notes (Signed)
RT NOTE: Discussed ABG results with Dr. Chase Caller. Based on patient's current PF ratio (70), patient does not qualify for apnea testing at this time. FiO2 increased to 100% to allow for hyperoxygenation. Repeat ABG to be obtained this afternoon to see if patient qualifies for apnea test. Vitals are stable. RT will continue to monitor.

## 2020-10-28 NOTE — Progress Notes (Signed)
Pharmacy Antibiotic Note  Meredith Church is a 65 y.o. female admitted on 11/09/2020 s/p cardiac arrest, now with fevers and possible aspiration PNA.  Pharmacy has been consulted for Vancomycin  dosing.  Plan: Vancomycin 1500 mg IV now, then 1000 mg IV q24h  Height: 5\' 4"  (162.6 cm) Weight: 72.6 kg (160 lb) IBW/kg (Calculated) : 54.7  Temp (24hrs), Avg:94.7 F (34.8 C), Min:92.8 F (33.8 C), Max:100.94 F (38.3 C)  Recent Labs  Lab 10/25/20 1455 10/26/20 1115 10/29/2020 0306 10/23/2020 0525 10/30/2020 0540 11/08/2020 0723 11/05/2020 1215  WBC 8.6 9.9 14.8* 23.9*  --   --   --   CREATININE 0.65  --  0.62  --   --  1.29* 1.36*  LATICACIDVEN  --   --  9.8*  --  2.8*  --   --     Estimated Creatinine Clearance: 39.2 mL/min (A) (by C-G formula based on SCr of 1.36 mg/dL (H)).    Allergies  Allergen Reactions  . Other Other (See Comments)     Nickel "breaks my skin out" Fire ants  . Sulfa Antibiotics Swelling     Caryl Pina 10/20/2020 10:51 PM

## 2020-10-28 NOTE — Progress Notes (Signed)
Brief Nutrition Note  Consult received for enteral/tube feeding initiation and management.  Adult Enteral Nutrition Protocol initiated. Full assessment to follow.  Admitting Dx: Cardiac arrest ([place]) [I46.9] Acute respiratory failure ([place]) [J96.00] Anoxic encephalopathy ([place]) [G93.1] Encounter for orogastric (OG) tube placement [Z46.59]  Body mass index is 27.46 kg/m.   Labs:  Recent Labs  Lab 10/25/20 1455 11/04/2020 0306 10/30/2020 0418 11/06/2020 0525 10/27/2020 0723 11/07/2020 0908  NA 139 136 138  --  141 142  K 3.2* 2.7* 5.5*  --  4.2 3.2*  CL 104 114*  --   --  106  --   CO2 26 12*  --   --  24  --   BUN 7* 7*  --   --  14  --   CREATININE 0.65 0.62  --   --  1.29*  --   CALCIUM 8.8* 4.8*  --   --  8.0*  --   MG  --   --   --  2.1  --   --   GLUCOSE 92 446*  --   --  205*  --     Kerman Passey MS, RDN, LDN, CNSC Registered Dietitian III Clinical Nutrition RD Pager and On-Call Pager Number Located in [place]

## 2020-10-28 NOTE — Progress Notes (Signed)
[name] DOB: 1956/06/20 MRN: [id number]   Date of Service  10/27/2020  HPI/Events of Note  ABG 7.12/70/195/23/BE -7.  Vent Mode: PRVC FiO2 (%):  [100 %] 100 % Set Rate:  [20 bmp] 20 bmp Vt Set:  [430 mL-440 mL] 430 mL PEEP:  [5 cmH20] 5 cmH20 Plateau Pressure:  [16 cmH20-18 cmH20] 16 cmH20   eICU Interventions  Increase RR to 26 /min. Unlikely to tolerate higher respiratory rate than this due to COPD. Will need to monitor for dynamic hyperinflation.  Will also give 1 amp of NaHCO3.     Intervention Category Major Interventions: Acid-Base disturbance - evaluation and management;Respiratory failure - evaluation and management  Charlott Rakes 10/24/2020, 4:49 AM

## 2020-10-29 ENCOUNTER — Inpatient Hospital Stay (HOSPITAL_COMMUNITY): Payer: Medicare HMO

## 2020-10-29 ENCOUNTER — Inpatient Hospital Stay: Payer: Medicare HMO

## 2020-10-29 ENCOUNTER — Telehealth: Payer: Self-pay

## 2020-10-29 DIAGNOSIS — E872 Acidosis: Secondary | ICD-10-CM

## 2020-10-29 DIAGNOSIS — I469 Cardiac arrest, cause unspecified: Secondary | ICD-10-CM

## 2020-10-29 DIAGNOSIS — J9601 Acute respiratory failure with hypoxia: Secondary | ICD-10-CM | POA: Diagnosis not present

## 2020-10-29 LAB — CBC
HCT: 31.1 % — ABNORMAL LOW (ref 36.0–46.0)
Hemoglobin: 8.6 g/dL — ABNORMAL LOW (ref 12.0–15.0)
MCH: 26.5 pg (ref 26.0–34.0)
MCHC: 27.7 g/dL — ABNORMAL LOW (ref 30.0–36.0)
MCV: 95.7 fL (ref 80.0–100.0)
Platelets: 189 10*3/uL (ref 150–400)
RBC: 3.25 MIL/uL — ABNORMAL LOW (ref 3.87–5.11)
RDW: 21.5 % — ABNORMAL HIGH (ref 11.5–15.5)
WBC: 10.8 10*3/uL — ABNORMAL HIGH (ref 4.0–10.5)
nRBC: 0.3 % — ABNORMAL HIGH (ref 0.0–0.2)

## 2020-10-29 LAB — MAGNESIUM
Magnesium: 2.5 mg/dL — ABNORMAL HIGH (ref 1.7–2.4)
Magnesium: 2.3 mg/dL (ref 1.7–2.4)

## 2020-10-29 LAB — COMPREHENSIVE METABOLIC PANEL
ALT: 74 U/L — ABNORMAL HIGH (ref 0–44)
AST: 90 U/L — ABNORMAL HIGH (ref 15–41)
Albumin: 2.6 g/dL — ABNORMAL LOW (ref 3.5–5.0)
Alkaline Phosphatase: 116 U/L (ref 38–126)
Anion gap: 13 (ref 5–15)
BUN: 20 mg/dL (ref 8–23)
CO2: 23 mmol/L (ref 22–32)
Calcium: 8.3 mg/dL — ABNORMAL LOW (ref 8.9–10.3)
Chloride: 110 mmol/L (ref 98–111)
Creatinine, Ser: 1.5 mg/dL — ABNORMAL HIGH (ref 0.44–1.00)
GFR, Estimated: 38 mL/min — ABNORMAL LOW (ref >60)
Glucose, Bld: 200 mg/dL — ABNORMAL HIGH (ref 70–99)
Potassium: 4.1 mmol/L (ref 3.5–5.1)
Sodium: 146 mmol/L — ABNORMAL HIGH (ref 135–145)
Total Bilirubin: 0.9 mg/dL (ref 0.3–1.2)
Total Protein: 6.2 g/dL — ABNORMAL LOW (ref 6.5–8.1)

## 2020-10-29 LAB — ECHOCARDIOGRAM LIMITED
Area-P 1/2: 9.37 cm2
Height: 64 in
Radius: 0.5 cm
S' Lateral: 4.1 cm
Weight: 2719.59 oz

## 2020-10-29 LAB — PROTIME-INR
INR: 1.3 — ABNORMAL HIGH (ref 0.8–1.2)
Prothrombin Time: 15.6 seconds — ABNORMAL HIGH (ref 11.4–15.2)

## 2020-10-29 LAB — URINE CULTURE: Culture: NO GROWTH

## 2020-10-29 LAB — GLUCOSE, CAPILLARY
Glucose-Capillary: 131 mg/dL — ABNORMAL HIGH (ref 70–99)
Glucose-Capillary: 143 mg/dL — ABNORMAL HIGH (ref 70–99)
Glucose-Capillary: 161 mg/dL — ABNORMAL HIGH (ref 70–99)
Glucose-Capillary: 166 mg/dL — ABNORMAL HIGH (ref 70–99)
Glucose-Capillary: 174 mg/dL — ABNORMAL HIGH (ref 70–99)
Glucose-Capillary: 202 mg/dL — ABNORMAL HIGH (ref 70–99)
Glucose-Capillary: 235 mg/dL — ABNORMAL HIGH (ref 70–99)

## 2020-10-29 LAB — PHOSPHORUS
Phosphorus: 3.8 mg/dL (ref 2.5–4.6)
Phosphorus: 3.7 mg/dL (ref 2.5–4.6)

## 2020-10-29 LAB — D-DIMER, QUANTITATIVE: D-Dimer, Quant: >20 ug/mL-FEU — ABNORMAL HIGH (ref 0.00–0.50)

## 2020-10-29 LAB — TROPONIN I (HIGH SENSITIVITY)
Troponin I (High Sensitivity): 382 ng/L (ref <18)
Troponin I (High Sensitivity): 429 ng/L (ref <18)

## 2020-10-29 MED ORDER — LACTATED RINGERS IV BOLUS
500.0000 mL | Freq: Once | INTRAVENOUS | Status: AC
  Administered 2020-10-29: 500 mL via INTRAVENOUS

## 2020-10-29 MED ORDER — METOPROLOL TARTRATE 5 MG/5ML IV SOLN
2.5000 mg | Freq: Once | INTRAVENOUS | Status: AC
  Administered 2020-10-29: 2.5 mg via INTRAVENOUS
  Filled 2020-10-29: qty 5

## 2020-10-29 MED ORDER — VITAL AF 1.2 CAL PO LIQD
1000.0000 mL | ORAL | Status: DC
  Administered 2020-10-29: 1000 mL

## 2020-10-29 MED ORDER — METOPROLOL TARTRATE 5 MG/5ML IV SOLN
2.5000 mg | INTRAVENOUS | Status: DC | PRN
  Administered 2020-10-29 – 2020-10-31 (×3): 2.5 mg via INTRAVENOUS
  Filled 2020-10-29 (×3): qty 5

## 2020-10-29 NOTE — Progress Notes (Signed)
EEG completed, result pending

## 2020-10-29 NOTE — Telephone Encounter (Signed)
-----   Message from Milus Banister, MD sent at 10/26/2020  5:33 PM EST ----- Not a bother at all. We'll get her in much sooner than April. I'll let you know how it goes.  Devontre Siedschlag, CAn you book her for an appt with me, sometime next week, double book if needed.  Also if any sooner appointments open up earlier then see if she can come in.  Thanks  DJ  ----- Message ----- From: Benay Pike, MD Sent: 10/26/2020   4:21 PM EST To: Milus Banister, MD  Sorry to bother you again, pt calls back. She says she cant see u till April. We will give her a unit of blood and also do a BMB If you can see her any sooner,t hat would be very helpful.

## 2020-10-29 NOTE — Progress Notes (Signed)
NAME:  Meredith Church, MRN:  [id number], DOB:  [birth year], LOS: 1 ADMISSION DATE:  10/25/2020, CONSULTATION DATE:  11/08/2020 REFERRING MD:  Christy Gentles - EM CHIEF COMPLAINT:  Cardiac arrest   Brief History:  65 yo F s/p multiple cardiac arrest, 7 min downtime OOH before ROSC and >30 min downtime in ED before ROSC.   History of Present Illness:  This 65 y.o. African American female presented to the [facility] Emergency Department via EMS with complaints of cardiac arrest.  She reportedly called 911 and was found alone at home to be puslsess and apenic.  CPR initiated in the field for PEA arrest.  Initially, ROSC was obtained after 7 min of CPR, only to lose pulse and pressure 3 min later.  She was transported to the ER with CPR in progress en route.  She arrived in PEA arrest.  Case was discussed with Dr. Christy Gentles, who reports that over 30 min of CPR was required to achieve ROSC.  At the time of clinical interview, the patient is intubated and unreponsive to any stimuli.  No RSI medications were administered for intubation.  She is on norepinephrine infusion for BP support through a peripheral IV. She is synchronous with the ventilator.  No Doll's eyes.  No corneal reflex.  No cough/gag.  Past Medical History:  has a past medical history of Anemia, Anxiety (Dx 2007), Arthritis, Blood transfusion without reported diagnosis, COPD (chronic obstructive pulmonary disease) ([place]), Depression, Eczema, Hyperlipidemia (Dx 2010), Hypertension (Dx 2010), and Numbness and tingling of lower extremity.   has a past surgical history that includes Tibia fracture surgery; Abdominal hysterectomy; Lipoma excision; Salivary gland surgery; Lumbar laminectomy/decompression microdiscectomy (N/A, 07/03/2016); sciatica; Fracture surgery; Total hip arthroplasty (Right, 02/24/2017); Colonoscopy; Upper gastrointestinal endoscopy; and Polypectomy.  Significant Hospital Events:  2/13 admitted s/p cardiac arrests  with rsoc   Consults:  Neuro   Procedures:  2/13 ETT>>   Significant Diagnostic Tests:  Ct H 2/13> Diffuse edema with brainstem and cerebellum involvement, effaced 3 and 4th ventricles. Reversal of normal supratentorial grey white matter differentiateion without midline shift   Micro Data:  COVID neg   Antimicrobials:  Unasyn 2/13> Vanc 2/13   Interim History / Subjective:  Variable temp -- has largely been hypothermic Was talk of pursuing apnea test yesterday but this was limited by temp.  Today is initiating breaths.  Objective   Blood pressure (!) 89/66, pulse (!) 114, temperature (!) 94.46 F (34.7 C), resp. rate (!) 26, height 5\' 4"  (1.626 m), weight 77.1 kg, SpO2 91 %.    Vent Mode: PRVC FiO2 (%):  [50 %-100 %] 100 % Set Rate:  [20 bmp-26 bmp] 26 bmp Vt Set:  [430 mL] 430 mL PEEP:  [5 cmH20] 5 cmH20 Plateau Pressure:  [17 cmH20-18 cmH20] 17 cmH20   Intake/Output Summary (Last 24 hours) at 10/29/2020 0730 Last data filed at 10/29/2020 0865 Gross per 24 hour  Intake 4272.95 ml  Output 2250 ml  Net 2022.95 ml   Filed Weights   10/19/2020 0243 10/29/20 0500  Weight: 72.6 kg 77.1 kg    Examination: General: Critically ill appearing older adult M intubated sedated NAD HENT: NCAT. Pink mm. Tongue is flaccid. ETT is secure  Lungs: CTAb. Symmetrical chest expansion. Mechanically ventilated.  Cardiovascular: tachy rate reg rhythm s1s2 no rgm cap refill brisk  Abdomen: soft round ndnt  Extremities: no obvious joint deformity no cyanosis or clubbing.  Neuro: Pupils are fixed 16mm. No cough, no  gag. Is initiating breaths  GU: foley   Resolved Hospital Problem list     Assessment & Plan:   S/p cardiac arrests with ROSC -1x OOH with ROSC after 7 min, 1x PEA in ED with ROSC > 30 min  Circulatory shock  P - supportive care - NE for MAP > 65   Anoxic encephalopathy  -CT H  2/13 with findings concerning for anoxic brain injury  P -continue off sedation -goal  normothermia, normoglycemic, correct metabolic abnormalities as able -monitor neuro status closely  -neuro has been consulted, appreciate recs   Acute respiratory failure requiring MV in setting of cardiac arrest  P -continue MV -adjust vent as needed to optimize gas exchange, oxygenation -VAP, pulm hygiene -no PAD    AKI  -suspect prerenal due to cardiac arrest, shock P -trend renal indices -mIVF  -trend UOP   Transaminitis  -suspect in setting of cardiac arrest, shock P -trend LFTs PRN   Tachycardia PVCs,  P -ICU monitoring -optimize electrolytes -1x PRN metop 2.5mg    Hypokalemia, improved Hypomagnesemia, improved P -trend, replace PRN   Leukocytosis, improving -likely reactive, however aspiration event possible in setting of cardiac arrest P -empiric unasyn. Dc vanc   Macrocytic anemia  -no evidence of acute bleeding   Hyperglycemia P -SSI   Goals of Care P -cont attempts at reaching family  -may need social work consult  Best practice (evaluated daily)  Diet: NPO Pain/Anxiety/Delirium protocol (if indicated): na  VAP protocol (if indicated): yes DVT prophylaxis: SQH  GI prophylaxis: protonix  Glucose control: SSI Mobility: BR  Disposition:ICU   Goals of Care:  Last date of multidisciplinary goals of care discussion:  --  Family and staff present: -- Summary of discussion: -- Follow up goals of care discussion due: 2/20  Code Status: Full   Family Communication: pending-- attempts to reach family have not been successful. Daughter Lynita Groseclose is listed in chart : [id number].   Labs   CBC: Recent Labs  Lab 10/25/20 1455 10/26/20 1115 10/19/2020 0306 11/10/2020 0418 10/18/2020 0525 11/03/2020 0908 10/30/2020 1439 10/29/20 0448  WBC 8.6 9.9 14.8*  --  23.9*  --   --  10.8*  NEUTROABS 5.4 6.8  --   --   --   --   --   --   HGB 7.7* 8.3* 8.1* 9.5* 8.6* 10.5* 9.9* 8.6*  HCT 27.8* 30.1* 30.4* 28.0* 30.4* 31.0* 29.0* 31.1*  MCV 93.3 94.1  102.7*  --  98.7  --   --  95.7  PLT 278 279 228  --  306  --   --  778    Basic Metabolic Panel: Recent Labs  Lab 10/25/20 1455 10/27/2020 0306 11/03/2020 0418 11/08/2020 0525 10/24/2020 0723 10/24/2020 0908 10/30/2020 1215 10/22/2020 1439 11/05/2020 1759 10/29/20 0448  NA 139 136   < >  --  141 142 140 142  --  146*  K 3.2* 2.7*   < >  --  4.2 3.2* 2.9* 3.1*  --  4.1  CL 104 114*  --   --  106  --  107  --   --  110  CO2 26 12*  --   --  24  --  24  --   --  23  GLUCOSE 92 446*  --   --  205*  --  264*  --   --  200*  BUN 7* 7*  --   --  14  --  15  --   --  20  CREATININE 0.65 0.62  --   --  1.29*  --  1.36*  --   --  1.50*  CALCIUM 8.8* 4.8*  --   --  8.0*  --  8.1*  --   --  8.3*  MG  --   --   --  2.1  --   --  1.9  --  1.7 2.5*  PHOS  --   --   --   --   --   --  2.8  --  3.3 3.8   < > = values in this interval not displayed.   GFR: Estimated Creatinine Clearance: 36.6 mL/min (A) (by C-G formula based on SCr of 1.5 mg/dL (H)). Recent Labs  Lab 10/26/20 1115 10/22/2020 0306 11/09/2020 0525 10/22/2020 0540 10/29/20 0448  PROCALCITON  --   --  0.47  --   --   WBC 9.9 14.8* 23.9*  --  10.8*  LATICACIDVEN  --  9.8*  --  2.8*  --     Liver Function Tests: Recent Labs  Lab 10/25/20 1455 11/07/2020 0306 10/29/20 0448  AST 14* 77* 90*  ALT 7 45* 74*  ALKPHOS 99 75 116  BILITOT 0.4 0.7 0.9  PROT 7.8 3.1* 6.2*  ALBUMIN 3.7 1.4* 2.6*   No results for input(s): LIPASE, AMYLASE in the last 168 hours. No results for input(s): AMMONIA in the last 168 hours.  ABG    Component Value Date/Time   PHART 7.363 10/22/2020 1439   PCO2ART 44.8 10/30/2020 1439   PO2ART 272 (H) 11/10/2020 1439   HCO3 26.4 10/16/2020 1439   TCO2 28 10/27/2020 1439   ACIDBASEDEF 1.0 10/20/2020 0908   O2SAT 100.0 10/26/2020 1439     Coagulation Profile: Recent Labs  Lab 10/23/2020 0306 10/29/20 0448  INR 1.5* 1.3*    Cardiac Enzymes: No results for input(s): CKTOTAL, CKMB, CKMBINDEX, TROPONINI in the  last 168 hours.  HbA1C: Hemoglobin A1C  Date/Time Value Ref Range Status  10/03/2013 02:47 PM 5.9  Final   Hgb A1c MFr Bld  Date/Time Value Ref Range Status  10/21/2020 05:59 PM 8.9 (H) 4.8 - 5.6 % Final    Comment:    (NOTE) Pre diabetes:          5.7%-6.4%  Diabetes:              >6.4%  Glycemic control for   <7.0% adults with diabetes     CBG: Recent Labs  Lab 11/05/2020 1658 11/01/2020 2035 10/30/2020 2337 10/29/20 0326  GLUCAP 253* 192* 142* 166*     Critical care time: 50 minutes     CRITICAL CARE Performed by: Cristal Generous   Total critical care time: 50 minutes  Critical care time was exclusive of separately billable procedures and treating other patients. Critical care was necessary to treat or prevent imminent or life-threatening deterioration.  Critical care was time spent personally by me on the following activities: development of treatment plan with patient and/or surrogate as well as nursing, discussions with consultants, evaluation of patient's response to treatment, examination of patient, obtaining history from patient or surrogate, ordering and performing treatments and interventions, ordering and review of laboratory studies, ordering and review of radiographic studies, pulse oximetry and re-evaluation of patient's condition.  Eliseo Gum MSN, AGACNP-BC [place] [phone] If no answer, [phone] 10/29/2020, 10:40 AM

## 2020-10-29 NOTE — Progress Notes (Addendum)
PCCM Family communication note  Tried to reach daughter at # in chart, unable to reach. TOC consult placed  Eliseo Gum MSN, AGACNP-BC [place] [phone] If no answer, [phone] 10/29/2020, 3:06 PM

## 2020-10-29 NOTE — Progress Notes (Signed)
  Echocardiogram 2D Echocardiogram has been performed.  Meredith Church 10/29/2020, 2:22 PM

## 2020-10-29 NOTE — Procedures (Signed)
Patient Name: Meredith Church  MRN: [id number]  Epilepsy Attending: Lora Havens  Referring Physician/Provider: Eliseo Gum, NP Date: 10/29/2020 Duration: 26.08 mins  Patient history: 65yo F s/p cardiac arrest. EEG to evaluate for seizure  Level of alertness:  Comatose  AEDs during EEG study: None  Technical aspects: This EEG study was done with scalp electrodes positioned according to the 10-20 International system of electrode placement. Electrical activity was acquired at a sampling rate of 500Hz  and reviewed with a high frequency filter of 70Hz  and a low frequency filter of 1Hz . EEG data were recorded continuously and digitally stored.   Description: EEG showed continuous generalized background suppression. EEG was not reactive to noxious stimulation.  Hyperventilation and photic stimulation were not performed.     ABNORMALITY - Background suppression, generalized  IMPRESSION: This study is suggestive of profound diffuse encephalopathy, nonspecific etiology but likely related to anoxic/hypoxic brain injury. No seizures or epileptiform discharges were seen throughout the recording.  [name]

## 2020-10-29 NOTE — Progress Notes (Signed)
Initial Nutrition Assessment  DOCUMENTATION CODES:   Not applicable  INTERVENTION:   Initiate tube feeding via OG tube: Vital 1.2 at 60 ml/h (1440 ml per day)  Provides 1728 kcal, 108 gm protein, 1167 ml free water daily   NUTRITION DIAGNOSIS:   Inadequate oral intake related to inability to eat as evidenced by NPO status.  GOAL:   Patient will meet greater than or equal to 90% of their needs  MONITOR:   TF tolerance  REASON FOR ASSESSMENT:   Consult,Ventilator Enteral/tube feeding initiation and management  ASSESSMENT:   Pt with PMH of COPD, depression, and HTN admitted from home with multiple OOH cardiac arrests with >30 minutes of CPR.   MD continues to attempt to reach family.  Head CT shows severe cerebral/brain edema with brain herniation consistent with anoxic injury.   Patient is currently intubated on ventilator support MV: 11.1 L/min Temp (24hrs), Avg:98.3 F (36.8 C), Min:94.46 F (34.7 C), Max:101.12 F (38.4 C)  Medications reviewed and include: SSI Labs reviewed: troponin: 429  OG tube, tip in gastric lumen   Diet Order:   Diet Order            Diet NPO time specified  Diet effective now                 EDUCATION NEEDS:   No education needs have been identified at this time  Skin:  Skin Assessment: Reviewed RN Assessment  Last BM:  50 ml rectal tube  Height:   Ht Readings from Last 1 Encounters:  10/20/2020 5\' 4"  (1.626 m)    Weight:   Wt Readings from Last 1 Encounters:  10/29/20 77.1 kg    Ideal Body Weight:  54.4 kg  BMI:  Body mass index is 29.18 kg/m.  Estimated Nutritional Needs:   Kcal:  1700  Protein:  85-100 grams  Fluid:  >1.7 L/day  Lockie Pares., RD, LDN, CNSC See AMiON for contact information

## 2020-10-30 ENCOUNTER — Inpatient Hospital Stay (HOSPITAL_COMMUNITY): Payer: Medicare HMO

## 2020-10-30 DIAGNOSIS — J9600 Acute respiratory failure, unspecified whether with hypoxia or hypercapnia: Secondary | ICD-10-CM | POA: Diagnosis not present

## 2020-10-30 DIAGNOSIS — I6782 Cerebral ischemia: Secondary | ICD-10-CM | POA: Diagnosis not present

## 2020-10-30 DIAGNOSIS — G931 Anoxic brain damage, not elsewhere classified: Secondary | ICD-10-CM | POA: Diagnosis not present

## 2020-10-30 LAB — CBC
HCT: 27.3 % — ABNORMAL LOW (ref 36.0–46.0)
Hemoglobin: 7.4 g/dL — ABNORMAL LOW (ref 12.0–15.0)
MCH: 27.3 pg (ref 26.0–34.0)
MCHC: 27.1 g/dL — ABNORMAL LOW (ref 30.0–36.0)
MCV: 100.7 fL — ABNORMAL HIGH (ref 80.0–100.0)
Platelets: 164 10*3/uL (ref 150–400)
RBC: 2.71 MIL/uL — ABNORMAL LOW (ref 3.87–5.11)
RDW: 22.5 % — ABNORMAL HIGH (ref 11.5–15.5)
WBC: 8.6 10*3/uL (ref 4.0–10.5)
nRBC: 0.5 % — ABNORMAL HIGH (ref 0.0–0.2)

## 2020-10-30 LAB — COMPREHENSIVE METABOLIC PANEL
ALT: 45 U/L — ABNORMAL HIGH (ref 0–44)
AST: 37 U/L (ref 15–41)
Albumin: 2.3 g/dL — ABNORMAL LOW (ref 3.5–5.0)
Alkaline Phosphatase: 100 U/L (ref 38–126)
Anion gap: 11 (ref 5–15)
BUN: 32 mg/dL — ABNORMAL HIGH (ref 8–23)
CO2: 25 mmol/L (ref 22–32)
Calcium: 8.6 mg/dL — ABNORMAL LOW (ref 8.9–10.3)
Chloride: 119 mmol/L — ABNORMAL HIGH (ref 98–111)
Creatinine, Ser: 1.47 mg/dL — ABNORMAL HIGH (ref 0.44–1.00)
GFR, Estimated: 39 mL/min — ABNORMAL LOW (ref >60)
Glucose, Bld: 222 mg/dL — ABNORMAL HIGH (ref 70–99)
Potassium: 3.9 mmol/L (ref 3.5–5.1)
Sodium: 155 mmol/L — ABNORMAL HIGH (ref 135–145)
Total Bilirubin: 0.2 mg/dL — ABNORMAL LOW (ref 0.3–1.2)
Total Protein: 5.6 g/dL — ABNORMAL LOW (ref 6.5–8.1)

## 2020-10-30 LAB — GLUCOSE, CAPILLARY
Glucose-Capillary: 175 mg/dL — ABNORMAL HIGH (ref 70–99)
Glucose-Capillary: 178 mg/dL — ABNORMAL HIGH (ref 70–99)
Glucose-Capillary: 185 mg/dL — ABNORMAL HIGH (ref 70–99)
Glucose-Capillary: 201 mg/dL — ABNORMAL HIGH (ref 70–99)
Glucose-Capillary: 215 mg/dL — ABNORMAL HIGH (ref 70–99)
Glucose-Capillary: 255 mg/dL — ABNORMAL HIGH (ref 70–99)

## 2020-10-30 LAB — BPAM RBC
Blood Product Expiration Date: 202203052359
Unit Type and Rh: 6200

## 2020-10-30 LAB — TYPE AND SCREEN
ABO/RH(D): A POS
Antibody Screen: NEGATIVE
Unit division: 0

## 2020-10-30 NOTE — Progress Notes (Signed)
NAME:  Meredith Church, MRN:  [id number], DOB:  Jan 29, 1956, LOS: 2 ADMISSION DATE:  11/05/2020, CONSULTATION DATE:  10/25/2020 REFERRING MD:  Meredith Church - EM CHIEF COMPLAINT:  Cardiac arrest   Brief History:  65 yo F s/p multiple cardiac arrest, 7 min downtime OOH before ROSC and >30 min downtime in ED before ROSC.   History of Present Illness:  This 65 y.o. African American female presented to the [facility] Emergency Department via EMS with complaints of cardiac arrest.  She reportedly called 911 and was found alone at home to be puslsess and apenic.  CPR initiated in the field for PEA arrest.  Initially, ROSC was obtained after 7 min of CPR, only to lose pulse and pressure 3 min later.  She was transported to the ER with CPR in progress en route.  She arrived in PEA arrest.  Case was discussed with Dr. Christy Church, who reports that over 30 min of CPR was required to achieve ROSC.  At the time of clinical interview, the patient is intubated and unreponsive to any stimuli.  No RSI medications were administered for intubation.  She is on norepinephrine infusion for BP support through a peripheral IV. She is synchronous with the ventilator.  No Doll's eyes.  No corneal reflex.  No cough/gag.  Past Medical History:  has a past medical history of Anemia, Anxiety (Dx 2007), Arthritis, Blood transfusion without reported diagnosis, COPD (chronic obstructive pulmonary disease) ([place]), Depression, Eczema, Hyperlipidemia (Dx 2010), Hypertension (Dx 2010), and Numbness and tingling of lower extremity.   has a past surgical history that includes Tibia fracture surgery; Abdominal hysterectomy; Lipoma excision; Salivary gland surgery; Lumbar laminectomy/decompression microdiscectomy (N/A, 07/03/2016); sciatica; Fracture surgery; Total hip arthroplasty (Right, 02/24/2017); Colonoscopy; Upper gastrointestinal endoscopy; and Polypectomy.  Significant Hospital Events:  2/13 admitted s/p cardiac arrests  with rsoc   Consults:  Neuro   Procedures:  2/13 ETT>>   Significant Diagnostic Tests:  Ct H 2/13> Diffuse edema with brainstem and cerebellum involvement, effaced 3 and 4th ventricles. Reversal of normal supratentorial grey white matter differentiateion without midline shift   Micro Data:  COVID neg   Antimicrobials:  Unasyn 2/13> Vanc 2/13   Interim History / Subjective:  Is not initiating breaths   Objective   Blood pressure (!) 87/56, pulse (!) 128, temperature 98.42 F (36.9 C), resp. rate (!) 26, height 5\' 4"  (1.626 m), weight 77.1 kg, SpO2 100 %.    Vent Mode: PRVC FiO2 (%):  [100 %] 100 % Set Rate:  [26 bmp] 26 bmp Vt Set:  [430 mL] 430 mL PEEP:  [8 cmH20] 8 cmH20 Plateau Pressure:  [21 cmH20-24 cmH20] 24 cmH20   Intake/Output Summary (Last 24 hours) at 10/30/2020 0841 Last data filed at 10/30/2020 0600 Gross per 24 hour  Intake 1884.35 ml  Output 2375 ml  Net -490.65 ml   Filed Weights   10/17/2020 0243 10/29/20 0500  Weight: 72.6 kg 77.1 kg    Examination: General: Critically ill appearing middle aged F, intubated,  No sedation  HENT: NCAT Pink mm trachea midline  Lungs: MV sounds. Symmetrical chest expansion. Diminished bibasilar sounds  Cardiovascular: tachycardic. s1s2 cap refill brisk  Abdomen: soft round ndnt  Extremities:no obvious deformity no cyanosis or clubbing.  Neuro: Pupils 51mm fixed. No cough, gag, does not trigger vent. No corneal reflex No response to painful stimuli.  GU: foley  Resolved Hospital Problem list     Assessment & Plan:  GOC Daughter, Meredith Church called the unit. I explained the patient's multiple cardiac arrests, significant anoxic injury, and current interventions.  We did not discuss code status or further GOC over the phone, as daughter is local and plans to come to hospital immediately.  Further GOC at that time  S/p cardiac arrests with ROSC -1x OOH with ROSC after 7 min, 1x PEA in ED with ROSC > 30 min   Circulatory shock  P - Supportive care - NE for MAP > 65  Anoxic encephalpathy  -CT H  2/13 with findings concerning for anoxic brain injury  -EEG 2/14 with findings concerning for anoxic injury  P -continue off sedation -neuro exam is poor, follow closely -metabolic panel pending-- apnea test following this (pending convo w family)  -neuro consulted  Acute respiratory failure requiring intubation, in setting of cardiac arrest  P -Cont MV -VAP, pulm hygiene -no PAD   AKI -suspect prerenal due to cardiac arrest, shock P -trend renal indices -- CMP pending 2/15  Transaminitis  -suspect in setting of cardiac arrest, shock P -trend LFTs PRN   Tachycardia PVCs,  P -ICU monitoring -optimize electrolytes -1x PRN metop 2.5mg    Hypokalemia, improved Hypomagnesemia, improved P -trend, replace PRN   Leukocytosis, improving  -likely reactive, however aspiration event possible in setting of cardiac arrest P -cont unasyn   Macrocytic anemia -trend H/H  Hyperglycemia  P -SSI    Best practice (evaluated daily)  Diet: NPO Pain/Anxiety/Delirium protocol (if indicated): na  VAP protocol (if indicated): yes DVT prophylaxis: SQH  GI prophylaxis: protonix  Glucose control: SSI Mobility: BR  Disposition:ICU   Goals of Care:  Last date of multidisciplinary goals of care discussion:  --  Family and staff present: -- Summary of discussion: -- Follow up goals of care discussion due: 2/20  Code Status: Full  Family Communication: Meredith Church updated 2/15. Updated number now in chart  Labs   CBC: Recent Labs  Lab 10/25/20 1455 10/26/20 1115 11/08/2020 0306 10/29/2020 0418 11/01/2020 0525 11/07/2020 0908 11/02/2020 1439 10/29/20 0448  WBC 8.6 9.9 14.8*  --  23.9*  --   --  10.8*  NEUTROABS 5.4 6.8  --   --   --   --   --   --   HGB 7.7* 8.3* 8.1* 9.5* 8.6* 10.5* 9.9* 8.6*  HCT 27.8* 30.1* 30.4* 28.0* 30.4* 31.0* 29.0* 31.1*  MCV 93.3 94.1 102.7*  --  98.7  --   --  95.7   PLT 278 279 228  --  306  --   --  338    Basic Metabolic Panel: Recent Labs  Lab 10/25/20 1455 11/08/2020 0306 11/08/2020 0418 11/12/2020 0525 11/06/2020 0723 10/30/2020 0908 10/17/2020 1215 11/10/2020 1439 10/30/2020 1759 10/29/20 0448 10/29/20 1652  NA 139 136   < >  --  141 142 140 142  --  146*  --   K 3.2* 2.7*   < >  --  4.2 3.2* 2.9* 3.1*  --  4.1  --   CL 104 114*  --   --  106  --  107  --   --  110  --   CO2 26 12*  --   --  24  --  24  --   --  23  --   GLUCOSE 92 446*  --   --  205*  --  264*  --   --  200*  --   BUN 7* 7*  --   --  14  --  15  --   --  20  --   CREATININE 0.65 0.62  --   --  1.29*  --  1.36*  --   --  1.50*  --   CALCIUM 8.8* 4.8*  --   --  8.0*  --  8.1*  --   --  8.3*  --   MG  --   --   --  2.1  --   --  1.9  --  1.7 2.5* 2.3  PHOS  --   --   --   --   --   --  2.8  --  3.3 3.8 3.7   < > = values in this interval not displayed.   GFR: Estimated Creatinine Clearance: 36.6 mL/min (A) (by C-G formula based on SCr of 1.5 mg/dL (H)). Recent Labs  Lab 10/26/20 1115 10/30/2020 0306 10/27/2020 0525 10/20/2020 0540 10/29/20 0448  PROCALCITON  --   --  0.47  --   --   WBC 9.9 14.8* 23.9*  --  10.8*  LATICACIDVEN  --  9.8*  --  2.8*  --     Liver Function Tests: Recent Labs  Lab 10/25/20 1455 10/21/2020 0306 10/29/20 0448  AST 14* 77* 90*  ALT 7 45* 74*  ALKPHOS 99 75 116  BILITOT 0.4 0.7 0.9  PROT 7.8 3.1* 6.2*  ALBUMIN 3.7 1.4* 2.6*   No results for input(s): LIPASE, AMYLASE in the last 168 hours. No results for input(s): AMMONIA in the last 168 hours.  ABG    Component Value Date/Time   PHART 7.363 11/12/2020 1439   PCO2ART 44.8 10/18/2020 1439   PO2ART 272 (H) 11/02/2020 1439   HCO3 26.4 10/26/2020 1439   TCO2 28 10/19/2020 1439   ACIDBASEDEF 1.0 10/29/2020 0908   O2SAT 100.0 10/22/2020 1439     Coagulation Profile: Recent Labs  Lab 10/18/2020 0306 10/29/20 0448  INR 1.5* 1.3*    Cardiac Enzymes: No results for input(s): CKTOTAL,  CKMB, CKMBINDEX, TROPONINI in the last 168 hours.  HbA1C: Hemoglobin A1C  Date/Time Value Ref Range Status  10/03/2013 02:47 PM 5.9  Final   Hgb A1c MFr Bld  Date/Time Value Ref Range Status  11/11/2020 05:59 PM 8.9 (H) 4.8 - 5.6 % Final    Comment:    (NOTE) Pre diabetes:          5.7%-6.4%  Diabetes:              >6.4%  Glycemic control for   <7.0% adults with diabetes     CBG: Recent Labs  Lab 10/29/20 1514 10/29/20 2005 10/29/20 2346 10/30/20 0341 10/30/20 0754  GLUCAP 131* 161* 174* 175* 178*     Critical care time    CRITICAL CARE Performed by: Cristal Generous   Total critical care time: 50 minutes  Critical care time was exclusive of separately billable procedures and treating other patients.  Critical care was necessary to treat or prevent imminent or life-threatening deterioration.  Critical care was time spent personally by me on the following activities: development of treatment plan with patient and/or surrogate as well as nursing, discussions with consultants, evaluation of patient's response to treatment, examination of patient, obtaining history from patient or surrogate, ordering and performing treatments and interventions, ordering and review of laboratory studies, ordering and review of radiographic studies, pulse oximetry and re-evaluation of patient's condition.  Eliseo Gum MSN, AGACNP-BC [place] [phone] If no answer,  [phone] 10/30/2020, 8:41 AM

## 2020-10-30 NOTE — Progress Notes (Addendum)
PCCM Family Communication Note  Met with daughter, Meredith Church, at bedside-- joined by [facility] attending and patient's primary RN. Discussed clinical status and poor prognosis, signs of brain death vs impending brain death (have not performed brain death testing but clinical suspicion is very high at this time), and that the pt will not survive this hospitalization.   Emotional support provided.   Discussed transition to end of life care, with liberation from MV and comfort measures-- we will continue current lines of management until family can arrive.   Followed up with daughter. Emotional support provided  And d iscussed code status with the daughter-- Code status updated to DNR   Eliseo Gum MSN, AGACNP-BC [place] [phone] If no answer, [phone] 10/30/2020, 1:27 PM

## 2020-10-30 NOTE — Progress Notes (Signed)
Assisted tele visit to patient with family member.  Lexy Meininger P, RN  

## 2020-10-30 NOTE — Telephone Encounter (Signed)
Left message on machine to call back  

## 2020-10-31 LAB — GLUCOSE, CAPILLARY: Glucose-Capillary: 175 mg/dL — ABNORMAL HIGH (ref 70–99)

## 2020-11-02 LAB — CULTURE, BLOOD (ROUTINE X 2)
Culture: NO GROWTH
Culture: NO GROWTH
Special Requests: ADEQUATE

## 2020-11-09 ENCOUNTER — Ambulatory Visit (HOSPITAL_COMMUNITY): Payer: Medicare HMO

## 2020-11-13 ENCOUNTER — Ambulatory Visit (HOSPITAL_COMMUNITY): Payer: Medicare HMO

## 2020-11-13 NOTE — Death Summary Note (Addendum)
Meredith Church was a 65 y.o. female smoker admitted on 10/29/2020 PEA cardiac arrest.  Required about 25 minutes total for ROSC.  Required intubation.  Started on pressors.  Started on antibiotics for aspiration pneumonitis.  Concern for anoxic encephalopathy and neurology consulted.  CT head showed evidence for severe anoxic brain injury.  Family informed of findings and patient transitioned to DNR status.  She developed asystole on 11-25-20 and pronounced dead at 7:58 AM.  Final diagnoses: PEA cardiac arrest, cause clinically undetermined Coronary atherosclerosis Acute anoxic encephalopathy Acute hypoxic respiratory failure Aspiration pneumonitis Cardiogenic shock AKI from ATN 2nd to hypotension and hypoxia Elevated LFTs from hypoxic/ischemic injury Hypokalemia Hypomagnesemia Leukocytosis Hyperglycemia Macrocytic anemia Hypernatremia Hx of hypertension, COPD, Depression, Hyperlipidemia  Chesley Mires, MD [facility] Pulmonary/Critical Care November 25, 2020, 12:13 PM

## 2020-11-13 NOTE — Progress Notes (Signed)
PCCM interval progress note:   Pt DNR and had loss of pulses and TOD at 0758, family at the bedside.   Otilio Carpen Shantrell Placzek, PA-C

## 2020-11-13 NOTE — Progress Notes (Signed)
TOD called in to [place]. Full release to funeral home. Bedside staff made aware.

## 2020-11-13 NOTE — Telephone Encounter (Signed)
FYI

## 2020-11-13 NOTE — Progress Notes (Signed)
Patient noted to be in SVT at 206. Rhythm broke on its own.  Levophed gtt stopped. Due to patients DNR status, daughter Meredith Church notified and she will be up sometime after 8 am.  Will continue to monitor.

## 2020-11-13 NOTE — Progress Notes (Signed)
TOD [id number]. Daughters x 2, CCM-NP, RT and this Therapist, sports at [facility]. Emotional support provided. Family expresses gratitude for the care their mother recvd.  Daughters given cards for pt placement and will notify them when arrangements for funeral care are made.

## 2020-11-13 DEATH — deceased

## 2020-11-22 ENCOUNTER — Ambulatory Visit: Payer: Medicare HMO | Admitting: Hematology and Oncology

## 2020-12-18 ENCOUNTER — Ambulatory Visit: Payer: Medicare HMO | Admitting: Gastroenterology

## 2021-01-16 ENCOUNTER — Telehealth (HOSPITAL_COMMUNITY): Payer: Medicare HMO | Admitting: Psychiatry

## 2021-03-17 IMAGING — DX PORTABLE CHEST - 1 VIEW
1 series · 1 of 1 positions shown · non-contrast
Comparison: Chest x-ray dated September 26, 2014.

CLINICAL DATA: Dyspnea.  Weakness.

EXAM:
PORTABLE CHEST 1 VIEW

[chest ap]
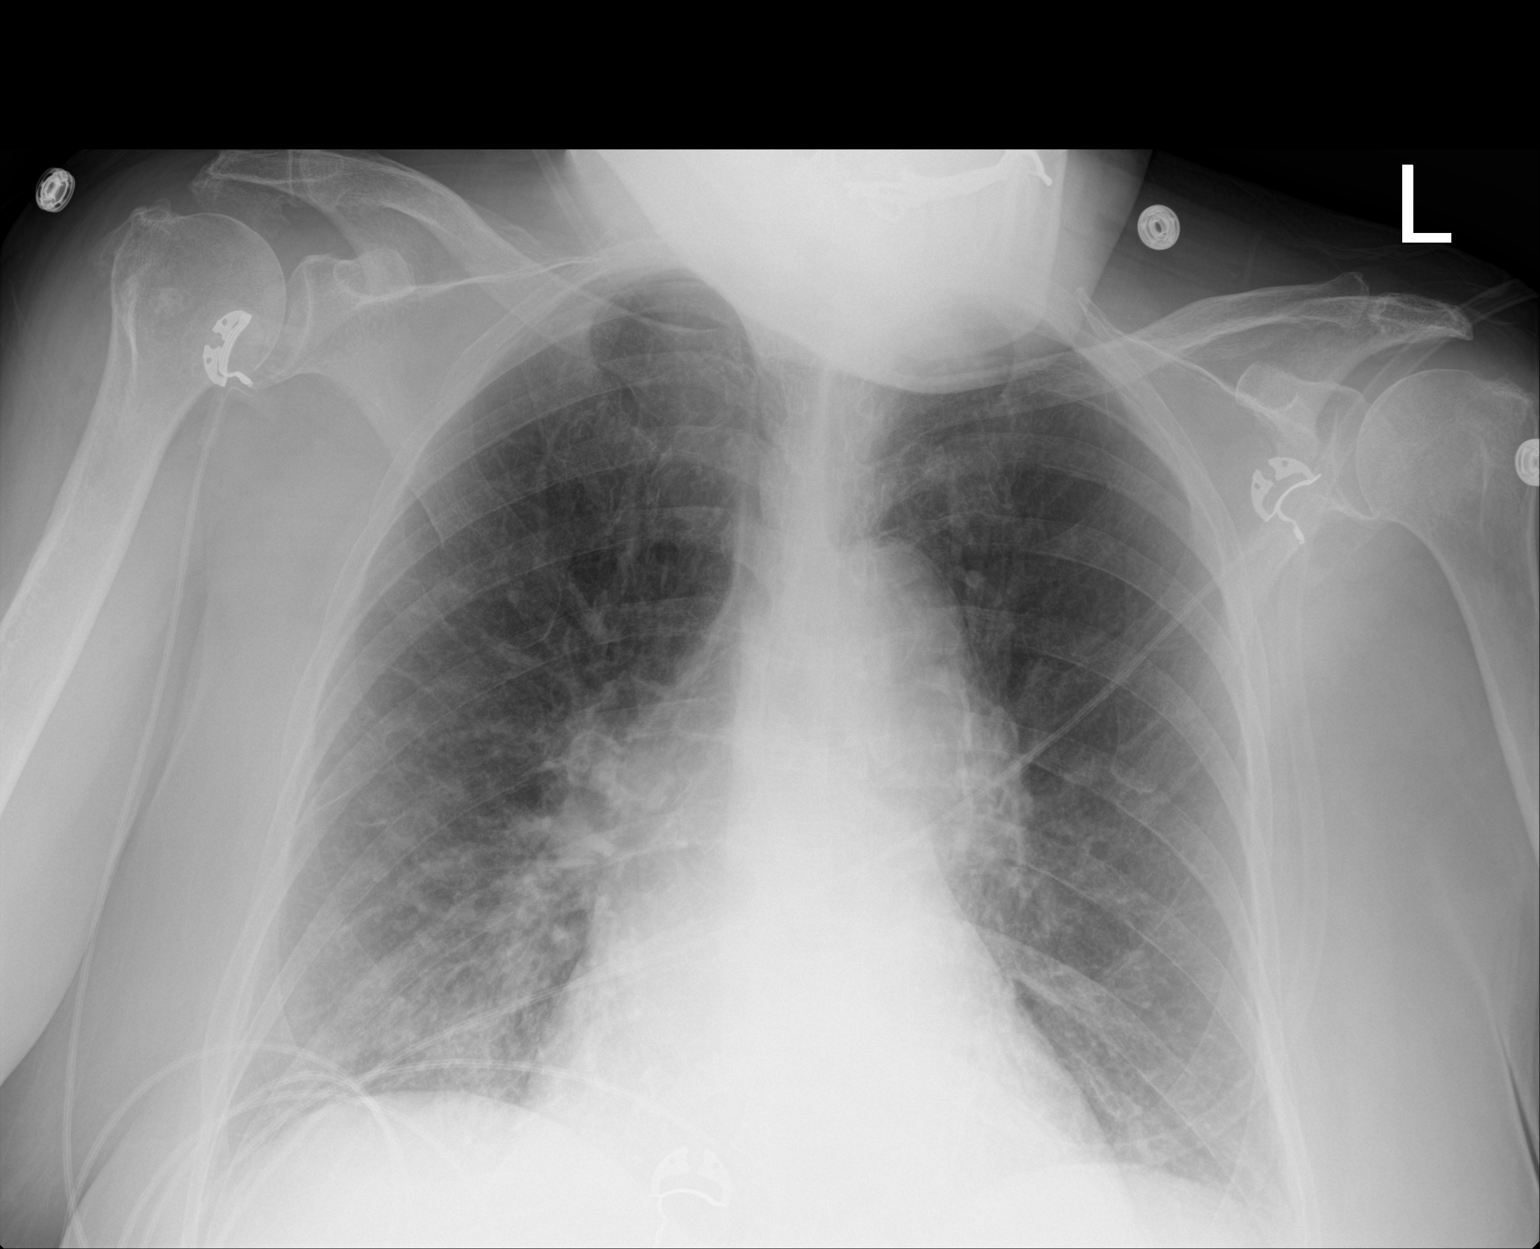

[1 of 1 positions shown; findings below may reference images not displayed]

FINDINGS: The heart size and mediastinal contours are within normal limits.
Atherosclerotic calcification of the aortic arch. Normal pulmonary
vascularity. Small opacity at the right lung base. No pleural
effusion or pneumothorax. No acute osseous abnormality.
IMPRESSION: 1. Right basilar atelectasis versus infiltrate.
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
